# Patient Record
Sex: Male | Born: 1960 | Race: Black or African American | Hispanic: No | Marital: Married | Smoking: Never smoker
Health system: Southern US, Community
[De-identification: ages and names within clinical notes are randomized; demographics above are authoritative.]

## PROBLEM LIST (undated history)

## (undated) ENCOUNTER — Emergency Department (HOSPITAL_COMMUNITY): Admission: EM | Payer: BLUE CROSS/BLUE SHIELD | Source: Home / Self Care

## (undated) DIAGNOSIS — N529 Male erectile dysfunction, unspecified: Secondary | ICD-10-CM

## (undated) DIAGNOSIS — E785 Hyperlipidemia, unspecified: Secondary | ICD-10-CM

## (undated) DIAGNOSIS — E1159 Type 2 diabetes mellitus with other circulatory complications: Secondary | ICD-10-CM

## (undated) DIAGNOSIS — R7611 Nonspecific reaction to tuberculin skin test without active tuberculosis: Secondary | ICD-10-CM

## (undated) DIAGNOSIS — Z992 Dependence on renal dialysis: Secondary | ICD-10-CM

## (undated) DIAGNOSIS — I10 Essential (primary) hypertension: Secondary | ICD-10-CM

## (undated) DIAGNOSIS — C61 Malignant neoplasm of prostate: Secondary | ICD-10-CM

## (undated) DIAGNOSIS — N186 End stage renal disease: Secondary | ICD-10-CM

## (undated) DIAGNOSIS — K219 Gastro-esophageal reflux disease without esophagitis: Secondary | ICD-10-CM

## (undated) DIAGNOSIS — N19 Unspecified kidney failure: Secondary | ICD-10-CM

## (undated) DIAGNOSIS — E669 Obesity, unspecified: Secondary | ICD-10-CM

## (undated) DIAGNOSIS — I272 Pulmonary hypertension, unspecified: Secondary | ICD-10-CM

## (undated) DIAGNOSIS — Z794 Long term (current) use of insulin: Secondary | ICD-10-CM

## (undated) DIAGNOSIS — E11319 Type 2 diabetes mellitus with unspecified diabetic retinopathy without macular edema: Secondary | ICD-10-CM

## (undated) DIAGNOSIS — N2889 Other specified disorders of kidney and ureter: Secondary | ICD-10-CM

## (undated) DIAGNOSIS — I2510 Atherosclerotic heart disease of native coronary artery without angina pectoris: Secondary | ICD-10-CM

## (undated) DIAGNOSIS — N185 Chronic kidney disease, stage 5: Secondary | ICD-10-CM

## (undated) DIAGNOSIS — D649 Anemia, unspecified: Secondary | ICD-10-CM

## (undated) DIAGNOSIS — E1122 Type 2 diabetes mellitus with diabetic chronic kidney disease: Secondary | ICD-10-CM

## (undated) DIAGNOSIS — I5022 Chronic systolic (congestive) heart failure: Secondary | ICD-10-CM

## (undated) DIAGNOSIS — G629 Polyneuropathy, unspecified: Secondary | ICD-10-CM

## (undated) DIAGNOSIS — I110 Hypertensive heart disease with heart failure: Secondary | ICD-10-CM

## (undated) DIAGNOSIS — R0600 Dyspnea, unspecified: Secondary | ICD-10-CM

## (undated) DIAGNOSIS — I428 Other cardiomyopathies: Secondary | ICD-10-CM

## (undated) DIAGNOSIS — N289 Disorder of kidney and ureter, unspecified: Secondary | ICD-10-CM

## (undated) HISTORY — DX: Chronic systolic (congestive) heart failure: I50.22

## (undated) HISTORY — DX: Other cardiomyopathies: I42.8

## (undated) HISTORY — DX: Dependence on renal dialysis: N18.6

## (undated) HISTORY — PX: KNEE CARTILAGE SURGERY: SHX688

## (undated) HISTORY — DX: Dyspnea, unspecified: R06.00

## (undated) HISTORY — DX: End stage renal disease: N18.6

## (undated) HISTORY — DX: Malignant neoplasm of prostate: C61

## (undated) HISTORY — DX: Nonspecific reaction to tuberculin skin test without active tuberculosis: R76.11

## (undated) HISTORY — DX: Atherosclerotic heart disease of native coronary artery without angina pectoris: I25.10

## (undated) HISTORY — DX: Chronic kidney disease, stage 5: N18.5

## (undated) HISTORY — DX: Other specified disorders of kidney and ureter: N28.89

## (undated) HISTORY — DX: Polyneuropathy, unspecified: G62.9

## (undated) HISTORY — DX: Type 2 diabetes mellitus with other circulatory complications: E11.59

## (undated) HISTORY — DX: Unspecified kidney failure: N19

## (undated) HISTORY — DX: Type 2 diabetes mellitus with unspecified diabetic retinopathy without macular edema: E11.319

## (undated) HISTORY — DX: Long term (current) use of insulin: Z79.4

## (undated) HISTORY — DX: Obesity, unspecified: E66.9

## (undated) HISTORY — DX: Hyperlipidemia, unspecified: E78.5

## (undated) HISTORY — PX: COLONOSCOPY: SHX174

## (undated) HISTORY — DX: Hypertensive heart disease with heart failure: I11.0

## (undated) HISTORY — DX: Disorder of kidney and ureter, unspecified: N28.9

## (undated) HISTORY — DX: Essential (primary) hypertension: I10

## (undated) HISTORY — PX: FRACTURE SURGERY: SHX138

## (undated) HISTORY — PX: VASECTOMY: SHX75

## (undated) HISTORY — DX: Male erectile dysfunction, unspecified: N52.9

## (undated) HISTORY — DX: Type 2 diabetes mellitus with diabetic chronic kidney disease: E11.22

## (undated) HISTORY — DX: Dependence on renal dialysis: Z99.2

## (undated) HISTORY — DX: End stage renal disease: Z99.2

## (undated) HISTORY — DX: Anemia, unspecified: D64.9

---

## 1997-11-23 ENCOUNTER — Encounter: Admission: RE | Admit: 1997-11-23 | Discharge: 1998-02-21 | Payer: Self-pay | Admitting: *Deleted

## 1998-12-09 ENCOUNTER — Encounter: Payer: Self-pay | Admitting: Emergency Medicine

## 1998-12-09 ENCOUNTER — Encounter: Payer: Self-pay | Admitting: Family Medicine

## 1998-12-09 ENCOUNTER — Inpatient Hospital Stay (HOSPITAL_COMMUNITY): Admission: EM | Admit: 1998-12-09 | Discharge: 1998-12-20 | Payer: Self-pay | Admitting: Emergency Medicine

## 1998-12-10 ENCOUNTER — Encounter: Payer: Self-pay | Admitting: Family Medicine

## 1998-12-12 ENCOUNTER — Encounter: Payer: Self-pay | Admitting: Infectious Diseases

## 1998-12-15 ENCOUNTER — Encounter: Payer: Self-pay | Admitting: Family Medicine

## 1999-01-12 ENCOUNTER — Encounter: Admission: RE | Admit: 1999-01-12 | Discharge: 1999-04-12 | Payer: Self-pay | Admitting: Family Medicine

## 2001-06-18 ENCOUNTER — Emergency Department (HOSPITAL_COMMUNITY): Admission: EM | Admit: 2001-06-18 | Discharge: 2001-06-18 | Payer: Self-pay | Admitting: Emergency Medicine

## 2003-10-10 ENCOUNTER — Emergency Department (HOSPITAL_COMMUNITY): Admission: EM | Admit: 2003-10-10 | Discharge: 2003-10-10 | Payer: Self-pay | Admitting: Internal Medicine

## 2005-07-15 ENCOUNTER — Emergency Department (HOSPITAL_COMMUNITY): Admission: EM | Admit: 2005-07-15 | Discharge: 2005-07-15 | Payer: Self-pay | Admitting: Family Medicine

## 2005-07-19 ENCOUNTER — Ambulatory Visit: Payer: Self-pay | Admitting: Family Medicine

## 2006-06-06 ENCOUNTER — Ambulatory Visit: Payer: Self-pay | Admitting: Family Medicine

## 2006-07-05 ENCOUNTER — Ambulatory Visit: Payer: Self-pay | Admitting: Family Medicine

## 2006-11-28 ENCOUNTER — Ambulatory Visit: Payer: Self-pay | Admitting: Family Medicine

## 2006-11-29 ENCOUNTER — Ambulatory Visit: Payer: Self-pay | Admitting: Family Medicine

## 2007-07-25 ENCOUNTER — Ambulatory Visit: Payer: Self-pay | Admitting: Family Medicine

## 2007-08-08 ENCOUNTER — Ambulatory Visit: Payer: Self-pay | Admitting: Family Medicine

## 2007-08-13 ENCOUNTER — Ambulatory Visit: Payer: Self-pay | Admitting: Family Medicine

## 2007-08-13 ENCOUNTER — Encounter: Admission: RE | Admit: 2007-08-13 | Discharge: 2007-08-13 | Payer: Self-pay | Admitting: Family Medicine

## 2007-11-27 ENCOUNTER — Ambulatory Visit: Payer: Self-pay | Admitting: Family Medicine

## 2007-12-10 ENCOUNTER — Ambulatory Visit: Payer: Self-pay | Admitting: Family Medicine

## 2008-01-30 ENCOUNTER — Encounter: Admission: RE | Admit: 2008-01-30 | Discharge: 2008-01-30 | Payer: Self-pay | Admitting: Family Medicine

## 2008-04-29 ENCOUNTER — Ambulatory Visit: Payer: Self-pay | Admitting: Family Medicine

## 2008-05-04 ENCOUNTER — Ambulatory Visit: Payer: Self-pay | Admitting: Family Medicine

## 2008-10-28 ENCOUNTER — Ambulatory Visit: Payer: Self-pay | Admitting: Family Medicine

## 2008-11-04 ENCOUNTER — Ambulatory Visit: Payer: Self-pay | Admitting: Family Medicine

## 2008-12-03 ENCOUNTER — Ambulatory Visit: Payer: Self-pay | Admitting: Family Medicine

## 2008-12-11 ENCOUNTER — Ambulatory Visit: Payer: Self-pay | Admitting: Family Medicine

## 2008-12-23 ENCOUNTER — Ambulatory Visit: Payer: Self-pay | Admitting: Family Medicine

## 2009-01-01 ENCOUNTER — Ambulatory Visit: Payer: Self-pay | Admitting: Family Medicine

## 2009-02-11 ENCOUNTER — Ambulatory Visit: Payer: Self-pay | Admitting: Family Medicine

## 2009-02-13 HISTORY — PX: PROSTATECTOMY: SHX69

## 2009-02-17 ENCOUNTER — Ambulatory Visit: Payer: Self-pay | Admitting: Family Medicine

## 2009-04-07 ENCOUNTER — Ambulatory Visit: Payer: Self-pay | Admitting: Family Medicine

## 2009-12-01 ENCOUNTER — Ambulatory Visit (HOSPITAL_COMMUNITY): Admission: RE | Admit: 2009-12-01 | Discharge: 2009-12-01 | Payer: Self-pay | Admitting: Urology

## 2009-12-06 ENCOUNTER — Ambulatory Visit: Payer: Self-pay | Admitting: Family Medicine

## 2010-01-21 ENCOUNTER — Encounter (INDEPENDENT_AMBULATORY_CARE_PROVIDER_SITE_OTHER): Payer: Self-pay | Admitting: Urology

## 2010-01-21 ENCOUNTER — Inpatient Hospital Stay (HOSPITAL_COMMUNITY)
Admission: RE | Admit: 2010-01-21 | Discharge: 2010-01-22 | Payer: Self-pay | Source: Home / Self Care | Attending: Urology | Admitting: Urology

## 2010-04-12 ENCOUNTER — Ambulatory Visit (INDEPENDENT_AMBULATORY_CARE_PROVIDER_SITE_OTHER): Payer: 59 | Admitting: Family Medicine

## 2010-04-12 DIAGNOSIS — L0390 Cellulitis, unspecified: Secondary | ICD-10-CM

## 2010-04-26 LAB — COMPREHENSIVE METABOLIC PANEL
AST: 28 U/L (ref 0–37)
BUN: 20 mg/dL (ref 6–23)
CO2: 28 mEq/L (ref 19–32)
Calcium: 9.9 mg/dL (ref 8.4–10.5)
Chloride: 100 mEq/L (ref 96–112)
Creatinine, Ser: 1.24 mg/dL (ref 0.4–1.5)
GFR calc non Af Amer: >60 mL/min (ref >60)
Glucose, Bld: 66 mg/dL — ABNORMAL LOW (ref 70–99)
Total Bilirubin: 0.6 mg/dL (ref 0.3–1.2)

## 2010-04-26 LAB — BASIC METABOLIC PANEL
BUN: 11 mg/dL (ref 6–23)
CO2: 26 mEq/L (ref 19–32)
Calcium: 8.2 mg/dL — ABNORMAL LOW (ref 8.4–10.5)
Calcium: 8.4 mg/dL (ref 8.4–10.5)
GFR calc Af Amer: >60 mL/min (ref >60)
GFR calc non Af Amer: 54 mL/min — ABNORMAL LOW (ref >60)
GFR calc non Af Amer: >60 mL/min (ref >60)
Glucose, Bld: 144 mg/dL — ABNORMAL HIGH (ref 70–99)
Glucose, Bld: 192 mg/dL — ABNORMAL HIGH (ref 70–99)
Potassium: 4.1 mEq/L (ref 3.5–5.1)
Sodium: 136 mEq/L (ref 135–145)

## 2010-04-26 LAB — GLUCOSE, CAPILLARY
Glucose-Capillary: 162 mg/dL — ABNORMAL HIGH (ref 70–99)
Glucose-Capillary: 162 mg/dL — ABNORMAL HIGH (ref 70–99)
Glucose-Capillary: 165 mg/dL — ABNORMAL HIGH (ref 70–99)
Glucose-Capillary: 183 mg/dL — ABNORMAL HIGH (ref 70–99)
Glucose-Capillary: 196 mg/dL — ABNORMAL HIGH (ref 70–99)
Glucose-Capillary: 214 mg/dL — ABNORMAL HIGH (ref 70–99)

## 2010-04-26 LAB — CBC
HCT: 34 % — ABNORMAL LOW (ref 39.0–52.0)
HCT: 39.4 % (ref 39.0–52.0)
Hemoglobin: 10.9 g/dL — ABNORMAL LOW (ref 13.0–17.0)
Hemoglobin: 13.2 g/dL (ref 13.0–17.0)
MCH: 24.8 pg — ABNORMAL LOW (ref 26.0–34.0)
MCH: 25.2 pg — ABNORMAL LOW (ref 26.0–34.0)
MCHC: 31.8 g/dL (ref 30.0–36.0)
MCHC: 33.5 g/dL (ref 30.0–36.0)
MCV: 75.2 fL — ABNORMAL LOW (ref 78.0–100.0)
Platelets: 244 10*3/uL (ref 150–400)
RBC: 4.4 MIL/uL (ref 4.22–5.81)
RBC: 5.24 MIL/uL (ref 4.22–5.81)
RDW: 15 % (ref 11.5–15.5)
WBC: 9.7 10*3/uL (ref 4.0–10.5)

## 2010-04-26 LAB — ABO/RH: ABO/RH(D): A POS

## 2010-04-26 LAB — DIFFERENTIAL
Basophils Absolute: 0 10*3/uL (ref 0.0–0.1)
Basophils Relative: 0 % (ref 0–1)
Lymphocytes Relative: 20 % (ref 12–46)
Monocytes Absolute: 0.8 10*3/uL (ref 0.1–1.0)
Neutro Abs: 6.8 10*3/uL (ref 1.7–7.7)
Neutrophils Relative %: 70 % (ref 43–77)

## 2010-04-26 LAB — TYPE AND SCREEN: ABO/RH(D): A POS

## 2010-04-26 LAB — SURGICAL PCR SCREEN: Staphylococcus aureus: NEGATIVE

## 2010-04-27 LAB — CBC
HCT: 39.7 % (ref 39.0–52.0)
Hemoglobin: 13.2 g/dL (ref 13.0–17.0)
MCH: 25.5 pg — ABNORMAL LOW (ref 26.0–34.0)
RBC: 5.17 MIL/uL (ref 4.22–5.81)

## 2010-04-27 LAB — COMPREHENSIVE METABOLIC PANEL
Albumin: 3.1 g/dL — ABNORMAL LOW (ref 3.5–5.2)
BUN: 22 mg/dL (ref 6–23)
Calcium: 9.4 mg/dL (ref 8.4–10.5)
Chloride: 90 mEq/L — ABNORMAL LOW (ref 96–112)
Creatinine, Ser: 1.63 mg/dL — ABNORMAL HIGH (ref 0.4–1.5)
Total Bilirubin: 0.9 mg/dL (ref 0.3–1.2)

## 2010-04-27 LAB — SURGICAL PCR SCREEN
MRSA, PCR: NEGATIVE
Staphylococcus aureus: NEGATIVE

## 2010-07-04 ENCOUNTER — Ambulatory Visit
Admission: RE | Admit: 2010-07-04 | Discharge: 2010-07-04 | Disposition: A | Discharge status: Home / Self Care | Payer: Self-pay | Source: Ambulatory Visit | Attending: Radiation Oncology | Admitting: Radiation Oncology

## 2010-07-04 DIAGNOSIS — E7800 Pure hypercholesterolemia, unspecified: Secondary | ICD-10-CM | POA: Insufficient documentation

## 2010-07-04 DIAGNOSIS — Z7982 Long term (current) use of aspirin: Secondary | ICD-10-CM | POA: Insufficient documentation

## 2010-07-04 DIAGNOSIS — C61 Malignant neoplasm of prostate: Secondary | ICD-10-CM | POA: Insufficient documentation

## 2010-07-04 DIAGNOSIS — I10 Essential (primary) hypertension: Secondary | ICD-10-CM | POA: Insufficient documentation

## 2010-07-04 DIAGNOSIS — E119 Type 2 diabetes mellitus without complications: Secondary | ICD-10-CM | POA: Insufficient documentation

## 2010-07-04 DIAGNOSIS — Z79899 Other long term (current) drug therapy: Secondary | ICD-10-CM | POA: Insufficient documentation

## 2010-07-04 DIAGNOSIS — M129 Arthropathy, unspecified: Secondary | ICD-10-CM | POA: Insufficient documentation

## 2010-07-04 DIAGNOSIS — R32 Unspecified urinary incontinence: Secondary | ICD-10-CM | POA: Insufficient documentation

## 2010-07-05 ENCOUNTER — Telehealth: Payer: Self-pay | Admitting: Family Medicine

## 2010-07-05 NOTE — Telephone Encounter (Signed)
Pt called, lost job, requested any samples.  Per [employer] gave samples Exforge 5/320, Crestor 20mg , Humalog  & Levimer-LM

## 2010-09-20 ENCOUNTER — Other Ambulatory Visit: Payer: Self-pay | Admitting: Family Medicine

## 2010-09-22 ENCOUNTER — Telehealth: Payer: Self-pay | Admitting: Family Medicine

## 2010-09-22 NOTE — Telephone Encounter (Signed)
Help him out if we can

## 2010-09-22 NOTE — Telephone Encounter (Signed)
Pt called needs samples of Crestor, Exforge, Humalog, Levamir.  No insurance, no job.

## 2010-10-03 ENCOUNTER — Ambulatory Visit
Admission: RE | Admit: 2010-10-03 | Discharge: 2010-10-03 | Disposition: A | Discharge status: Home / Self Care | Payer: Self-pay | Source: Ambulatory Visit | Attending: Radiation Oncology | Admitting: Radiation Oncology

## 2010-10-03 DIAGNOSIS — C61 Malignant neoplasm of prostate: Secondary | ICD-10-CM | POA: Insufficient documentation

## 2010-10-10 ENCOUNTER — Encounter: Payer: Self-pay | Admitting: Family Medicine

## 2010-10-21 ENCOUNTER — Telehealth: Payer: Self-pay | Admitting: Family Medicine

## 2010-10-21 NOTE — Telephone Encounter (Signed)
Med in fridge and we dont have exforge

## 2010-10-21 NOTE — Telephone Encounter (Signed)
Pt would like samples of humalog/crestor/exforge/levemir. Please leave what we have on back door at end of day. Pt will will not be able to pick up until after work hours.

## 2010-12-27 ENCOUNTER — Telehealth: Payer: Self-pay | Admitting: Internal Medicine

## 2010-12-27 NOTE — Telephone Encounter (Signed)
Pt will come in and pick up samples

## 2010-12-27 NOTE — Telephone Encounter (Signed)
Pt would like to know if he could have samples of humalog insulin and levemir insulin. He lost his job and can't afford them and has no insurance

## 2010-12-27 NOTE — Telephone Encounter (Signed)
Give him some insulin if we have any

## 2011-02-10 ENCOUNTER — Telehealth: Payer: Self-pay | Admitting: Family Medicine

## 2011-02-16 NOTE — Telephone Encounter (Signed)
Pt given samples  

## 2011-02-17 ENCOUNTER — Other Ambulatory Visit: Payer: Self-pay

## 2011-02-17 MED ORDER — INSULIN DETEMIR 100 UNIT/ML ~~LOC~~ SOLN: 50.0000 [IU] | Freq: Two times a day (BID) | SUBCUTANEOUS | Status: DC

## 2011-03-14 ENCOUNTER — Encounter: Payer: Self-pay | Admitting: Family Medicine

## 2011-03-14 ENCOUNTER — Ambulatory Visit (INDEPENDENT_AMBULATORY_CARE_PROVIDER_SITE_OTHER): Payer: Self-pay | Admitting: Family Medicine

## 2011-03-14 DIAGNOSIS — E119 Type 2 diabetes mellitus without complications: Secondary | ICD-10-CM

## 2011-03-14 LAB — BASIC METABOLIC PANEL
BUN: 21 mg/dL (ref 6–23)
Chloride: 89 mEq/L — ABNORMAL LOW (ref 96–112)
Potassium: 4.9 mEq/L (ref 3.5–5.3)

## 2011-03-14 NOTE — Progress Notes (Signed)
  Subjective:    Patient ID: Austin Kramer, male    DOB: 07/24/1960, 51 y.o.   MRN: [id number]  HPI He is here for evaluation of diabetes. He has been out of job for several months and has been cutting back on his insulin dosing. Over last several weeks he has cut back extensively and in the last week has essentially run out. He does complain of polyuria and polydipsia.   Review of Systems     Objective:   Physical Exam Alert and in no distress. No tachypnea is noted. Fingerstick blood sugar was over 500. A segment of bile did show a blood sugar of 673. Sodium 129, potassium 4.9, CO2 30, chloride 89. Renal function is normal.       Assessment & Plan:   1. Diabetes mellitus  Basic Metabolic Panel (BMET)   I had a long discussion with him concerning his diabetes and insulin. We will attempt to get him medications a less expensive price. He is to start back on his Levemir and regular insulin. He is keeping active his blood sugars and call me in one week.

## 2011-03-15 ENCOUNTER — Encounter: Payer: Self-pay | Admitting: *Deleted

## 2011-03-15 NOTE — Progress Notes (Signed)
I-PPS 07/04/10=[id number] score=14 IIEF 5/21/112=[id number]

## 2011-03-27 ENCOUNTER — Encounter: Payer: Self-pay | Admitting: Family Medicine

## 2011-03-27 ENCOUNTER — Ambulatory Visit (INDEPENDENT_AMBULATORY_CARE_PROVIDER_SITE_OTHER): Payer: Self-pay | Admitting: Family Medicine

## 2011-03-27 DIAGNOSIS — E1159 Type 2 diabetes mellitus with other circulatory complications: Secondary | ICD-10-CM

## 2011-03-27 DIAGNOSIS — E785 Hyperlipidemia, unspecified: Secondary | ICD-10-CM

## 2011-03-27 DIAGNOSIS — I152 Hypertension secondary to endocrine disorders: Secondary | ICD-10-CM

## 2011-03-27 DIAGNOSIS — E669 Obesity, unspecified: Secondary | ICD-10-CM | POA: Insufficient documentation

## 2011-03-27 DIAGNOSIS — I10 Essential (primary) hypertension: Secondary | ICD-10-CM

## 2011-03-27 DIAGNOSIS — E1122 Type 2 diabetes mellitus with diabetic chronic kidney disease: Secondary | ICD-10-CM

## 2011-03-27 DIAGNOSIS — E1169 Type 2 diabetes mellitus with other specified complication: Secondary | ICD-10-CM

## 2011-03-27 DIAGNOSIS — E119 Type 2 diabetes mellitus without complications: Secondary | ICD-10-CM

## 2011-03-27 HISTORY — DX: Type 2 diabetes mellitus with diabetic chronic kidney disease: E11.22

## 2011-03-27 HISTORY — DX: Type 2 diabetes mellitus with other circulatory complications: E11.59

## 2011-03-27 HISTORY — DX: Hyperlipidemia, unspecified: E78.5

## 2011-03-27 HISTORY — DX: Hypertension secondary to endocrine disorders: I15.2

## 2011-03-27 NOTE — Progress Notes (Signed)
  Subjective:    Patient ID: Austin Kramer, male    DOB: 1960-11-03, 51 y.o.   MRN: [id number]  HPI He is here for recheck. He is now back on his left ear and Humalog. In the last several days his blood sugars have finally gotten below 200. Helped through [employer].   Review of Systems     Objective:   Physical Exam Alert and in no distress otherwise not examined       Assessment & Plan:   1. Diabetes mellitus   2. Hypertension associated with diabetes   3. Hyperlipidemia LDL goal <70   4. Obesity (BMI 30-39.9)    he will continue to work on getting his insulin dosing. He is to call if he runs out to see if we have any samples. Otherwise I will see him in 4 months

## 2011-04-16 ENCOUNTER — Other Ambulatory Visit: Payer: Self-pay | Admitting: Family Medicine

## 2011-04-17 NOTE — Telephone Encounter (Signed)
Is this ok?

## 2011-05-01 ENCOUNTER — Other Ambulatory Visit: Payer: Self-pay

## 2011-05-01 MED ORDER — INSULIN LISPRO 100 UNIT/ML ~~LOC~~ SOLN: 25.0000 [IU] | Freq: Every day | SUBCUTANEOUS | Status: DC

## 2011-05-01 MED ORDER — INSULIN DETEMIR 100 UNIT/ML ~~LOC~~ SOLN: 50.0000 [IU] | Freq: Two times a day (BID) | SUBCUTANEOUS | Status: DC

## 2011-05-01 MED ORDER — ROSUVASTATIN CALCIUM 20 MG PO TABS: 20.0000 mg | ORAL_TABLET | Freq: Every day | ORAL | Status: DC

## 2011-05-26 ENCOUNTER — Telehealth: Payer: Self-pay | Admitting: Family Medicine

## 2011-05-26 MED ORDER — INSULIN DETEMIR 100 UNIT/ML ~~LOC~~ SOLN: 50.0000 [IU] | Freq: Two times a day (BID) | SUBCUTANEOUS | Status: DC

## 2011-05-26 NOTE — Telephone Encounter (Signed)
Called patient and let him know that we have 2 pens, I will leave at the back door for him.

## 2011-05-26 NOTE — Telephone Encounter (Signed)
Wants Levamir samples.   Please call.   Please leave on back door if we door

## 2011-05-26 NOTE — Telephone Encounter (Signed)
If we had been Levemir he can have an

## 2011-06-07 ENCOUNTER — Other Ambulatory Visit: Payer: Self-pay

## 2011-06-07 MED ORDER — INSULIN DETEMIR 100 UNIT/ML ~~LOC~~ SOLN: 50.0000 [IU] | Freq: Two times a day (BID) | SUBCUTANEOUS | Status: DC

## 2011-06-07 MED ORDER — INSULIN LISPRO 100 UNIT/ML ~~LOC~~ SOLN: 25.0000 [IU] | Freq: Every day | SUBCUTANEOUS | Status: DC

## 2011-06-07 NOTE — Telephone Encounter (Signed)
Pt informed samples in refrige.

## 2011-06-23 ENCOUNTER — Other Ambulatory Visit: Payer: Self-pay

## 2011-06-23 MED ORDER — INSULIN LISPRO 100 UNIT/ML ~~LOC~~ SOLN: 25.0000 [IU] | Freq: Every day | SUBCUTANEOUS | Status: DC

## 2011-06-23 MED ORDER — INSULIN DETEMIR 100 UNIT/ML ~~LOC~~ SOLN: 50.0000 [IU] | Freq: Two times a day (BID) | SUBCUTANEOUS | Status: DC

## 2011-06-23 NOTE — Telephone Encounter (Signed)
Gave samples due to no ins.

## 2011-08-03 ENCOUNTER — Other Ambulatory Visit: Payer: Self-pay

## 2011-08-03 MED ORDER — INSULIN DETEMIR 100 UNIT/ML ~~LOC~~ SOLN: 50.0000 [IU] | Freq: Two times a day (BID) | SUBCUTANEOUS | Status: DC

## 2011-08-03 MED ORDER — INSULIN LISPRO 100 UNIT/ML ~~LOC~~ SOLN: 25.0000 [IU] | Freq: Every day | SUBCUTANEOUS | Status: DC

## 2011-08-03 MED ORDER — ROSUVASTATIN CALCIUM 20 MG PO TABS: 20.0000 mg | ORAL_TABLET | Freq: Every day | ORAL | Status: DC

## 2011-09-11 ENCOUNTER — Encounter: Payer: Self-pay | Admitting: Family Medicine

## 2011-09-11 ENCOUNTER — Ambulatory Visit (INDEPENDENT_AMBULATORY_CARE_PROVIDER_SITE_OTHER): Payer: Self-pay | Admitting: Family Medicine

## 2011-09-11 VITALS — BP 150/90 | HR 106 | Wt 276.0 lb

## 2011-09-11 DIAGNOSIS — E1142 Type 2 diabetes mellitus with diabetic polyneuropathy: Secondary | ICD-10-CM

## 2011-09-11 DIAGNOSIS — E1140 Type 2 diabetes mellitus with diabetic neuropathy, unspecified: Secondary | ICD-10-CM

## 2011-09-11 DIAGNOSIS — E785 Hyperlipidemia, unspecified: Secondary | ICD-10-CM

## 2011-09-11 DIAGNOSIS — E1149 Type 2 diabetes mellitus with other diabetic neurological complication: Secondary | ICD-10-CM

## 2011-09-11 DIAGNOSIS — E669 Obesity, unspecified: Secondary | ICD-10-CM

## 2011-09-11 DIAGNOSIS — I10 Essential (primary) hypertension: Secondary | ICD-10-CM

## 2011-09-11 DIAGNOSIS — E119 Type 2 diabetes mellitus without complications: Secondary | ICD-10-CM

## 2011-09-11 DIAGNOSIS — E1169 Type 2 diabetes mellitus with other specified complication: Secondary | ICD-10-CM

## 2011-09-11 MED ORDER — AMITRIPTYLINE HCL 10 MG PO TABS: 10.0000 mg | ORAL_TABLET | Freq: Every day | ORAL | Status: DC

## 2011-09-11 NOTE — Progress Notes (Signed)
  Subjective:    Patient ID: Austin Kramer, male    DOB: November 16, 1960, 51 y.o.   MRN: [id number]  HPI He has a two-week history of difficulty with tingling in his feet. No pain or weakness. He continues on medications listed in the chart and he is having no difficulty from him.Marland Kitchen He does state that when he sees his blood sugar up to high, he will give himself extra Levemir which she then states does tend to drop significantly. He is in between jobs right now. His social and family history were reviewed.   Review of Systems     Objective:   Physical Exam Alert and in no distress otherwise not examined. He will A1c is 10.2.      Assessment & Plan:   1. Diabetes mellitus    2. Hyperlipidemia LDL goal <70    3. Hypertension associated with diabetes    4. Obesity (BMI 30-39.9)    5. Diabetic neuropathy, type II diabetes mellitus  amitriptyline (ELAVIL) 10 MG tablet  Keep your morning blood sugars between 100 to120. Do not adjust  the Levimer except on these numbers. If you have elevated numbers during the day then you can use the Humalog

## 2011-09-11 NOTE — Patient Instructions (Addendum)
Keep your morning blood sugars between 100 to120. Do not adjust of the [place] except on these numbers. If you have elevated numbers during the day then you can use the Humalog

## 2011-10-26 ENCOUNTER — Other Ambulatory Visit: Payer: Self-pay

## 2011-10-26 MED ORDER — ROSUVASTATIN CALCIUM 20 MG PO TABS: 20.0000 mg | ORAL_TABLET | Freq: Every day | ORAL | Status: DC

## 2011-10-26 MED ORDER — INSULIN LISPRO 100 UNIT/ML ~~LOC~~ SOLN: 25.0000 [IU] | Freq: Every day | SUBCUTANEOUS | Status: DC

## 2011-10-26 NOTE — Telephone Encounter (Signed)
Med on back porch for pt

## 2011-12-26 ENCOUNTER — Telehealth: Payer: Self-pay | Admitting: Family Medicine

## 2011-12-26 MED ORDER — INSULIN LISPRO 100 UNIT/ML ~~LOC~~ SOLN: 25.0000 [IU] | Freq: Every day | SUBCUTANEOUS | Status: DC

## 2011-12-26 MED ORDER — INSULIN DETEMIR 100 UNIT/ML ~~LOC~~ SOLN: 50.0000 [IU] | Freq: Two times a day (BID) | SUBCUTANEOUS | Status: DC

## 2011-12-26 NOTE — Telephone Encounter (Signed)
Please call concerning medicine, he still has no insurance and wants to see if we have samples   Wants samples of levimir,and  humalog

## 2011-12-26 NOTE — Telephone Encounter (Signed)
PT ADVISED WE HAVE Samples for him

## 2012-01-10 ENCOUNTER — Encounter (HOSPITAL_COMMUNITY): Payer: Self-pay

## 2012-01-10 ENCOUNTER — Observation Stay (HOSPITAL_COMMUNITY): Payer: No Typology Code available for payment source

## 2012-01-10 ENCOUNTER — Inpatient Hospital Stay (HOSPITAL_COMMUNITY)
Admission: EM | Admit: 2012-01-10 | Discharge: 2012-01-12 | DRG: 603 | Disposition: A | Discharge status: Home / Self Care | Payer: No Typology Code available for payment source | Attending: Internal Medicine | Admitting: Internal Medicine

## 2012-01-10 DIAGNOSIS — L0390 Cellulitis, unspecified: Secondary | ICD-10-CM | POA: Diagnosis present

## 2012-01-10 DIAGNOSIS — E1149 Type 2 diabetes mellitus with other diabetic neurological complication: Secondary | ICD-10-CM | POA: Diagnosis present

## 2012-01-10 DIAGNOSIS — D72829 Elevated white blood cell count, unspecified: Secondary | ICD-10-CM | POA: Diagnosis present

## 2012-01-10 DIAGNOSIS — E1122 Type 2 diabetes mellitus with diabetic chronic kidney disease: Secondary | ICD-10-CM | POA: Diagnosis present

## 2012-01-10 DIAGNOSIS — IMO0002 Reserved for concepts with insufficient information to code with codable children: Secondary | ICD-10-CM | POA: Diagnosis present

## 2012-01-10 DIAGNOSIS — Z9079 Acquired absence of other genital organ(s): Secondary | ICD-10-CM

## 2012-01-10 DIAGNOSIS — Z79899 Other long term (current) drug therapy: Secondary | ICD-10-CM

## 2012-01-10 DIAGNOSIS — E785 Hyperlipidemia, unspecified: Secondary | ICD-10-CM

## 2012-01-10 DIAGNOSIS — L0291 Cutaneous abscess, unspecified: Secondary | ICD-10-CM

## 2012-01-10 DIAGNOSIS — Z8546 Personal history of malignant neoplasm of prostate: Secondary | ICD-10-CM

## 2012-01-10 DIAGNOSIS — N289 Disorder of kidney and ureter, unspecified: Secondary | ICD-10-CM

## 2012-01-10 DIAGNOSIS — E1159 Type 2 diabetes mellitus with other circulatory complications: Secondary | ICD-10-CM | POA: Diagnosis present

## 2012-01-10 DIAGNOSIS — E1169 Type 2 diabetes mellitus with other specified complication: Secondary | ICD-10-CM

## 2012-01-10 DIAGNOSIS — E1165 Type 2 diabetes mellitus with hyperglycemia: Secondary | ICD-10-CM | POA: Diagnosis present

## 2012-01-10 DIAGNOSIS — M86679 Other chronic osteomyelitis, unspecified ankle and foot: Secondary | ICD-10-CM | POA: Diagnosis present

## 2012-01-10 DIAGNOSIS — E119 Type 2 diabetes mellitus without complications: Secondary | ICD-10-CM

## 2012-01-10 DIAGNOSIS — I10 Essential (primary) hypertension: Secondary | ICD-10-CM | POA: Diagnosis present

## 2012-01-10 DIAGNOSIS — E1142 Type 2 diabetes mellitus with diabetic polyneuropathy: Secondary | ICD-10-CM | POA: Diagnosis present

## 2012-01-10 DIAGNOSIS — M9080 Osteopathy in diseases classified elsewhere, unspecified site: Secondary | ICD-10-CM | POA: Diagnosis present

## 2012-01-10 DIAGNOSIS — E669 Obesity, unspecified: Secondary | ICD-10-CM | POA: Diagnosis present

## 2012-01-10 DIAGNOSIS — Z794 Long term (current) use of insulin: Secondary | ICD-10-CM

## 2012-01-10 DIAGNOSIS — E876 Hypokalemia: Secondary | ICD-10-CM

## 2012-01-10 DIAGNOSIS — N179 Acute kidney failure, unspecified: Secondary | ICD-10-CM | POA: Diagnosis present

## 2012-01-10 DIAGNOSIS — E871 Hypo-osmolality and hyponatremia: Secondary | ICD-10-CM

## 2012-01-10 DIAGNOSIS — E1140 Type 2 diabetes mellitus with diabetic neuropathy, unspecified: Secondary | ICD-10-CM

## 2012-01-10 DIAGNOSIS — L02619 Cutaneous abscess of unspecified foot: Principal | ICD-10-CM | POA: Diagnosis present

## 2012-01-10 DIAGNOSIS — Z6836 Body mass index (BMI) 36.0-36.9, adult: Secondary | ICD-10-CM

## 2012-01-10 LAB — BASIC METABOLIC PANEL
BUN: 19 mg/dL (ref 6–23)
Calcium: 8.9 mg/dL (ref 8.4–10.5)
Creatinine, Ser: 1.35 mg/dL (ref 0.50–1.35)
GFR calc non Af Amer: 59 mL/min — ABNORMAL LOW (ref >90)
Glucose, Bld: 195 mg/dL — ABNORMAL HIGH (ref 70–99)
Sodium: 135 mEq/L (ref 135–145)

## 2012-01-10 LAB — CBC WITH DIFFERENTIAL/PLATELET
Basophils Relative: 0 % (ref 0–1)
Eosinophils Absolute: 0 10*3/uL (ref 0.0–0.7)
Hemoglobin: 11.9 g/dL — ABNORMAL LOW (ref 13.0–17.0)
Lymphs Abs: 1.5 10*3/uL (ref 0.7–4.0)
MCH: 24.3 pg — ABNORMAL LOW (ref 26.0–34.0)
MCHC: 33.4 g/dL (ref 30.0–36.0)
MCV: 72.8 fL — ABNORMAL LOW (ref 78.0–100.0)
Monocytes Absolute: 0.9 10*3/uL (ref 0.1–1.0)
Neutrophils Relative %: 82 % — ABNORMAL HIGH (ref 43–77)

## 2012-01-10 LAB — GLUCOSE, CAPILLARY
Glucose-Capillary: 195 mg/dL — ABNORMAL HIGH (ref 70–99)
Glucose-Capillary: 210 mg/dL — ABNORMAL HIGH (ref 70–99)

## 2012-01-10 MED ORDER — AMLODIPINE BESYLATE-VALSARTAN 5-320 MG PO TABS: 1.0000 | ORAL_TABLET | Freq: Every day | ORAL | Status: DC

## 2012-01-10 MED ORDER — FERROUS SULFATE 325 (65 FE) MG PO TABS
325.0000 mg | ORAL_TABLET | Freq: Every day | ORAL | Status: DC
Start: 2012-01-11 — End: 2012-01-12
  Administered 2012-01-11 – 2012-01-12 (×2): 325 mg via ORAL
  Filled 2012-01-10 (×4): qty 1

## 2012-01-10 MED ORDER — VANCOMYCIN HCL 1000 MG IV SOLR
1250.0000 mg | Freq: Two times a day (BID) | INTRAVENOUS | Status: DC
  Administered 2012-01-11 – 2012-01-12 (×3): 1250 mg via INTRAVENOUS
  Filled 2012-01-10 (×4): qty 1250

## 2012-01-10 MED ORDER — IRBESARTAN 300 MG PO TABS
300.0000 mg | ORAL_TABLET | Freq: Every day | ORAL | Status: DC
  Administered 2012-01-10: 300 mg via ORAL
  Filled 2012-01-10 (×2): qty 1

## 2012-01-10 MED ORDER — INSULIN ASPART 100 UNIT/ML ~~LOC~~ SOLN
4.0000 [IU] | Freq: Three times a day (TID) | SUBCUTANEOUS | Status: DC
  Administered 2012-01-11 – 2012-01-12 (×4): 4 [IU] via SUBCUTANEOUS

## 2012-01-10 MED ORDER — PNEUMOCOCCAL VAC POLYVALENT 25 MCG/0.5ML IJ INJ
0.5000 mL | INJECTION | INTRAMUSCULAR | Status: AC
  Administered 2012-01-11: 0.5 mL via INTRAMUSCULAR
  Filled 2012-01-10: qty 0.5

## 2012-01-10 MED ORDER — IBUPROFEN 800 MG PO TABS: 400.0000 mg | ORAL_TABLET | Freq: Three times a day (TID) | ORAL | Status: DC | PRN

## 2012-01-10 MED ORDER — INSULIN ASPART 100 UNIT/ML ~~LOC~~ SOLN
0.0000 [IU] | Freq: Every day | SUBCUTANEOUS | Status: DC
  Administered 2012-01-10: 2 [IU] via SUBCUTANEOUS

## 2012-01-10 MED ORDER — CLINDAMYCIN PHOSPHATE 600 MG/50ML IV SOLN
600.0000 mg | Freq: Once | INTRAVENOUS | Status: AC
  Administered 2012-01-10: 600 mg via INTRAVENOUS
  Filled 2012-01-10: qty 50

## 2012-01-10 MED ORDER — ONDANSETRON HCL 4 MG/2ML IJ SOLN: 4.0000 mg | Freq: Four times a day (QID) | INTRAMUSCULAR | Status: DC | PRN

## 2012-01-10 MED ORDER — SODIUM CHLORIDE 0.9 % IV SOLN: 250.0000 mL | INTRAVENOUS | Status: DC | PRN

## 2012-01-10 MED ORDER — AMLODIPINE BESYLATE 5 MG PO TABS
5.0000 mg | ORAL_TABLET | Freq: Every day | ORAL | Status: DC
  Administered 2012-01-10 – 2012-01-12 (×3): 5 mg via ORAL
  Filled 2012-01-10 (×3): qty 1

## 2012-01-10 MED ORDER — AMITRIPTYLINE HCL 10 MG PO TABS
10.0000 mg | ORAL_TABLET | Freq: Every day | ORAL | Status: DC
  Administered 2012-01-10 – 2012-01-11 (×2): 10 mg via ORAL
  Filled 2012-01-10 (×3): qty 1

## 2012-01-10 MED ORDER — INSULIN ASPART 100 UNIT/ML ~~LOC~~ SOLN
0.0000 [IU] | Freq: Three times a day (TID) | SUBCUTANEOUS | Status: DC
  Administered 2012-01-10: 9 [IU] via SUBCUTANEOUS
  Administered 2012-01-11: 2 [IU] via SUBCUTANEOUS
  Administered 2012-01-12: 3 [IU] via SUBCUTANEOUS

## 2012-01-10 MED ORDER — INFLUENZA VIRUS VACC SPLIT PF IM SUSP
0.5000 mL | INTRAMUSCULAR | Status: AC
  Administered 2012-01-11: 0.5 mL via INTRAMUSCULAR
  Filled 2012-01-10: qty 0.5

## 2012-01-10 MED ORDER — TRIAMTERENE-HCTZ 37.5-25 MG PO TABS
1.0000 | ORAL_TABLET | Freq: Every day | ORAL | Status: DC
  Administered 2012-01-10: 1 via ORAL
  Filled 2012-01-10 (×2): qty 1

## 2012-01-10 MED ORDER — SODIUM CHLORIDE 0.9 % IJ SOLN
3.0000 mL | Freq: Two times a day (BID) | INTRAMUSCULAR | Status: DC
  Administered 2012-01-12: 3 mL via INTRAVENOUS

## 2012-01-10 MED ORDER — ONDANSETRON HCL 4 MG PO TABS: 4.0000 mg | ORAL_TABLET | Freq: Four times a day (QID) | ORAL | Status: DC | PRN

## 2012-01-10 MED ORDER — HYDROCODONE-ACETAMINOPHEN 5-325 MG PO TABS
1.0000 | ORAL_TABLET | ORAL | Status: DC | PRN
  Administered 2012-01-11 – 2012-01-12 (×4): 2 via ORAL
  Filled 2012-01-10 (×4): qty 2

## 2012-01-10 MED ORDER — SODIUM CHLORIDE 0.9 % IJ SOLN: 3.0000 mL | INTRAMUSCULAR | Status: DC | PRN

## 2012-01-10 MED ORDER — POLYETHYLENE GLYCOL 3350 17 G PO PACK
17.0000 g | PACK | Freq: Every day | ORAL | Status: DC | PRN
  Filled 2012-01-10: qty 1

## 2012-01-10 MED ORDER — SODIUM CHLORIDE 0.9 % IV SOLN
INTRAVENOUS | Status: AC
  Administered 2012-01-10: 19:00:00 via INTRAVENOUS

## 2012-01-10 MED ORDER — HEPARIN SODIUM (PORCINE) 5000 UNIT/ML IJ SOLN
5000.0000 [IU] | Freq: Three times a day (TID) | INTRAMUSCULAR | Status: DC
  Administered 2012-01-10 – 2012-01-12 (×5): 5000 [IU] via SUBCUTANEOUS
  Filled 2012-01-10 (×8): qty 1

## 2012-01-10 MED ORDER — INSULIN DETEMIR 100 UNIT/ML ~~LOC~~ SOLN
50.0000 [IU] | Freq: Two times a day (BID) | SUBCUTANEOUS | Status: DC
  Administered 2012-01-10 – 2012-01-12 (×4): 50 [IU] via SUBCUTANEOUS
  Filled 2012-01-10: qty 10

## 2012-01-10 MED ORDER — VANCOMYCIN HCL 1000 MG IV SOLR
2500.0000 mg | INTRAVENOUS | Status: AC
  Administered 2012-01-10: 2500 mg via INTRAVENOUS
  Filled 2012-01-10: qty 2500

## 2012-01-10 MED ORDER — ATORVASTATIN CALCIUM 10 MG PO TABS
10.0000 mg | ORAL_TABLET | Freq: Every day | ORAL | Status: DC
  Administered 2012-01-11: 10 mg via ORAL
  Filled 2012-01-10 (×2): qty 1

## 2012-01-10 NOTE — Care Management ED Note (Signed)
       CARE MANAGEMENT ED NOTE 01/10/2012  Patient:  Austin Kramer, Austin Kramer   Account Number:  [id number]  Date Initiated:  01/10/2012  Documentation initiated by:  Jackelyn Poling  Subjective/Objective Assessment:   51 yr old male 4 phcs Has recently started working after 2 years with new insurance coverage that he has not checked medication coverage for He has previously received medications at [facility] for $4 Admitted for observation     Subjective/Objective Assessment Detail:   dx hypergycemia cbg 29 CM consult for trouble affording medications  pt is not eligible for [facility] medication assistance program     Action/Plan:   Cm assessed concerns with trouble affording medications Cm discussed and provided written information for [email or web address], outpatient pharmacies, discounted pharmacies and financial resources in [employer]   Action/Plan Detail:   Cm encouraged to pt to check with is insurance coverage medication plan and resources Pt voiced appreciation and understanding of resources offered to him   Anticipated DC Date:  01/11/2012     Status Recommendation to Physician:   Result of Recommendation:    Other ED Services  Consult Working [place]  Other  Medication Assistance  Outpatient Services - Pt will follow up    Choice offered to / List presented to:            Status of service:  Completed, signed off  ED Comments:   ED Comments Detail:

## 2012-01-10 NOTE — H&P (Signed)
[facility] History and Physical  Austin Kramer [id number] DOB: 22-Oct-1960 DOA: 01/10/2012  Referring physician: Dr. Canary Brim PCP: Wyatt Haste, MD  Specialists: none  Chief Complaint: Right lower extremity pain  HPI: Austin Kramer is a 51 y.o. male  Past medical history of diabetes mellitus, unknown hemoglobin A1c, peripheral neuropathy. That comes in for lower extremity redness and pain. He relates this started a week prior to admission he hit his foot against a machine. Did not pay much attention. 4 days prior to admission he went to his primary care doctor who started him on empiric antibiotic. Referred him to a podiatrist. He went to his podiatrist today. An x-ray was done that did not show any osteomyelitis. He was sent over here as seemed it was not improving. The patient is total able to tolerate orals, relates some minor fevers, no chills nausea or vomiting.  Review of Systems: The patient denies anorexia, fever, weight loss,, vision loss, decreased hearing, hoarseness, chest pain, syncope, dyspnea on exertion, peripheral edema, balance deficits, hemoptysis, abdominal pain, melena, hematochezia, severe indigestion/heartburn, hematuria, incontinence, genital sores, muscle weakness, suspicious skin lesions, transient blindness, difficulty walking, depression, unusual weight change, abnormal bleeding, enlarged lymph nodes, angioedema, and breast masses.    Past Medical History  Diagnosis Date  . Diabetes mellitus   . Hypertension   . ED (erectile dysfunction)   . Obesity   . Cancer     PROSTATE  . PPD positive, treated    Past Surgical History  Procedure Date  . Vasectomy   . Prostatectomy 2011  . Knee cartilage surgery    Social History:  reports that he has never smoked. He has never used smokeless tobacco. He reports that he does not drink alcohol or use illicit drugs. His at home with wife can perform all his ADLs No Known Allergies  Family  History  Problem Relation Age of Onset  . Diabetes Mother   . Diabetes Father     Prior to Admission medications   Medication Sig Start Date End Date Taking? Authorizing Provider  amitriptyline (ELAVIL) 10 MG tablet Take 1 tablet (10 mg total) by mouth at bedtime. 09/11/11 09/10/12 Yes Denita Lung, MD  amLODipine-valsartan (EXFORGE) 5-320 MG per tablet Take 1 tablet by mouth daily.     Yes Historical Provider, MD  cephALEXin (KEFLEX) 500 MG capsule Take 500 mg by mouth 2 (two) times daily. 10 day course of therapy   Yes Historical Provider, MD  ferrous sulfate 325 (65 FE) MG tablet Take 325 mg by mouth daily with breakfast.   Yes Historical Provider, MD  fish oil-omega-3 fatty acids 1000 MG capsule Take 1 g by mouth daily.   Yes Historical Provider, MD  ibuprofen (ADVIL,MOTRIN) 200 MG tablet Take 400 mg by mouth every 8 (eight) hours as needed. For pain.   Yes Historical Provider, MD  insulin detemir (LEVEMIR FLEXPEN) 100 UNIT/ML injection Inject 50 Units into the skin 2 (two) times daily. 12/26/11  Yes Denita Lung, MD  insulin lispro (HUMALOG) 100 UNIT/ML injection Inject 25 Units into the skin 3 (three) times daily as needed. For low blood sugar as needed.   Yes Historical Provider, MD  Multiple Vitamin (MULTIVITAMIN WITH MINERALS) TABS Take 1 tablet by mouth daily.   Yes Historical Provider, MD  rosuvastatin (CRESTOR) 20 MG tablet Take 1 tablet (20 mg total) by mouth daily. 10/26/11  Yes Denita Lung, MD  triamterene-hydrochlorothiazide (MAXZIDE-25) 37.5-25 MG per tablet Take 1 tablet by  mouth daily.   Yes Historical Provider, MD   Physical Exam: Filed Vitals:   01/10/12 1239  BP: 176/94  Pulse: 104  Temp: 98.8 F (37.1 C)  TempSrc: Oral  Resp: 16  SpO2: 99%     General:  In no acute distress  Eyes: Anicteric pupils equally round and reactive to light  ENT: Dry mucous membrane  Neck: No JVD  Cardiovascular: Regular rate and rhythm with positive S1 and S2 no murmurs  rubs gallops  Respiratory: Good air movement clear to auscultation  Abdomen: Positive bowel sounds nontender nondistended and soft  Skin: Redness of the right foot to his ankle and significant the shoulder compared to the left foot. Exquisitely tender to palpation.  Musculoskeletal: Intact  Psychiatric: Appropriate  Neurologic: Nonfocal  Labs on Admission:  Basic Metabolic Panel:  Lab 0000000 1335  NA 135  K 4.1  CL 96  CO2 26  GLUCOSE 195*  BUN 19  CREATININE 1.35  CALCIUM 8.9  MG --  PHOS --   Liver Function Tests: No results found for this basename: AST:5,ALT:5,ALKPHOS:5,BILITOT:5,PROT:5,ALBUMIN:5 in the last 168 hours No results found for this basename: LIPASE:5,AMYLASE:5 in the last 168 hours No results found for this basename: AMMONIA:5 in the last 168 hours CBC:  Lab 01/10/12 1335  WBC 13.4*  NEUTROABS 11.0*  HGB 11.9*  HCT 35.6*  MCV 72.8*  PLT 327   Cardiac Enzymes: No results found for this basename: CKTOTAL:5,CKMB:5,CKMBINDEX:5,TROPONINI:5 in the last 168 hours  BNP (last 3 results) No results found for this basename: PROBNP:3 in the last 8760 hours CBG:  Lab 01/10/12 1258  GLUCAP 195*    Radiological Exams on Admission: No results found.  EKG: None  Assessment/Plan Cellulitis right foot: -Admit him to hospital for observation, start him on IV vancomycin, but get blood cultures x2. We'll get a foot x-ray to rule out osteomyelitis. We'll try to keep his leg elevated above his heart. And see how he does overnight. He is insisting that he wants to go home tomorrow.  -He will need to follow up with his provider as an outpatient to take care of his nails.  Diabetes mellitus -Continue Levemir home dose, start him on sliding scale insulin. Check a hemoglobin A1c.   Hypertension associated with diabetes - Continue home medications no changes made, blood pressure currently high most likely secondary to pain. He is hydralazine when necessary. -  Continue ibuprofen for pain.  Leukocytosis -Most likely secondary to cellulitis. - BC x2  Code Status: Full Family Communication: Wife Disposition Plan: Obs 1-2 days  Time spent: 5 minutes  Charlynne Cousins [facility] Pager [phone]  If 7PM-7AM, please contact night-coverage [email or web address] Password [email or web address] 01/10/2012, 4:03 PM

## 2012-01-10 NOTE — ED Notes (Signed)
Pt stubbed 2nd and 3rd toes on R foot x 2 weeks.  Pt sts pain and swelling has increased.  Pt sts his diabetic MD, Janie Morning, gave him an antibiotic yesterday.  Followed up with Podiatrist, Jah, today and was sent here.  Pt sts MD had some concerns with x-ray also.

## 2012-01-10 NOTE — Progress Notes (Signed)
  CARE MANAGEMENT ED NOTE 01/10/2012  Patient:  Austin Kramer, Austin Kramer   Account Number:  [id number]  Date Initiated:  01/10/2012  Documentation initiated by:  Jackelyn Poling  Subjective/Objective Assessment:   51 yr old male 26 phcs Has recently started working after 2 years with new insurance coverage that he has not checked medication coverage for He has previously received medications at [facility] for $4 Admitted for observation     Subjective/Objective Assessment Detail:   dx hypergycemia cbg 34 CM consult for trouble affording medications  pt is not eligible for [facility] medication assistance program     Action/Plan:   Cm assessed concerns with trouble affording medications Cm discussed and provided written information for [email or web address], outpatient pharmacies, discounted pharmacies and financial resources in [employer]   Action/Plan Detail:   Cm encouraged to pt to check with is insurance coverage medication plan and resources Pt voiced appreciation and understanding of resources offered to him   Anticipated DC Date:  01/11/2012     Status Recommendation to Physician:   Result of Recommendation:    Other ED Services  Consult Working [place]  Other  Medication Assistance  Outpatient Services - Pt will follow up    Choice offered to / List presented to:            Status of service:  Completed, signed off  ED Comments:   ED Comments Detail:

## 2012-01-10 NOTE — Progress Notes (Signed)
ANTIBIOTIC CONSULT NOTE - INITIAL  Pharmacy Consult for Vancomycin Indication: cellulitis, r/o osteo  No Known Allergies  Patient Measurements:   Adjusted Body Weight:   Vital Signs: Temp: 98.3 F (36.8 C) (11/27 1802) Temp src: Oral (11/27 1802) BP: 176/113 mmHg (11/27 1802) Pulse Rate: 97  (11/27 1802) Intake/Output from previous day:   Intake/Output from this shift:    Labs:  Basename 01/10/12 1335  WBC 13.4*  HGB 11.9*  PLT 327  LABCREA --  CREATININE 1.35   The CrCl is unknown because both a height and weight (above a minimum accepted value) are required for this calculation. No results found for this basename: VANCOTROUGH:2,VANCOPEAK:2,VANCORANDOM:2,GENTTROUGH:2,GENTPEAK:2,GENTRANDOM:2,TOBRATROUGH:2,TOBRAPEAK:2,TOBRARND:2,AMIKACINPEAK:2,AMIKACINTROU:2,AMIKACIN:2, in the last 72 hours   Microbiology: No results found for this or any previous visit (from the past 720 hour(s)).  Medical History: Past Medical History  Diagnosis Date  . Diabetes mellitus   . Hypertension   . ED (erectile dysfunction)   . Obesity   . Cancer     PROSTATE  . PPD positive, treated    Assessment:  51 yo diabetic male presented 11/27 with lower extremity redness and pain.  Pt was seen at PCP 4 days prior and was prescribed Keflex for cellulitis. X-ray done at podiatrist without osteo but referred to ER since pt without any improvement.  MD ordered vancomycin per pharmacy, pending X-ray to r/o osteo.   Afebrile, WBC 13.4.  Pt reported weighing 122.5 kg.  Scr 1.35, CG CrCl 87.5 ml/min, normalized 66 ml/min  Pending blood cultures and wound culture  Goal of Therapy:  Vancomycin trough level 15-20 mcg/ml (until osteo is ruled out)  Plan:   Vancomycin 2500 mg IV x 1 as loading dose then Vancomycin 1250 mg IV q12h  Pharmacy will f/u result of X-ray and adjust dose as needed.  Given history of diabetes, please consider adding gram negative coverage if no improvement.  Thanks  Vanessa [place], PharmD, BCPS Pager: [phone] 6:23 PM Pharmacy #: [phone]

## 2012-01-10 NOTE — ED Provider Notes (Signed)
History     CSN: [id number]  Arrival date & time 01/10/12  1231   First MD Initiated Contact with Patient 01/10/12 1252      Chief Complaint  Patient presents with  . Cellulitis  . Toe Injury    (Consider location/radiation/quality/duration/timing/severity/associated sxs/prior treatment) HPI Comments: 51 year old male with a history of diabetes, diabetic nephropathy, obesity, and prostate cancer presents emergency department with a chief complaint of cellulitis.  Patient states that he was evaluated by his endocrinologist and started on Keflex for outpatient treatment of the cellulitis, but reports that he has had worsened pain and swelling since the start of antibiotic treatment.  Patient was sent to the emergency department after evaluation by his podiatrist, Dr Melony Overly, who did imaging of the extremity with no signs of osteomyelitis, but recommends IV antibiotic treatment for worsening cellulitis.  Patient denies any abdominal pain, emesis, night sweats or chills.  The history is provided by the patient.    Past Medical History  Diagnosis Date  . Diabetes mellitus   . Hypertension   . ED (erectile dysfunction)   . Obesity   . Cancer     PROSTATE  . PPD positive, treated     Past Surgical History  Procedure Date  . Vasectomy   . Prostatectomy 2011  . Knee cartilage surgery     History reviewed. No pertinent family history.  History  Substance Use Topics  . Smoking status: Never Smoker   . Smokeless tobacco: Not on file  . Alcohol Use: No      Review of Systems  Constitutional: Negative for fever, diaphoresis and activity change.  HENT: Negative for congestion and neck pain.   Respiratory: Negative for cough.   Genitourinary: Negative for dysuria.  Musculoskeletal: Negative for myalgias.  Skin: Positive for color change and wound.  Neurological: Negative for headaches.  All other systems reviewed and are negative.    Allergies  Review of patient's  allergies indicates no known allergies.  Home Medications   Current Outpatient Rx  Name  Route  Sig  Dispense  Refill  . AMITRIPTYLINE HCL 10 MG PO TABS   Oral   Take 1 tablet (10 mg total) by mouth at bedtime.   30 tablet   11   . AMLODIPINE BESYLATE-VALSARTAN 5-320 MG PO TABS   Oral   Take 1 tablet by mouth daily.           . CEPHALEXIN 500 MG PO CAPS   Oral   Take 500 mg by mouth 2 (two) times daily. 10 day course of therapy         . FERROUS SULFATE 325 (65 FE) MG PO TABS   Oral   Take 325 mg by mouth daily with breakfast.         . OMEGA-3 FATTY ACIDS 1000 MG PO CAPS   Oral   Take 1 g by mouth daily.         . IBUPROFEN 200 MG PO TABS   Oral   Take 400 mg by mouth every 8 (eight) hours as needed. For pain.         . INSULIN DETEMIR 100 UNIT/ML [place] SOLN   Subcutaneous   Inject 50 Units into the skin 2 (two) times daily.   6 mL   0   . INSULIN LISPRO (HUMAN) 100 UNIT/ML [place] SOLN   Subcutaneous   Inject 25 Units into the skin 3 (three) times daily as needed. For low blood sugar as  needed.         . ADULT MULTIVITAMIN W/MINERALS CH   Oral   Take 1 tablet by mouth daily.         Marland Kitchen ROSUVASTATIN CALCIUM 20 MG PO TABS   Oral   Take 1 tablet (20 mg total) by mouth daily.   42 tablet   0   . TRIAMTERENE-HCTZ 37.5-25 MG PO TABS   Oral   Take 1 tablet by mouth daily.           BP 176/94  Pulse 104  Temp 98.8 F (37.1 C) (Oral)  Resp 16  SpO2 99%  Physical Exam  Constitutional: He appears well-developed and well-nourished. No distress.  HENT:  Head: Normocephalic.  Eyes: Conjunctivae normal and EOM are normal. Pupils are equal, round, and reactive to light.  Neck: Normal range of motion. Neck supple.  Cardiovascular: Normal rate.        Intact distal pulses  Pulmonary/Chest: Effort normal.  Musculoskeletal:       ttp across right forefoot. Normal ROM.   Skin: Skin is dry. He is not diaphoretic.       Right foot warm with swelling &  erythema. Purulent drainage from 2nd toe.     ED Course  Procedures (including critical care time)  Labs Reviewed  GLUCOSE, CAPILLARY - Abnormal; Notable for the following:    Glucose-Capillary 195 (*)     All other components within normal limits  CBC WITH DIFFERENTIAL - Abnormal; Notable for the following:    WBC 13.4 (*)     Hemoglobin 11.9 (*)     HCT 35.6 (*)     MCV 72.8 (*)     MCH 24.3 (*)     Neutrophils Relative 82 (*)     Lymphocytes Relative 11 (*)     Neutro Abs 11.0 (*)     All other components within normal limits  BASIC METABOLIC PANEL - Abnormal; Notable for the following:    Glucose, Bld 195 (*)     GFR calc non Af Amer 59 (*)     GFR calc Af Amer 69 (*)     All other components within normal limits  WOUND CULTURE  CULTURE, BLOOD (ROUTINE X 2)  CULTURE, BLOOD (ROUTINE X 2)   No results found.   No diagnosis found.    MDM  51 yo DM male to ER for right foot cellulitis. Per pt he has been on Keflex written by his endocrinologist, but the pain and swelling has worsened. Pt was referred to Podiatrist, Dr. Melony Overly who has sent pt to ER for admission and IV abx. Pt tachycardic with leukocytosis. Clindamycin started. Pt to be admitted for observation and more IV abx. The patient appears reasonably stabilized for admission considering the current resources, flow, and capabilities available in the ED at this time, and I doubt any other [facility] requiring further screening and/or treatment in the ED prior to admission.         Verl Dicker, [place] 01/10/12 1556

## 2012-01-10 NOTE — ED Notes (Signed)
Pt's doctor (Dr. Melony Overly) called from [facility] to have pt seen.  Pt has cellulitis of second toe on right foot.  Pt has diabetic neuropathy.

## 2012-01-11 ENCOUNTER — Observation Stay (HOSPITAL_COMMUNITY): Payer: No Typology Code available for payment source

## 2012-01-11 DIAGNOSIS — E871 Hypo-osmolality and hyponatremia: Secondary | ICD-10-CM

## 2012-01-11 DIAGNOSIS — N289 Disorder of kidney and ureter, unspecified: Secondary | ICD-10-CM

## 2012-01-11 DIAGNOSIS — E876 Hypokalemia: Secondary | ICD-10-CM

## 2012-01-11 HISTORY — DX: Disorder of kidney and ureter, unspecified: N28.9

## 2012-01-11 LAB — CBC
Hemoglobin: 10.9 g/dL — ABNORMAL LOW (ref 13.0–17.0)
MCH: 24.3 pg — ABNORMAL LOW (ref 26.0–34.0)
MCHC: 33.1 g/dL (ref 30.0–36.0)

## 2012-01-11 LAB — COMPREHENSIVE METABOLIC PANEL
ALT: 17 U/L (ref 0–53)
AST: 16 U/L (ref 0–37)
CO2: 25 mEq/L (ref 19–32)
Chloride: 97 mEq/L (ref 96–112)
GFR calc non Af Amer: 50 mL/min — ABNORMAL LOW (ref >90)
Potassium: 3.2 mEq/L — ABNORMAL LOW (ref 3.5–5.1)
Sodium: 132 mEq/L — ABNORMAL LOW (ref 135–145)
Total Bilirubin: 0.3 mg/dL (ref 0.3–1.2)

## 2012-01-11 LAB — GLUCOSE, CAPILLARY
Glucose-Capillary: 124 mg/dL — ABNORMAL HIGH (ref 70–99)
Glucose-Capillary: 112 mg/dL — ABNORMAL HIGH (ref 70–99)
Glucose-Capillary: 165 mg/dL — ABNORMAL HIGH (ref 70–99)

## 2012-01-11 LAB — HEMOGLOBIN A1C: Mean Plasma Glucose: 235 mg/dL — ABNORMAL HIGH (ref <117)

## 2012-01-11 MED ORDER — POTASSIUM CHLORIDE CRYS ER 20 MEQ PO TBCR
40.0000 meq | EXTENDED_RELEASE_TABLET | Freq: Once | ORAL | Status: AC
  Administered 2012-01-11: 40 meq via ORAL
  Filled 2012-01-11: qty 2

## 2012-01-11 MED ORDER — GADOBENATE DIMEGLUMINE 529 MG/ML IV SOLN
20.0000 mL | Freq: Once | INTRAVENOUS | Status: AC | PRN
  Administered 2012-01-11: 20 mL via INTRAVENOUS

## 2012-01-11 MED ORDER — SODIUM CHLORIDE 0.9 % IV SOLN
INTRAVENOUS | Status: DC
  Administered 2012-01-12: 03:00:00 via INTRAVENOUS

## 2012-01-11 NOTE — Progress Notes (Signed)
[facility] PROGRESS NOTE  Austin Kramer [id number] DOB: Dec 03, 1960 DOA: 01/10/2012 PCP: Wyatt Haste, MD  Assessment/Plan: 1. Right Foot Cellulitis:  I will continue with vancomycin day 2. Although swelling and pain has improve I will order MRI , his second toe has open wound and chronic changes of infection, concern for osteomyelitis in a Diabetic patient. Blood culture no growth to date.  2. Acute renal insufficiency: I will discontinue ibuprofen, hold ARB. I will continue with IV fluids.  3. Diabetes/ uncontrolled. Continue with levemir, SSI. I explain to patient importance of good diabetes control to help healing right foot infection.  4. Hypokalemia: Replace with 40 meq.  5. Hyponatremia: IV fluids.   Code Status: Full Code.  Family Communication: Discussed care with patient.  Disposition Plan: Continue with IV antibiotics, MRI right foot.    Consultants:  None.    Antibiotics: Vancomycin 11-27  HPI/Subjective: Feeling better, relates pain and swelling has improved.    Objective: Filed Vitals:   01/10/12 1239 01/10/12 1705 01/10/12 1802 01/10/12 2218  BP: 176/94 178/98 176/113 157/90  Pulse: 104 95 97 109  Temp: 98.8 F (37.1 C) 98.6 F (37 C) 98.3 F (36.8 C) 98.9 F (37.2 C)  TempSrc: Oral  Oral Oral  Resp: 16 20 20 21   Height:   6' (1.829 m)   Weight:   122.471 kg (270 lb)   SpO2: 99% 98% 100% 100%    Intake/Output Summary (Last 24 hours) at 01/11/12 0929 Last data filed at 01/11/12 0830  Gross per 24 hour  Intake 716.25 ml  Output    925 ml  Net -208.75 ml   Filed Weights   01/10/12 1802  Weight: 122.471 kg (270 lb)    Exam:   General:  Patient is in no acute distress.   Cardiovascular: S 1, S 2, RRR  Respiratory: CTA  Abdomen: BS, soft, NT, ND.   Extremities; Right ankle with swelling, 2 toe with swelling, discoloration.   Data Reviewed: Basic Metabolic Panel:  Lab [id number] 0425 01/10/12 1335  NA 132* 135  K  3.2* 4.1  CL 97 96  CO2 25 26  GLUCOSE 155* 195*  BUN 20 19  CREATININE 1.55* 1.35  CALCIUM 8.5 8.9  MG -- --  PHOS -- --   Liver Function Tests:  Lab 01/11/12 0425  AST 16  ALT 17  ALKPHOS 90  BILITOT 0.3  PROT 6.5  ALBUMIN 1.9*  CBC:  Lab 01/11/12 0425 01/10/12 1335  WBC 10.0 13.4*  NEUTROABS -- 11.0*  HGB 10.9* 11.9*  HCT 32.9* 35.6*  MCV 73.4* 72.8*  PLT 315 327   Cardiac Enzymes: No results found for this basename: CKTOTAL:5,CKMB:5,CKMBINDEX:5,TROPONINI:5 in the last 168 hours BNP (last 3 results) No results found for this basename: PROBNP:3 in the last 8760 hours CBG:  Lab 01/11/12 0756 01/10/12 2216 01/10/12 1834 01/10/12 1258  GLUCAP 124* 236* 210* 195*    Recent Results (from the past 240 hour(s))  CULTURE, BLOOD (ROUTINE X 2)     Status: Normal (Preliminary result)   Collection Time   01/10/12  1:35 PM      Component Value Range Status Comment   Specimen Description BLOOD RIGHT ARM   Final    Special Requests BOTTLES DRAWN AEROBIC AND ANAEROBIC 3CC   Final    Culture  Setup Time 01/10/2012 22:46   Final    Culture     Final    Value:  BLOOD CULTURE RECEIVED NO GROWTH TO DATE CULTURE WILL BE HELD FOR 5 DAYS BEFORE ISSUING A FINAL NEGATIVE REPORT   Report Status PENDING   Incomplete   CULTURE, BLOOD (ROUTINE X 2)     Status: Normal (Preliminary result)   Collection Time   01/10/12  1:50 PM      Component Value Range Status Comment   Specimen Description BLOOD RIGHT ARM   Final    Special Requests BOTTLES DRAWN AEROBIC AND ANAEROBIC 3CC   Final    Culture  Setup Time 01/10/2012 22:47   Final    Culture     Final    Value:        BLOOD CULTURE RECEIVED NO GROWTH TO DATE CULTURE WILL BE HELD FOR 5 DAYS BEFORE ISSUING A FINAL NEGATIVE REPORT   Report Status PENDING   Incomplete   WOUND CULTURE     Status: Normal (Preliminary result)   Collection Time   01/10/12  3:31 PM      Component Value Range Status Comment   Specimen Description TOE    Final    Special Requests Normal   Final    Gram Stain PENDING   Incomplete    Culture NO GROWTH   Final    Report Status PENDING   Incomplete      Studies: Dg Ankle Complete Right  01/10/2012  *RADIOLOGY REPORT*  Clinical Data: Medial ankle pain.  Swelling around the entire ankle.  Evaluate for osteomyelitis.  RIGHT ANKLE - COMPLETE 3+ VIEW  Comparison: No priors.  Findings: Extensive soft tissue swelling around the entire ankle is noted, slightly asymmetric (medial greater than lateral). 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  No acute displaced fracture, subluxation or dislocation. No definite lytic bony lesions to strongly suggest presence of osteomyelitis.  Scattered areas of heterotopic bone are noted, most pronounced in the dorsal aspect of the foot adjacent to the midfoot articulation with the metatarsals.  Pes planus.  IMPRESSION: 1.  No definite signs to suggest osteomyelitis.  There is extensive soft tissue swelling around the ankle joint, as above.   Original Report Authenticated By: Vinnie Langton, M.D.     Scheduled Meds:   . [COMPLETED] sodium chloride   Intravenous STAT  . amitriptyline  10 mg Oral QHS  . amLODipine  5 mg Oral Daily  . atorvastatin  10 mg Oral q1800  . [COMPLETED] clindamycin (CLEOCIN) IV  600 mg Intravenous Once  . ferrous sulfate  325 mg Oral Q breakfast  . heparin  5,000 Units Subcutaneous Q8H  . influenza  inactive virus vaccine  0.5 mL Intramuscular Tomorrow-1000  . insulin aspart  0-15 Units Subcutaneous TID WC  . insulin aspart  0-5 Units Subcutaneous QHS  . insulin aspart  4 Units Subcutaneous TID WC  . insulin detemir  50 Units Subcutaneous BID  . pneumococcal 23 valent vaccine  0.5 mL Intramuscular Tomorrow-1000  . potassium chloride  40 mEq Oral Once  . sodium chloride  3 mL Intravenous Q12H  . vancomycin  1,250 mg Intravenous Q12H  . [COMPLETED] vancomycin  2,500 mg Intravenous NOW  . [DISCONTINUED]  amLODipine-valsartan  1 tablet Oral Daily  . [DISCONTINUED] irbesartan  300 mg Oral Daily  . [DISCONTINUED] triamterene-hydrochlorothiazide  1 each Oral Daily   Continuous Infusions:   . sodium chloride      Principal Problem:  *Cellulitis Active Problems:  Diabetes mellitus  Hypertension associated with diabetes  Leukocytosis    Time  spent: 30 minute    [facility] Pager [phone]. If 8PM-8AM, please contact night-coverage at [email or web address], password [email or web address] 01/11/2012, 9:29 AM  LOS: 1 day

## 2012-01-12 DIAGNOSIS — E785 Hyperlipidemia, unspecified: Secondary | ICD-10-CM

## 2012-01-12 LAB — BASIC METABOLIC PANEL
BUN: 18 mg/dL (ref 6–23)
Calcium: 8.5 mg/dL (ref 8.4–10.5)
Creatinine, Ser: 1.47 mg/dL — ABNORMAL HIGH (ref 0.50–1.35)
GFR calc Af Amer: 62 mL/min — ABNORMAL LOW (ref >90)
GFR calc non Af Amer: 54 mL/min — ABNORMAL LOW (ref >90)

## 2012-01-12 LAB — GLUCOSE, CAPILLARY: Glucose-Capillary: 52 mg/dL — ABNORMAL LOW (ref 70–99)

## 2012-01-12 MED ORDER — DEXTROSE 50 % IV SOLN
25.0000 mL | Freq: Once | INTRAVENOUS | Status: AC | PRN
  Administered 2012-01-12: 08:00:00 via INTRAVENOUS

## 2012-01-12 MED ORDER — HYDROCODONE-ACETAMINOPHEN 5-325 MG PO TABS: 1.0000 | ORAL_TABLET | ORAL | Status: DC | PRN

## 2012-01-12 MED ORDER — DEXTROSE 50 % IV SOLN
INTRAVENOUS | Status: AC
  Filled 2012-01-12: qty 50

## 2012-01-12 MED ORDER — AMLODIPINE BESYLATE 5 MG PO TABS: 10.0000 mg | ORAL_TABLET | Freq: Every day | ORAL | Status: DC

## 2012-01-12 MED ORDER — DOXYCYCLINE HYCLATE 100 MG PO TABS: 100.0000 mg | ORAL_TABLET | Freq: Two times a day (BID) | ORAL | Status: DC

## 2012-01-12 NOTE — Discharge Summary (Signed)
Physician Discharge Summary  Austin Kramer [id number] DOB: 04/19/1960 DOA: 01/10/2012  PCP: Wyatt Haste, MD  Admit date: 01/10/2012 Discharge date: 01/12/2012  Time spent: 35 minutes  Recommendations for Outpatient Follow-up:  1. Follow up with Dr Sharol Given within 1 week. Follow up with PCP within 1 week to check renal function and adjust medications for Blood pressure.   Discharge Diagnoses:   Right Foot Cellulitis and second toe osteomyelitis  Diabetes mellitus  Hypertension associated with diabetes  Leukocytosis  Hyponatremia  Hypokalemia  Renal insufficiency   Discharge Condition: Stable.  Diet recommendation: Diabetic diet.   Filed Weights   01/10/12 1802  Weight: 122.471 kg (270 lb)    History of present illness:  Austin Kramer is a 51 y.o. male  Past medical history of diabetes mellitus, unknown hemoglobin A1c, peripheral neuropathy. That comes in for lower extremity redness and pain. He relates this started a week prior to admission he hit his foot against a machine. Did not pay much attention. 4 days prior to admission he went to his primary care doctor who started him on empiric antibiotic. Referred him to a podiatrist. He went to his podiatrist today. An x-ray was done that did not show any osteomyelitis. He was sent over here as seemed it was not improving. The patient is total able to tolerate orals, relates some minor fevers, no chills nausea or vomiting.   Hospital Course:  Right Foot Cellulitis and second toe osteomyelitis: Patient was treated  with vancomycin for 3 days.  Blood culture no growth to date. MRI showed 2 toe osteomyelitis, 1. Diffuse low-level edema and enhancement in the midfoot, with subcortical lesions of multiple locations in the midfoot and along the Lisfranc joint, and potentially some early fragmentation along these more degenerated articulations. 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  Mild dorsal heterotopic ossification along the Lisfranc joint. Tibialis posterior distal tendinopathy and tenosynovitis, correlate clinically in assessing for tibialis posterior dysfunction. Plantar fasciitis. Dr Sharol Given was consulted and recommend to continue with doxycycline and patient will follow with him in 1 week to arrange for amputation of second toe. Will give total of 14 days treatment.   Acute renal insufficiency: Ibuprofen was discontinue, hold ARB on discharge. B-met pending if stable patient will be discharge today with follow up with PCP for repeat kidney function.  Diabetes/ uncontrolled. Continue with levemir . I explain to patient importance of good diabetes control to help healing right foot infection.  Hypokalemia: Was Replace with 40 meq. Hypertension: I will hold ARB and diuretic due to mild renal insufficiency. He will be discharge on higher dose Norvasc.    Procedures:  None  Consultations:  Dr. Sharol Given.   Discharge Exam: Filed Vitals:   01/11/12 1446 01/11/12 2140 01/11/12 2215 01/12/12 0525  BP: 151/86 165/94 164/92 159/95  Pulse: 100 88  88  Temp: 97.7 F (36.5 C) 97.5 F (36.4 C)  97.8 F (36.6 C)  TempSrc: Oral Oral  Oral  Resp: 20 16  16   Height:      Weight:      SpO2: 100% 100%  100%    General: No Distress. Cardiovascular: S1, S2 RRR Respiratory: CTA Extremities; Right foot with decrease swelling and redness.   Discharge Instructions     Medication List     As of 01/12/2012 12:01 PM    ASK your doctor about these medications  amitriptyline 10 MG tablet   Commonly known as: ELAVIL   Take 1 tablet (10 mg total) by mouth at bedtime.      amLODipine-valsartan 5-320 MG per tablet   Commonly known as: EXFORGE   Take 1 tablet by mouth daily.      cephALEXin 500 MG capsule   Commonly known as: KEFLEX   Take 500 mg by mouth 2 (two) times daily. 10 day  course of therapy      ferrous sulfate 325 (65 FE) MG tablet   Take 325 mg by mouth daily with breakfast.      fish oil-omega-3 fatty acids 1000 MG capsule   Take 1 g by mouth daily.      ibuprofen 200 MG tablet   Commonly known as: ADVIL,MOTRIN   Take 400 mg by mouth every 8 (eight) hours as needed. For pain.      insulin detemir 100 UNIT/ML injection   Commonly known as: LEVEMIR   Inject 50 Units into the skin 2 (two) times daily.      insulin lispro 100 UNIT/ML injection   Commonly known as: HUMALOG   Inject 25 Units into the skin 3 (three) times daily as needed. For low blood sugar as needed.      multivitamin with minerals Tabs   Take 1 tablet by mouth daily.      rosuvastatin 20 MG tablet   Commonly known as: CRESTOR   Take 1 tablet (20 mg total) by mouth daily.      triamterene-hydrochlorothiazide 37.5-25 MG per tablet   Commonly known as: MAXZIDE-25   Take 1 tablet by mouth daily.           Follow-up Information    Follow up with DUDA,Reakwon V, MD. In 1 week.   Contact information:   [place] [address] [phone]           The results of significant diagnostics from this hospitalization (including imaging, microbiology, ancillary and laboratory) are listed below for reference.    Significant Diagnostic Studies: Dg Ankle Complete Right  01/10/2012  *RADIOLOGY REPORT*  Clinical Data: Medial ankle pain.  Swelling around the entire ankle.  Evaluate for osteomyelitis.  RIGHT ANKLE - COMPLETE 3+ VIEW  Comparison: No priors.  Findings: Extensive soft tissue swelling around the entire ankle is noted, slightly asymmetric (medial greater than lateral). 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  No acute displaced fracture, subluxation or dislocation. No definite lytic bony lesions to strongly suggest presence of osteomyelitis.  Scattered areas of heterotopic bone are noted, most pronounced in the dorsal aspect  of the foot adjacent to the midfoot articulation with the metatarsals.  Pes planus.  IMPRESSION: 1.  No definite signs to suggest osteomyelitis.  There is extensive soft tissue swelling around the ankle joint, as above.   Original Report Authenticated By: Vinnie Langton, M.D.    Mr Foot Right W Wo Contrast  01/11/2012  *RADIOLOGY REPORT*  Clinical Data: Foot pain and swelling mainly involving the second toe.  MRI OF THE RIGHT FOREFOOT WITHOUT AND WITH CONTRAST  Technique:  Multiplanar, multisequence MR imaging was performed both before and after administration of intravenous contrast.  Contrast: 45mL MULTIHANCE GADOBENATE DIMEGLUMINE 529 MG/ML IV SOLN  Comparison: None.  Findings: There is diffuse soft tissue swelling surrounding the second digit consistent with cellulitis.  No discrete soft tissue abscess.  There is a signal abnormality and abnormal enhancement in the middle and distal phalanges  consistent with osteomyelitis.  The proximal phalanx is normal.  In the mid foot region there is signal abnormality in the second and fourth metatarsals.  Mild enhancement is also suggested.  There is diffuse midfoot myositis but no focal soft tissue abscess/pyomyositis.  The metatarsal phalangeal joints appear intact.  IMPRESSION:  1.  Diffuse cellulitis involving the second toe without discrete soft tissue abscess. 2.  Osteomyelitis involving the middle and distal phalanges of the second toe.  Septic arthritis involving the distal interphalangeal joint is possible. 3.  Signal abnormality and enhancement in the second and fourth metatarsals is suspicious for osteomyelitis.  There is surrounding midfoot myositis.   Original Report Authenticated By: Marijo Sanes, M.D.    Mr Ankle Right W Wo Contrast  01/12/2012  *RADIOLOGY REPORT*  Clinical Data:  Erythema, pain, and swelling in the ankle.  MRI OF THE RIGHT ANKLE WITH AND WITHOUT CONTRAST  Technique:  Multiplanar, multisequence MR imaging of the right ankle was  performed before and after the administration of intravenous contrast.  Contrast: 23mL MULTIHANCE GADOBENATE DIMEGLUMINE 529 MG/ML IV SOLN  Comparison:  MRI of the right forefoot from 01/11/2012; radiographs dated 01/10/2012  Findings: Diffuse subcutaneous edema is observed in the ankle with mild associated enhancement.  Achilles tendon unremarkable.  Medial and lateral band of the plantar fascia appear mildly thickened and with adjacent edema proximally favoring plantar fasciitis.  There is abnormal edema signal throughout the navicular, cuboid, cuneiforms, and proximal portions of the second and fourth metatarsals.  Subcortical cystic lesions with mild articular surface irregularities are observed along several mid foot articulations including the articulation between the navicular and medial cuneiform, the articulation between the middle cuneiform and base of the second metatarsal, and at the articulation between the cuboid and the base of the fourth metatarsal.  Low-level edema and enhancement is present anteriorly in the lateral portion of the calcaneus.  Date degenerative subcortical lesion is present posteriorly in the tibial plafond  The inferior tibiofibular ligaments appear intact.  The anterior talofibular ligament is indistinct.  The remainder of the lateral ligamentous complex appears intact.  The deltoid ligament appears grossly intact.  Mildly thickened superomedial component of the spring ligament noted, with adjacent distal tibialis posterior tenosynovitis and tendinopathy. Indistinct inferoplantar longitudinal component of the spring ligament could be torn.  Recent radiography revealed heterotopic ossification dorsal to the Lisfranc joint.  IMPRESSION:  1.  Diffuse low-level edema and enhancement in the midfoot, with subcortical lesions of multiple locations in the midfoot and along the Lisfranc joint, and potentially some early fragmentation along these more degenerated articulations. 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 2.  Mild dorsal heterotopic ossification along the Lisfranc joint. 3.  Tibialis posterior distal tendinopathy and tenosynovitis, correlate clinically in assessing for tibialis posterior dysfunction. 4.  Plantar fasciitis. 5.  The anterior talofibular ligament is somewhat indistinct and could be sprained. 6.  Inferior components of the spring ligament appear indistinct and potentially discontinuous.   Original Report Authenticated By: Van Clines, M.D.     Microbiology: Recent Results (from the past 240 hour(s))  CULTURE, BLOOD (ROUTINE X 2)     Status: Normal (Preliminary result)   Collection Time   01/10/12  1:35 PM      Component Value Range Status Comment   Specimen Description BLOOD RIGHT ARM   Final    Special Requests BOTTLES DRAWN AEROBIC AND ANAEROBIC 3CC   Final  Culture  Setup Time 01/10/2012 22:46   Final    Culture     Final    Value:        BLOOD CULTURE RECEIVED NO GROWTH TO DATE CULTURE WILL BE HELD FOR 5 DAYS BEFORE ISSUING A FINAL NEGATIVE REPORT   Report Status PENDING   Incomplete   CULTURE, BLOOD (ROUTINE X 2)     Status: Normal (Preliminary result)   Collection Time   01/10/12  1:50 PM      Component Value Range Status Comment   Specimen Description BLOOD RIGHT ARM   Final    Special Requests BOTTLES DRAWN AEROBIC AND ANAEROBIC 3CC   Final    Culture  Setup Time 01/10/2012 22:47   Final    Culture     Final    Value:        BLOOD CULTURE RECEIVED NO GROWTH TO DATE CULTURE WILL BE HELD FOR 5 DAYS BEFORE ISSUING A FINAL NEGATIVE REPORT   Report Status PENDING   Incomplete   WOUND CULTURE     Status: Normal (Preliminary result)   Collection Time   01/10/12  3:31 PM      Component Value Range Status Comment   Specimen Description TOE   Final    Special Requests Normal   Final    Gram Stain     Final    Value: NO  WBC SEEN     NO SQUAMOUS EPITHELIAL CELLS SEEN     NO ORGANISMS SEEN   Culture Culture reincubated for better growth   Final    Report Status PENDING   Incomplete      Labs: Basic Metabolic Panel:  Lab [id number] 0425 01/10/12 1335  NA 132* 135  K 3.2* 4.1  CL 97 96  CO2 25 26  GLUCOSE 155* 195*  BUN 20 19  CREATININE 1.55* 1.35  CALCIUM 8.5 8.9  MG -- --  PHOS -- --   Liver Function Tests:  Lab 01/11/12 0425  AST 16  ALT 17  ALKPHOS 90  BILITOT 0.3  PROT 6.5  ALBUMIN 1.9*   CBC:  Lab 01/11/12 0425 01/10/12 1335  WBC 10.0 13.4*  NEUTROABS -- 11.0*  HGB 10.9* 11.9*  HCT 32.9* 35.6*  MCV 73.4* 72.8*  PLT 315 327   Cardiac Enzymes: No results found for this basename: CKTOTAL:5,CKMB:5,CKMBINDEX:5,TROPONINI:5 in the last 168 hours BNP: BNP (last 3 results) No results found for this basename: PROBNP:3 in the last 8760 hours CBG:  Lab 01/12/12 0837 01/12/12 0742 01/11/12 2143 01/11/12 1638 01/11/12 1321  GLUCAP 93 52* 165* 112* 117*       Signed:  Herman Kramer  [facility] 01/12/2012, 12:01 PM

## 2012-01-12 NOTE — Progress Notes (Signed)
Patient received discharge instructions and verbalized understanding without further questions. Patient follow up appoints and prescriptions discussed and given to patient. Patient belongings packed. Patient to be taken downstairs via wheelchair to be discharged home when ride arrives.

## 2012-01-12 NOTE — Progress Notes (Signed)
Hypoglycemic Event  CBG: 52 0745  Treatment: D50 IV 25 mL  Symptoms: None  Follow-up CBG: Time: [id number] CBG Result: 93  Possible Reasons for Event: Inadequate meal intake  Comments/MD notified: Patient was in bathroom so follow up CBG was taken later than 77mins    Buel Ream  Remember to initiate Hypoglycemia Order Set & complete

## 2012-01-12 NOTE — ED Provider Notes (Signed)
Medical screening examination/treatment/procedure(s) were performed by non-physician practitioner and as supervising physician I was immediately available for consultation/collaboration.  Threasa Beards, MD 01/12/12 [phone]

## 2012-01-12 NOTE — Consult Note (Signed)
Reason for Consult: Osteomyelitis right foot second toe Referring Physician: Stevenmichael Dewoody is an 51 y.o. male.  HPI: Patient is a 51 year old gentleman with diabetes hypertension with chronic osteomyelitis of the right foot second toe.  Past Medical History  Diagnosis Date  . Diabetes mellitus   . Hypertension   . ED (erectile dysfunction)   . Obesity   . Cancer     PROSTATE  . PPD positive, treated     Past Surgical History  Procedure Date  . Vasectomy   . Prostatectomy 2011  . Knee cartilage surgery     Family History  Problem Relation Age of Onset  . Diabetes Mother   . Diabetes Father     Social History:  reports that he has never smoked. He has never used smokeless tobacco. He reports that he does not drink alcohol or use illicit drugs.  Allergies: No Known Allergies  Medications: I have reviewed the patient's current medications.  Results for orders placed during the hospital encounter of 01/10/12 (from the past 48 hour(s))  GLUCOSE, CAPILLARY     Status: Abnormal   Collection Time   01/10/12 12:58 PM      Component Value Range Comment   Glucose-Capillary 195 (*) 70 - 99 mg/dL    Comment 1 Notify RN     CBC WITH DIFFERENTIAL     Status: Abnormal   Collection Time   01/10/12  1:35 PM      Component Value Range Comment   WBC 13.4 (*) 4.0 - 10.5 K/uL    RBC 4.89  4.22 - 5.81 MIL/uL    Hemoglobin 11.9 (*) 13.0 - 17.0 g/dL    HCT 35.6 (*) 39.0 - 52.0 %    MCV 72.8 (*) 78.0 - 100.0 fL    MCH 24.3 (*) 26.0 - 34.0 pg    MCHC 33.4  30.0 - 36.0 g/dL    RDW 14.8  11.5 - 15.5 %    Platelets 327  150 - 400 K/uL    Neutrophils Relative 82 (*) 43 - 77 %    Lymphocytes Relative 11 (*) 12 - 46 %    Monocytes Relative 7  3 - 12 %    Eosinophils Relative 0  0 - 5 %    Basophils Relative 0  0 - 1 %    Neutro Abs 11.0 (*) 1.7 - 7.7 K/uL    Lymphs Abs 1.5  0.7 - 4.0 K/uL    Monocytes Absolute 0.9  0.1 - 1.0 K/uL    Eosinophils Absolute 0.0  0.0 - 0.7 K/uL     Basophils Absolute 0.0  0.0 - 0.1 K/uL    WBC Morphology ATYPICAL LYMPHOCYTES     BASIC METABOLIC PANEL     Status: Abnormal   Collection Time   01/10/12  1:35 PM      Component Value Range Comment   Sodium 135  135 - 145 mEq/L    Potassium 4.1  3.5 - 5.1 mEq/L    Chloride 96  96 - 112 mEq/L    CO2 26  19 - 32 mEq/L    Glucose, Bld 195 (*) 70 - 99 mg/dL    BUN 19  6 - 23 mg/dL    Creatinine, Ser 1.35  0.50 - 1.35 mg/dL    Calcium 8.9  8.4 - 10.5 mg/dL    GFR calc non Af Amer 59 (*) >90 mL/min    GFR calc Af Amer 69 (*) >90  mL/min   CULTURE, BLOOD (ROUTINE X 2)     Status: Normal (Preliminary result)   Collection Time   01/10/12  1:35 PM      Component Value Range Comment   Specimen Description BLOOD RIGHT ARM      Special Requests BOTTLES DRAWN AEROBIC AND ANAEROBIC 3CC      Culture  Setup Time 01/10/2012 22:46      Culture        Value:        BLOOD CULTURE RECEIVED NO GROWTH TO DATE CULTURE WILL BE HELD FOR 5 DAYS BEFORE ISSUING A FINAL NEGATIVE REPORT   Report Status PENDING     CULTURE, BLOOD (ROUTINE X 2)     Status: Normal (Preliminary result)   Collection Time   01/10/12  1:50 PM      Component Value Range Comment   Specimen Description BLOOD RIGHT ARM      Special Requests BOTTLES DRAWN AEROBIC AND ANAEROBIC 3CC      Culture  Setup Time 01/10/2012 22:47      Culture        Value:        BLOOD CULTURE RECEIVED NO GROWTH TO DATE CULTURE WILL BE HELD FOR 5 DAYS BEFORE ISSUING A FINAL NEGATIVE REPORT   Report Status PENDING     WOUND CULTURE     Status: Normal (Preliminary result)   Collection Time   01/10/12  3:31 PM      Component Value Range Comment   Specimen Description TOE      Special Requests Normal      Gram Stain        Value: NO WBC SEEN     NO SQUAMOUS EPITHELIAL CELLS SEEN     NO ORGANISMS SEEN   Culture Culture reincubated for better growth      Report Status PENDING     HEMOGLOBIN A1C     Status: Abnormal   Collection Time   01/10/12  6:30 PM        Component Value Range Comment   Hemoglobin A1C 9.8 (*) <5.7 %    Mean Plasma Glucose 235 (*) <117 mg/dL   GLUCOSE, CAPILLARY     Status: Abnormal   Collection Time   01/10/12  6:34 PM      Component Value Range Comment   Glucose-Capillary 210 (*) 70 - 99 mg/dL    Comment 1 Documented in Chart      Comment 2 Notify RN     GLUCOSE, CAPILLARY     Status: Abnormal   Collection Time   01/10/12 10:16 PM      Component Value Range Comment   Glucose-Capillary 236 (*) 70 - 99 mg/dL   COMPREHENSIVE METABOLIC PANEL     Status: Abnormal   Collection Time   01/11/12  4:25 AM      Component Value Range Comment   Sodium 132 (*) 135 - 145 mEq/L    Potassium 3.2 (*) 3.5 - 5.1 mEq/L DELTA CHECK NOTED   Chloride 97  96 - 112 mEq/L    CO2 25  19 - 32 mEq/L    Glucose, Bld 155 (*) 70 - 99 mg/dL    BUN 20  6 - 23 mg/dL    Creatinine, Ser 1.55 (*) 0.50 - 1.35 mg/dL    Calcium 8.5  8.4 - 10.5 mg/dL    Total Protein 6.5  6.0 - 8.3 g/dL    Albumin 1.9 (*) 3.5 - 5.2 g/dL  AST 16  0 - 37 U/L    ALT 17  0 - 53 U/L    Alkaline Phosphatase 90  39 - 117 U/L    Total Bilirubin 0.3  0.3 - 1.2 mg/dL    GFR calc non Af Amer 50 (*) >90 mL/min    GFR calc Af Amer 58 (*) >90 mL/min   CBC     Status: Abnormal   Collection Time   01/11/12  4:25 AM      Component Value Range Comment   WBC 10.0  4.0 - 10.5 K/uL    RBC 4.48  4.22 - 5.81 MIL/uL    Hemoglobin 10.9 (*) 13.0 - 17.0 g/dL    HCT 32.9 (*) 39.0 - 52.0 %    MCV 73.4 (*) 78.0 - 100.0 fL    MCH 24.3 (*) 26.0 - 34.0 pg    MCHC 33.1  30.0 - 36.0 g/dL    RDW 15.1  11.5 - 15.5 %    Platelets 315  150 - 400 K/uL   GLUCOSE, CAPILLARY     Status: Abnormal   Collection Time   01/11/12  7:56 AM      Component Value Range Comment   Glucose-Capillary 124 (*) 70 - 99 mg/dL   GLUCOSE, CAPILLARY     Status: Abnormal   Collection Time   01/11/12  1:21 PM      Component Value Range Comment   Glucose-Capillary 117 (*) 70 - 99 mg/dL   GLUCOSE, CAPILLARY      Status: Abnormal   Collection Time   01/11/12  4:38 PM      Component Value Range Comment   Glucose-Capillary 112 (*) 70 - 99 mg/dL   GLUCOSE, CAPILLARY     Status: Abnormal   Collection Time   01/11/12  9:43 PM      Component Value Range Comment   Glucose-Capillary 165 (*) 70 - 99 mg/dL    Comment 1 Notify RN     GLUCOSE, CAPILLARY     Status: Abnormal   Collection Time   01/12/12  7:42 AM      Component Value Range Comment   Glucose-Capillary 52 (*) 70 - 99 mg/dL   GLUCOSE, CAPILLARY     Status: Normal   Collection Time   01/12/12  8:37 AM      Component Value Range Comment   Glucose-Capillary 93  70 - 99 mg/dL     Dg Ankle Complete Right  01/10/2012  *RADIOLOGY REPORT*  Clinical Data: Medial ankle pain.  Swelling around the entire ankle.  Evaluate for osteomyelitis.  RIGHT ANKLE - COMPLETE 3+ VIEW  Comparison: No priors.  Findings: Extensive soft tissue swelling around the entire ankle is noted, slightly asymmetric (medial greater than lateral). 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  No acute displaced fracture, subluxation or dislocation. No definite lytic bony lesions to strongly suggest presence of osteomyelitis.  Scattered areas of heterotopic bone are noted, most pronounced in the dorsal aspect of the foot adjacent to the midfoot articulation with the metatarsals.  Pes planus.  IMPRESSION: 1.  No definite signs to suggest osteomyelitis.  There is extensive soft tissue swelling around the ankle joint, as above.   Original Report Authenticated By: Vinnie Langton, M.D.    Mr Foot Right W Wo Contrast  01/11/2012  *RADIOLOGY REPORT*  Clinical Data: Foot pain and swelling mainly involving the second toe.  MRI OF THE RIGHT FOREFOOT WITHOUT AND WITH CONTRAST  Technique:  Multiplanar, multisequence MR imaging was performed both before and after administration of intravenous contrast.  Contrast: 18mL MULTIHANCE GADOBENATE DIMEGLUMINE 529 MG/ML IV SOLN   Comparison: None.  Findings: There is diffuse soft tissue swelling surrounding the second digit consistent with cellulitis.  No discrete soft tissue abscess.  There is a signal abnormality and abnormal enhancement in the middle and distal phalanges consistent with osteomyelitis.  The proximal phalanx is normal.  In the mid foot region there is signal abnormality in the second and fourth metatarsals.  Mild enhancement is also suggested.  There is diffuse midfoot myositis but no focal soft tissue abscess/pyomyositis.  The metatarsal phalangeal joints appear intact.  IMPRESSION:  1.  Diffuse cellulitis involving the second toe without discrete soft tissue abscess. 2.  Osteomyelitis involving the middle and distal phalanges of the second toe.  Septic arthritis involving the distal interphalangeal joint is possible. 3.  Signal abnormality and enhancement in the second and fourth metatarsals is suspicious for osteomyelitis.  There is surrounding midfoot myositis.   Original Report Authenticated By: Marijo Sanes, M.D.    Mr Ankle Right W Wo Contrast  01/12/2012  *RADIOLOGY REPORT*  Clinical Data:  Erythema, pain, and swelling in the ankle.  MRI OF THE RIGHT ANKLE WITH AND WITHOUT CONTRAST  Technique:  Multiplanar, multisequence MR imaging of the right ankle was performed before and after the administration of intravenous contrast.  Contrast: 76mL MULTIHANCE GADOBENATE DIMEGLUMINE 529 MG/ML IV SOLN  Comparison:  MRI of the right forefoot from 01/11/2012; radiographs dated 01/10/2012  Findings: Diffuse subcutaneous edema is observed in the ankle with mild associated enhancement.  Achilles tendon unremarkable.  Medial and lateral band of the plantar fascia appear mildly thickened and with adjacent edema proximally favoring plantar fasciitis.  There is abnormal edema signal throughout the navicular, cuboid, cuneiforms, and proximal portions of the second and fourth metatarsals.  Subcortical cystic lesions with mild  articular surface irregularities are observed along several mid foot articulations including the articulation between the navicular and medial cuneiform, the articulation between the middle cuneiform and base of the second metatarsal, and at the articulation between the cuboid and the base of the fourth metatarsal.  Low-level edema and enhancement is present anteriorly in the lateral portion of the calcaneus.  Date degenerative subcortical lesion is present posteriorly in the tibial plafond  The inferior tibiofibular ligaments appear intact.  The anterior talofibular ligament is indistinct.  The remainder of the lateral ligamentous complex appears intact.  The deltoid ligament appears grossly intact.  Mildly thickened superomedial component of the spring ligament noted, with adjacent distal tibialis posterior tenosynovitis and tendinopathy. Indistinct inferoplantar longitudinal component of the spring ligament could be torn.  Recent radiography revealed heterotopic ossification dorsal to the Lisfranc joint.  IMPRESSION:  1.  Diffuse low-level edema and enhancement in the midfoot, with subcortical lesions of multiple locations in the midfoot and along the Lisfranc joint, and potentially some early fragmentation along these more degenerated articulations. 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 2.  Mild dorsal heterotopic ossification along the Lisfranc joint. 3.  Tibialis posterior distal tendinopathy and tenosynovitis, correlate clinically in assessing for tibialis posterior dysfunction. 4.  Plantar fasciitis. 5.  The anterior talofibular ligament is somewhat indistinct and could be sprained. 6.  Inferior components of the spring ligament appear indistinct and potentially discontinuous.   Original Report Authenticated By: Van Clines, M.D.     Review of Systems  All other systems  reviewed and are negative.   Blood pressure 159/95, pulse 88, temperature 97.8 F (36.6 C), temperature source Oral, resp. rate 16, height 6' (1.829 m), weight 122.471 kg (270 lb), SpO2 100.00%. Physical Exam On examination patient has a good dorsalis pedis and posterior tibial pulse. He had sausage digit swelling of the right foot second toe. The nail was removed. There is exposed bone distally. Review of the MRI scan shows no abscess but does show chronic osteomyelitis of the second toe. Assessment/Plan: Assessment: Chronic osteomyelitis right foot second toe.  Plan: I feel patient can be discharged to home today on by mouth doxycycline. I will followup in the office this next week and plan for amputation of the right foot second toe and metatarsal  Kyrollos Cordell,Alter V 01/12/2012, 9:02 AM

## 2012-01-13 LAB — WOUND CULTURE

## 2012-01-15 ENCOUNTER — Telehealth: Payer: Self-pay

## 2012-01-15 NOTE — Telephone Encounter (Signed)
Called pt to get him to come in for follow up apt had to leave message for pt to have apt to check renal function and adjust B/P meds

## 2012-01-16 LAB — CULTURE, BLOOD (ROUTINE X 2): Culture: NO GROWTH

## 2012-01-19 ENCOUNTER — Ambulatory Visit (INDEPENDENT_AMBULATORY_CARE_PROVIDER_SITE_OTHER): Payer: No Typology Code available for payment source | Admitting: Family Medicine

## 2012-01-19 ENCOUNTER — Encounter: Payer: Self-pay | Admitting: Family Medicine

## 2012-01-19 VITALS — BP 124/82 | HR 80 | Wt 275.0 lb

## 2012-01-19 DIAGNOSIS — E119 Type 2 diabetes mellitus without complications: Secondary | ICD-10-CM

## 2012-01-19 DIAGNOSIS — L0390 Cellulitis, unspecified: Secondary | ICD-10-CM

## 2012-01-19 DIAGNOSIS — M869 Osteomyelitis, unspecified: Secondary | ICD-10-CM

## 2012-01-19 LAB — COMPREHENSIVE METABOLIC PANEL
AST: 21 U/L (ref 0–37)
Alkaline Phosphatase: 77 U/L (ref 39–117)
BUN: 22 mg/dL (ref 6–23)
Creat: 1.6 mg/dL — ABNORMAL HIGH (ref 0.50–1.35)
Glucose, Bld: 92 mg/dL (ref 70–99)

## 2012-01-19 NOTE — Progress Notes (Signed)
  Subjective:    Patient ID: Austin Kramer, male    DOB: [birth year], 51 y.o.   MRN: [id number]  HPI He is here for followup. He was recently hospitalized and treated for sialitis and osteomyelitis. He is scheduled to see his orthopedic surgeon for surgery soon. In the hospital he had difficulty with blood pressure and slightly elevated creatinine. Apparently his potassium levels were also low. He continues to complain of swelling and discomfort as well as slight bloody drainage from the toe.   Review of Systems     Objective:   Physical Exam Alert and in no distress. Right lower extremity is swollen slightly warm slightly uncomfortable. He does have drainage from the second toe.       Assessment & Plan:   1. Osteomyelitis of toe  Comprehensive metabolic panel  2. Diabetes mellitus  Comprehensive metabolic panel  3. Cellulitis  Comprehensive metabolic panel   continue on present antibiotics. He is to keep his leg elevated as much is possible. Note for work given for the next 2 weeks. He was also given a new glucometer.  I will hold antihypertensive medicine until after his surgery when he becomes more active. He seems to be holding his blood pressure on his own right now

## 2012-01-23 ENCOUNTER — Other Ambulatory Visit (HOSPITAL_COMMUNITY): Payer: Self-pay | Admitting: Orthopedic Surgery

## 2012-01-25 ENCOUNTER — Encounter (HOSPITAL_COMMUNITY): Payer: Self-pay

## 2012-01-25 ENCOUNTER — Encounter (HOSPITAL_COMMUNITY): Payer: Self-pay | Admitting: Respiratory Therapy

## 2012-01-25 MED ORDER — DEXTROSE 5 % IV SOLN
3.0000 g | INTRAVENOUS | Status: AC
  Administered 2012-01-26: 3 g via INTRAVENOUS
  Filled 2012-01-25: qty 3000

## 2012-01-26 ENCOUNTER — Encounter (HOSPITAL_COMMUNITY): Payer: Self-pay | Admitting: *Deleted

## 2012-01-26 ENCOUNTER — Ambulatory Visit (HOSPITAL_COMMUNITY): Payer: No Typology Code available for payment source

## 2012-01-26 ENCOUNTER — Encounter (HOSPITAL_COMMUNITY)
Admission: RE | Disposition: A | Discharge status: Home / Self Care | Payer: Self-pay | Source: Ambulatory Visit | Attending: Orthopedic Surgery

## 2012-01-26 ENCOUNTER — Encounter (HOSPITAL_COMMUNITY): Payer: Self-pay | Admitting: Anesthesiology

## 2012-01-26 ENCOUNTER — Ambulatory Visit (HOSPITAL_COMMUNITY): Payer: No Typology Code available for payment source | Admitting: Anesthesiology

## 2012-01-26 ENCOUNTER — Ambulatory Visit (HOSPITAL_COMMUNITY)
Admission: RE | Admit: 2012-01-26 | Discharge: 2012-01-26 | Disposition: A | Discharge status: Home / Self Care | Payer: No Typology Code available for payment source | Source: Ambulatory Visit | Attending: Orthopedic Surgery | Admitting: Orthopedic Surgery

## 2012-01-26 DIAGNOSIS — E119 Type 2 diabetes mellitus without complications: Secondary | ICD-10-CM

## 2012-01-26 DIAGNOSIS — Z833 Family history of diabetes mellitus: Secondary | ICD-10-CM | POA: Insufficient documentation

## 2012-01-26 DIAGNOSIS — Z794 Long term (current) use of insulin: Secondary | ICD-10-CM | POA: Insufficient documentation

## 2012-01-26 DIAGNOSIS — I10 Essential (primary) hypertension: Secondary | ICD-10-CM | POA: Insufficient documentation

## 2012-01-26 DIAGNOSIS — M9080 Osteopathy in diseases classified elsewhere, unspecified site: Secondary | ICD-10-CM | POA: Insufficient documentation

## 2012-01-26 DIAGNOSIS — G5790 Unspecified mononeuropathy of unspecified lower limb: Secondary | ICD-10-CM | POA: Insufficient documentation

## 2012-01-26 DIAGNOSIS — E1169 Type 2 diabetes mellitus with other specified complication: Secondary | ICD-10-CM | POA: Insufficient documentation

## 2012-01-26 DIAGNOSIS — N529 Male erectile dysfunction, unspecified: Secondary | ICD-10-CM | POA: Insufficient documentation

## 2012-01-26 DIAGNOSIS — M86679 Other chronic osteomyelitis, unspecified ankle and foot: Secondary | ICD-10-CM | POA: Insufficient documentation

## 2012-01-26 DIAGNOSIS — Z8545 Personal history of malignant neoplasm of unspecified male genital organ: Secondary | ICD-10-CM | POA: Insufficient documentation

## 2012-01-26 DIAGNOSIS — E669 Obesity, unspecified: Secondary | ICD-10-CM | POA: Insufficient documentation

## 2012-01-26 DIAGNOSIS — E1159 Type 2 diabetes mellitus with other circulatory complications: Secondary | ICD-10-CM | POA: Insufficient documentation

## 2012-01-26 DIAGNOSIS — M869 Osteomyelitis, unspecified: Secondary | ICD-10-CM

## 2012-01-26 DIAGNOSIS — I798 Other disorders of arteries, arterioles and capillaries in diseases classified elsewhere: Secondary | ICD-10-CM | POA: Insufficient documentation

## 2012-01-26 DIAGNOSIS — Z6836 Body mass index (BMI) 36.0-36.9, adult: Secondary | ICD-10-CM | POA: Insufficient documentation

## 2012-01-26 HISTORY — PX: AMPUTATION: SHX166

## 2012-01-26 LAB — COMPREHENSIVE METABOLIC PANEL
ALT: 16 U/L (ref 0–53)
AST: 21 U/L (ref 0–37)
Alkaline Phosphatase: 85 U/L (ref 39–117)
CO2: 25 mEq/L (ref 19–32)
Chloride: 100 mEq/L (ref 96–112)
Creatinine, Ser: 1.66 mg/dL — ABNORMAL HIGH (ref 0.50–1.35)
GFR calc non Af Amer: 46 mL/min — ABNORMAL LOW (ref >90)
Potassium: 4.4 mEq/L (ref 3.5–5.1)
Sodium: 137 mEq/L (ref 135–145)
Total Bilirubin: 0.2 mg/dL — ABNORMAL LOW (ref 0.3–1.2)

## 2012-01-26 LAB — APTT: aPTT: 30 seconds (ref 24–37)

## 2012-01-26 LAB — GLUCOSE, CAPILLARY: Glucose-Capillary: 164 mg/dL — ABNORMAL HIGH (ref 70–99)

## 2012-01-26 LAB — CBC
MCV: 74.2 fL — ABNORMAL LOW (ref 78.0–100.0)
Platelets: 344 10*3/uL (ref 150–400)
RBC: 4.84 MIL/uL (ref 4.22–5.81)
WBC: 7.9 10*3/uL (ref 4.0–10.5)

## 2012-01-26 SURGERY — AMPUTATION, FOOT, RAY
Anesthesia: General | Site: Toe | Laterality: Right | Wound class: Dirty or Infected

## 2012-01-26 MED ORDER — 0.9 % SODIUM CHLORIDE (POUR BTL) OPTIME
TOPICAL | Status: DC | PRN
  Administered 2012-01-26: 1000 mL

## 2012-01-26 MED ORDER — HYDROCODONE-ACETAMINOPHEN 5-500 MG PO TABS: 1.0000 | ORAL_TABLET | Freq: Four times a day (QID) | ORAL | Status: DC | PRN

## 2012-01-26 MED ORDER — ONDANSETRON HCL 4 MG/2ML IJ SOLN
4.0000 mg | Freq: Once | INTRAMUSCULAR | Status: AC
  Administered 2012-01-26: 4 mg via INTRAVENOUS

## 2012-01-26 MED ORDER — ONDANSETRON HCL 4 MG/2ML IJ SOLN
INTRAMUSCULAR | Status: AC
  Filled 2012-01-26: qty 2

## 2012-01-26 MED ORDER — FENTANYL CITRATE 0.05 MG/ML IJ SOLN
INTRAMUSCULAR | Status: DC | PRN
  Administered 2012-01-26: 50 ug via INTRAVENOUS
  Administered 2012-01-26 (×2): 75 ug via INTRAVENOUS
  Administered 2012-01-26: 50 ug via INTRAVENOUS

## 2012-01-26 MED ORDER — OXYCODONE HCL 5 MG PO TABS: 5.0000 mg | ORAL_TABLET | Freq: Once | ORAL | Status: DC | PRN

## 2012-01-26 MED ORDER — LACTATED RINGERS IV SOLN
INTRAVENOUS | Status: DC
  Administered 2012-01-26: 15:00:00 via INTRAVENOUS

## 2012-01-26 MED ORDER — OXYCODONE HCL 5 MG/5ML PO SOLN: 5.0000 mg | Freq: Once | ORAL | Status: DC | PRN

## 2012-01-26 MED ORDER — HYDROMORPHONE HCL PF 1 MG/ML IJ SOLN
INTRAMUSCULAR | Status: AC
  Filled 2012-01-26: qty 2

## 2012-01-26 MED ORDER — PROMETHAZINE HCL 25 MG/ML IJ SOLN: 6.2500 mg | INTRAMUSCULAR | Status: DC | PRN

## 2012-01-26 MED ORDER — HYDROMORPHONE HCL PF 1 MG/ML IJ SOLN
0.2500 mg | INTRAMUSCULAR | Status: DC | PRN
  Administered 2012-01-26 (×3): 0.5 mg via INTRAVENOUS

## 2012-01-26 MED ORDER — PROPOFOL 10 MG/ML IV BOLUS
INTRAVENOUS | Status: DC | PRN
  Administered 2012-01-26: 170 mg via INTRAVENOUS

## 2012-01-26 MED ORDER — MEPERIDINE HCL 25 MG/ML IJ SOLN: 6.2500 mg | INTRAMUSCULAR | Status: DC | PRN

## 2012-01-26 MED ORDER — LIDOCAINE HCL (CARDIAC) 20 MG/ML IV SOLN
INTRAVENOUS | Status: DC | PRN
  Administered 2012-01-26: 40 mg via INTRAVENOUS

## 2012-01-26 MED ORDER — MIDAZOLAM HCL 5 MG/5ML IJ SOLN
INTRAMUSCULAR | Status: DC | PRN
  Administered 2012-01-26: 2 mg via INTRAVENOUS

## 2012-01-26 SURGICAL SUPPLY — 46 items
BANDAGE ESMARK 6X9 LF (GAUZE/BANDAGES/DRESSINGS) IMPLANT
BANDAGE GAUZE ELAST BULKY 4 IN (GAUZE/BANDAGES/DRESSINGS) ×2 IMPLANT
BLADE SAW SGTL MED 73X18.5 STR (BLADE) IMPLANT
BNDG COHESIVE 4X5 TAN STRL (GAUZE/BANDAGES/DRESSINGS) IMPLANT
BNDG COHESIVE 6X5 TAN STRL LF (GAUZE/BANDAGES/DRESSINGS) IMPLANT
BNDG ESMARK 6X9 LF (GAUZE/BANDAGES/DRESSINGS)
CLOTH BEACON ORANGE TIMEOUT ST (SAFETY) ×2 IMPLANT
CUFF TOURNIQUET SINGLE 34IN LL (TOURNIQUET CUFF) IMPLANT
CUFF TOURNIQUET SINGLE 44IN (TOURNIQUET CUFF) IMPLANT
DRAPE U-SHAPE 47X51 STRL (DRAPES) ×2 IMPLANT
DRSG ADAPTIC 3X8 NADH LF (GAUZE/BANDAGES/DRESSINGS) ×2 IMPLANT
DRSG PAD ABDOMINAL 8X10 ST (GAUZE/BANDAGES/DRESSINGS) ×2 IMPLANT
DURAPREP 26ML APPLICATOR (WOUND CARE) ×2 IMPLANT
ELECT REM PT RETURN 9FT ADLT (ELECTROSURGICAL) ×2
ELECTRODE REM PT RTRN 9FT ADLT (ELECTROSURGICAL) ×1 IMPLANT
GLOVE BIOGEL PI IND STRL 7.0 (GLOVE) ×1 IMPLANT
GLOVE BIOGEL PI IND STRL 9 (GLOVE) ×1 IMPLANT
GLOVE BIOGEL PI INDICATOR 7.0 (GLOVE) ×1
GLOVE BIOGEL PI INDICATOR 9 (GLOVE) ×1
GLOVE SURG ORTHO 9.0 STRL STRW (GLOVE) ×2 IMPLANT
GLOVE SURG SS PI 6.5 STRL IVOR (GLOVE) ×4 IMPLANT
GOWN PREVENTION PLUS XLARGE (GOWN DISPOSABLE) ×2 IMPLANT
GOWN SRG XL XLNG 56XLVL 4 (GOWN DISPOSABLE) ×1 IMPLANT
GOWN STRL NON-REIN XL XLG LVL4 (GOWN DISPOSABLE) ×1
KIT BASIN OR (CUSTOM PROCEDURE TRAY) ×2 IMPLANT
KIT ROOM TURNOVER OR (KITS) ×2 IMPLANT
MANIFOLD NEPTUNE II (INSTRUMENTS) ×2 IMPLANT
NS IRRIG 1000ML POUR BTL (IV SOLUTION) ×2 IMPLANT
PACK GENERAL/GYN (CUSTOM PROCEDURE TRAY) ×2 IMPLANT
PACK ORTHO EXTREMITY (CUSTOM PROCEDURE TRAY) IMPLANT
PAD ARMBOARD 7.5X6 YLW CONV (MISCELLANEOUS) ×2 IMPLANT
PAD CAST 4YDX4 CTTN HI CHSV (CAST SUPPLIES) IMPLANT
PADDING CAST COTTON 4X4 STRL (CAST SUPPLIES)
SPONGE GAUZE 4X4 12PLY (GAUZE/BANDAGES/DRESSINGS) ×2 IMPLANT
SPONGE LAP 18X18 X RAY DECT (DISPOSABLE) IMPLANT
STAPLER VISISTAT 35W (STAPLE) IMPLANT
STOCKINETTE IMPERVIOUS LG (DRAPES) ×2 IMPLANT
SUCTION FRAZIER TIP 10 FR DISP (SUCTIONS) ×2 IMPLANT
SUT ETHILON 2 0 PSLX (SUTURE) ×2 IMPLANT
SUT PDS AB 1 CT  36 (SUTURE)
SUT PDS AB 1 CT 36 (SUTURE) IMPLANT
TOWEL OR 17X24 6PK STRL BLUE (TOWEL DISPOSABLE) ×2 IMPLANT
TOWEL OR 17X26 10 PK STRL BLUE (TOWEL DISPOSABLE) ×2 IMPLANT
TUBE CONNECTING 12X1/4 (SUCTIONS) ×2 IMPLANT
UNDERPAD 30X30 INCONTINENT (UNDERPADS AND DIAPERS) IMPLANT
WATER STERILE IRR 1000ML POUR (IV SOLUTION) IMPLANT

## 2012-01-26 NOTE — Progress Notes (Signed)
Orthopedic Tech Progress Note Patient Details:  Austin Kramer 09-Sep-1960 [id number]  Patient ID: Austin Kramer, male   DOB: 09-09-1960, 51 y.o.   MRN: [id number] Viewed order from rn order list  Hildred Priest 01/26/2012, 5:33 PM

## 2012-01-26 NOTE — Progress Notes (Signed)
Assisted up to wheel  Chair without difficulty. Pt has post op boot on right foot and crutches Pt got in chair and vomited again , at 1830 drank gingerale too fast and vomited. Stated it tastes like ginger ale.coming back up. Dr. Linna Caprice called orders received to have zofran 4mg  IVP , none received in OR.

## 2012-01-26 NOTE — H&P (Signed)
Austin Kramer is an 51 y.o. male.   Chief Complaint: Osteomyelitis abscess right foot second toe HPI: Patient is a 51 year old diabetic peripheral vascular disease who has developed progressive ulceration osteomyelitis and abscess of the right foot second toe he has failed conservative treatment.  Past Medical History  Diagnosis Date  . Diabetes mellitus   . ED (erectile dysfunction)   . Obesity   . Cancer     PROSTATE  . PPD positive, treated   . Hypertension     [name]  . Neuromuscular disorder     neuropathy, "tingling in feet"    Past Surgical History  Procedure Date  . Vasectomy   . Prostatectomy 2011  . Knee cartilage surgery     Family History  Problem Relation Age of Onset  . Diabetes Mother   . Diabetes Father    Social History:  reports that he has never smoked. He has never used smokeless tobacco. He reports that he does not drink alcohol or use illicit drugs.  Allergies: No Known Allergies  Medications Prior to Admission  Medication Sig Dispense Refill  . amitriptyline (ELAVIL) 10 MG tablet Take 1 tablet (10 mg total) by mouth at bedtime.  30 tablet  11  . amLODipine (NORVASC) 5 MG tablet Take 2 tablets (10 mg total) by mouth daily.  30 tablet  0  . doxycycline (VIBRA-TABS) 100 MG tablet Take 1 tablet (100 mg total) by mouth 2 (two) times daily.  24 tablet  0  . ferrous sulfate 325 (65 FE) MG tablet Take 325 mg by mouth daily with breakfast.      . fish oil-omega-3 fatty acids 1000 MG capsule Take 1 g by mouth daily.      Marland Kitchen HYDROcodone-acetaminophen (NORCO/VICODIN) 5-325 MG per tablet Take 1-2 tablets by mouth every 4 (four) hours as needed.  30 tablet  0  . insulin detemir (LEVEMIR FLEXPEN) 100 UNIT/ML injection Inject 50 Units into the skin 2 (two) times daily.  6 mL  0  . insulin lispro (HUMALOG) 100 UNIT/ML injection Inject 20-25 Units into the skin 3 (three) times daily as needed. For low blood sugar as needed. Sliding scale      .  Multiple Vitamin (MULTIVITAMIN WITH MINERALS) TABS Take 1 tablet by mouth daily.      . rosuvastatin (CRESTOR) 20 MG tablet Take 1 tablet (20 mg total) by mouth daily.  42 tablet  0    Results for orders placed during the hospital encounter of 01/26/12 (from the past 48 hour(s))  GLUCOSE, CAPILLARY     Status: Abnormal   Collection Time   01/26/12 12:14 PM      Component Value Range Comment   Glucose-Capillary 164 (*) 70 - 99 mg/dL   APTT     Status: Normal   Collection Time   01/26/12 12:46 PM      Component Value Range Comment   aPTT 30  24 - 37 seconds   CBC     Status: Abnormal   Collection Time   01/26/12 12:46 PM      Component Value Range Comment   WBC 7.9  4.0 - 10.5 K/uL    RBC 4.84  4.22 - 5.81 MIL/uL    Hemoglobin 11.5 (*) 13.0 - 17.0 g/dL    HCT 35.9 (*) 39.0 - 52.0 %    MCV 74.2 (*) 78.0 - 100.0 fL    MCH 23.8 (*) 26.0 - 34.0 pg    MCHC  32.0  30.0 - 36.0 g/dL    RDW 15.1  11.5 - 15.5 %    Platelets 344  150 - 400 K/uL   COMPREHENSIVE METABOLIC PANEL     Status: Abnormal   Collection Time   01/26/12 12:46 PM      Component Value Range Comment   Sodium 137  135 - 145 mEq/L    Potassium 4.4  3.5 - 5.1 mEq/L    Chloride 100  96 - 112 mEq/L    CO2 25  19 - 32 mEq/L    Glucose, Bld 174 (*) 70 - 99 mg/dL    BUN 24 (*) 6 - 23 mg/dL    Creatinine, Ser 1.66 (*) 0.50 - 1.35 mg/dL    Calcium 9.2  8.4 - 10.5 mg/dL    Total Protein 7.4  6.0 - 8.3 g/dL    Albumin 2.4 (*) 3.5 - 5.2 g/dL    AST 21  0 - 37 U/L    ALT 16  0 - 53 U/L    Alkaline Phosphatase 85  39 - 117 U/L    Total Bilirubin 0.2 (*) 0.3 - 1.2 mg/dL    GFR calc non Af Amer 46 (*) >90 mL/min    GFR calc Af Amer 54 (*) >90 mL/min   PROTIME-INR     Status: Normal   Collection Time   01/26/12 12:46 PM      Component Value Range Comment   Prothrombin Time 13.4  11.6 - 15.2 seconds    INR 1.03  0.00 - 1.49   GLUCOSE, CAPILLARY     Status: Abnormal   Collection Time   01/26/12  2:11 PM      Component Value  Range Comment   Glucose-Capillary 150 (*) 70 - 99 mg/dL    Dg Chest 2 View  01/26/2012  *RADIOLOGY REPORT*  Clinical Data: Preop evaluation.  History of toe osteomyelitis.  CHEST - 2 VIEW  Comparison: None.  Findings: Two views of the chest demonstrate slightly low lung volumes.  Few densities at the left lung base could represent scarring or atelectasis.  No evidence for airspace disease or edema.  Heart and mediastinum are within normal limits.  No evidence for pleural effusions.  Bony thorax is intact.  Surgical screw in the right scapular region.  IMPRESSION: Left basilar densities may represent scar or atelectasis. Otherwise, no acute chest findings.   Original Report Authenticated By: Markus Daft, M.D.     Review of Systems  All other systems reviewed and are negative.    Blood pressure 140/96, pulse 102, temperature 98.2 F (36.8 C), temperature source Oral, resp. rate 20, height 6' (1.829 m), weight 122.471 kg (270 lb), SpO2 100.00%. Physical Exam  On examination patient has a palpable dorsalis pedis pulse he has sausage digit swelling of the right foot second toe with ulceration probe to bone. There are lytic changes the bone consistent with osteomyelitis. Assessment/Plan Assessment: Osteomyelitis abscess right foot second toe.  Plan: Will plan for a right foot second ray amputation. Risks and benefits were discussed including persistent infection nonhealing of the wound need for higher level amputation. Patient states he understands and wished to proceed at this time.  Hortense Cantrall,Justinian V 01/26/2012, 3:28 PM

## 2012-01-26 NOTE — Anesthesia Preprocedure Evaluation (Addendum)
Anesthesia Evaluation  Patient identified by MRN, date of birth, ID band Patient awake    Reviewed: Allergy & Precautions, H&P , NPO status , Patient's Chart, lab work & pertinent test results  History of Anesthesia Complications Negative for: history of anesthetic complications  Airway Mallampati: I  Neck ROM: Full    Dental   Pulmonary neg pulmonary ROS,  breath sounds clear to auscultation        Cardiovascular hypertension, Pt. on medications Rhythm:Regular Rate:Normal     Neuro/Psych    GI/Hepatic negative GI ROS, Neg liver ROS,   Endo/Other  diabetes, Well Controlled, Insulin Dependent  Renal/GU Renal InsufficiencyRenal diseasenegative Renal ROS     Musculoskeletal   Abdominal   Peds  Hematology   Anesthesia Other Findings   Reproductive/Obstetrics                          Anesthesia Physical Anesthesia Plan  ASA: III  Anesthesia Plan: General   Post-op Pain Management:    Induction: Intravenous  Airway Management Planned: LMA  Additional Equipment:   Intra-op Plan:   Post-operative Plan:   Informed Consent: I have reviewed the patients History and Physical, chart, labs and discussed the procedure including the risks, benefits and alternatives for the proposed anesthesia with the patient or authorized representative who has indicated his/her understanding and acceptance.     Plan Discussed with: CRNA and Surgeon  Anesthesia Plan Comments:         Anesthesia Quick Evaluation

## 2012-01-26 NOTE — Anesthesia Postprocedure Evaluation (Signed)
  Anesthesia Post-op Note  Patient: Austin Kramer  Procedure(s) Performed: Procedure(s) (LRB) with comments: AMPUTATION RAY (Right) - Right foot 2nd ray amputation  Patient Location: PACU  Anesthesia Type:General  Level of Consciousness: awake, alert  and oriented  Airway and Oxygen Therapy: Patient Spontanous Breathing and Patient connected to nasal cannula oxygen  Post-op Pain: mild  Post-op Assessment: Post-op Vital signs reviewed, Patient's Cardiovascular Status Stable and Respiratory Function Stable  Post-op Vital Signs: stable  Complications: No apparent anesthesia complications

## 2012-01-26 NOTE — Anesthesia Procedure Notes (Signed)
Procedure Name: LMA Insertion Date/Time: 01/26/2012 4:11 PM Performed by: Marinda Elk A Pre-anesthesia Checklist: Patient identified, Timeout performed, Emergency Drugs available, Suction available and Patient being monitored Patient Re-evaluated:Patient Re-evaluated prior to inductionOxygen Delivery Method: Circle system utilized Preoxygenation: Pre-oxygenation with 100% oxygen Intubation Type: IV induction Ventilation: Mask ventilation without difficulty LMA Size: 5.0 Number of attempts: 1 Placement Confirmation: breath sounds checked- equal and bilateral and positive ETCO2 Tube secured with: Tape Dental Injury: Teeth and Oropharynx as per pre-operative assessment

## 2012-01-26 NOTE — Op Note (Signed)
OPERATIVE REPORT  DATE OF SURGERY: 01/26/2012  PATIENT:  Austin Kramer,  51 y.o. male  PRE-OPERATIVE DIAGNOSIS:  Osteomyelitis right 2nd toe  POST-OPERATIVE DIAGNOSIS:  Osteomyelitis right 2nd toe  PROCEDURE:  Procedure(s): AMPUTATION RAY right foot second ray  SURGEON:  Surgeon(s): Newt Minion, MD  ANESTHESIA:   general  EBL:  Minimal ML  SPECIMEN:  No Specimen  TOURNIQUET:  * No tourniquets in log *  PROCEDURE DETAILS: Patient is a 51 year old gentleman diabetic insensate neuropathy with abscess ulcer osteomyelitis right foot second toe. He has failed conservative treatment and presents at this time for amputation. Risks and benefits were discussed including persistent infection nonhealing of the wound need for additional surgery. Patient states he understands and wished to proceed at this time. Description of procedure patient was brought to the operating room and underwent a general anesthetic. After adequate levels of anesthesia were obtained patient's right lower extremity was prepped using DuraPrep draped into a sterile field. A racquet incision was made around the toe extending proximally dorsally. There is no plantar incision. The second metatarsal was resected through the mid shaft. The infected toe and metatarsal were resected in one block of tissue. The wound is irrigated with normal saline. Hemostasis was obtained. The incision was closed using 2-0 nylon. The wound was covered with Adaptic orthopedic sponges ABDs dressing Kerlix and Coban. Patient was extubated taken to the PACU in stable condition.  PLAN OF CARE: Discharge to home after PACU  PATIENT DISPOSITION:  PACU - hemodynamically stable.   Newt Minion, MD 01/26/2012 4:46 PM

## 2012-01-26 NOTE — Transfer of Care (Signed)
Immediate Anesthesia Transfer of Care Note  Patient: Austin Kramer  Procedure(s) Performed: Procedure(s) (LRB) with comments: AMPUTATION RAY (Right) - Right foot 2nd ray amputation  Patient Location: PACU  Anesthesia Type:General  Level of Consciousness: sedated  Airway & Oxygen Therapy: Patient Spontanous Breathing and Patient connected to nasal cannula oxygen  Post-op Assessment: Report given to PACU RN and Post -op Vital signs reviewed and stable  Post vital signs: Reviewed and stable  Complications: No apparent anesthesia complications

## 2012-01-26 NOTE — Progress Notes (Signed)
Orthopedic Tech Progress Note Patient Details:  Austin Kramer 17-Feb-1960 [id number]  Ortho Devices Type of Ortho Device: Crutches;Postop shoe/boot Ortho Device/Splint Location: right foot Ortho Device/Splint Interventions: Application   Hildred Priest 01/26/2012, 5:31 PM

## 2012-01-29 ENCOUNTER — Encounter (HOSPITAL_COMMUNITY): Payer: Self-pay | Admitting: Orthopedic Surgery

## 2012-01-30 ENCOUNTER — Telehealth: Payer: Self-pay | Admitting: Internal Medicine

## 2012-01-30 DIAGNOSIS — E1140 Type 2 diabetes mellitus with diabetic neuropathy, unspecified: Secondary | ICD-10-CM

## 2012-01-30 MED ORDER — AMLODIPINE BESYLATE 5 MG PO TABS: 10.0000 mg | ORAL_TABLET | Freq: Every day | ORAL | Status: DC

## 2012-01-30 MED ORDER — DOXYCYCLINE HYCLATE 100 MG PO TABS: 100.0000 mg | ORAL_TABLET | Freq: Two times a day (BID) | ORAL | Status: DC

## 2012-01-30 MED ORDER — AMITRIPTYLINE HCL 10 MG PO TABS: 10.0000 mg | ORAL_TABLET | Freq: Every day | ORAL | Status: DC

## 2012-01-30 NOTE — Telephone Encounter (Signed)
Pt needs a refill on doxcycline 100mg  to [facility] on [address]

## 2012-01-30 NOTE — Telephone Encounter (Signed)
done

## 2012-02-08 ENCOUNTER — Telehealth: Payer: Self-pay | Admitting: Family Medicine

## 2012-02-08 NOTE — Telephone Encounter (Signed)
Patient called stating his Rx's he requested on 12/17 were not at pharmacy. Per our system Rx's were sent, called [facility], they had no record of Rx's. Gave verbal Rx's for elavil, Norvasc and doxycycline Patient informed

## 2012-02-28 ENCOUNTER — Encounter: Payer: No Typology Code available for payment source | Admitting: Medical

## 2012-03-08 ENCOUNTER — Encounter: Payer: Self-pay | Admitting: Internal Medicine

## 2012-03-13 ENCOUNTER — Other Ambulatory Visit: Payer: Self-pay | Admitting: Family Medicine

## 2012-03-14 ENCOUNTER — Encounter: Payer: No Typology Code available for payment source | Admitting: Family Medicine

## 2012-03-14 NOTE — Telephone Encounter (Signed)
Called pt to see if he needs this med refilled and what this med is being used for, since he canceled his appt today

## 2012-03-14 NOTE — Telephone Encounter (Signed)
Is this ok?

## 2012-04-03 ENCOUNTER — Telehealth: Payer: Self-pay | Admitting: Internal Medicine

## 2012-04-03 MED ORDER — ROSUVASTATIN CALCIUM 20 MG PO TABS: 20.0000 mg | ORAL_TABLET | Freq: Every day | ORAL | Status: DC

## 2012-04-03 NOTE — Telephone Encounter (Signed)
Pt called and wanted samples of crestor 20mg  and levemir. No samples of levemir but will give pt 21 days samples of crestor 20mg . Pt notified and will come and pick up samples

## 2012-07-12 ENCOUNTER — Ambulatory Visit
Admission: RE | Admit: 2012-07-12 | Discharge: 2012-07-12 | Disposition: A | Discharge status: Home / Self Care | Payer: No Typology Code available for payment source | Source: Ambulatory Visit | Attending: Family Medicine | Admitting: Family Medicine

## 2012-07-12 ENCOUNTER — Encounter: Payer: Self-pay | Admitting: Internal Medicine

## 2012-07-12 ENCOUNTER — Other Ambulatory Visit: Payer: Self-pay | Admitting: Internal Medicine

## 2012-07-12 DIAGNOSIS — R7611 Nonspecific reaction to tuberculin skin test without active tuberculosis: Secondary | ICD-10-CM

## 2012-07-15 NOTE — Progress Notes (Signed)
Quick Note:  CALLED AND LEFT MESSAGE FOR PT TO PLEASE MAKE APPT. ______

## 2012-07-17 ENCOUNTER — Ambulatory Visit: Payer: Self-pay | Admitting: Family Medicine

## 2012-07-17 ENCOUNTER — Ambulatory Visit (INDEPENDENT_AMBULATORY_CARE_PROVIDER_SITE_OTHER): Payer: Self-pay | Admitting: Family Medicine

## 2012-07-17 DIAGNOSIS — R7611 Nonspecific reaction to tuberculin skin test without active tuberculosis: Secondary | ICD-10-CM

## 2012-07-17 HISTORY — DX: Nonspecific reaction to tuberculin skin test without active tuberculosis: R76.11

## 2012-07-17 NOTE — Progress Notes (Signed)
  Subjective:    Patient ID: Austin Kramer, male    DOB: [birth year], 52 y.o.   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 It was positive and followup chest x-ray was negative. He has had no difficulty with cough, congestion, fever or night sweats.   Review of Systems     Objective:   Physical Exam And in no distress otherwise not examined       Assessment & Plan:  PPD positive I explained that since his x-ray was negative and he is having no active TB-type symptoms, no therapy is needed at this time. Recommend no further TB skin testing and routine yearly chest x-ray.

## 2012-07-23 ENCOUNTER — Ambulatory Visit (INDEPENDENT_AMBULATORY_CARE_PROVIDER_SITE_OTHER): Payer: Self-pay | Admitting: Family Medicine

## 2012-07-23 ENCOUNTER — Encounter: Payer: Self-pay | Admitting: Family Medicine

## 2012-07-23 VITALS — BP 150/100 | HR 100 | Ht 71.5 in | Wt 297.0 lb

## 2012-07-23 DIAGNOSIS — E119 Type 2 diabetes mellitus without complications: Secondary | ICD-10-CM

## 2012-07-23 DIAGNOSIS — E785 Hyperlipidemia, unspecified: Secondary | ICD-10-CM

## 2012-07-23 DIAGNOSIS — I10 Essential (primary) hypertension: Secondary | ICD-10-CM

## 2012-07-23 DIAGNOSIS — E669 Obesity, unspecified: Secondary | ICD-10-CM

## 2012-07-23 DIAGNOSIS — E1169 Type 2 diabetes mellitus with other specified complication: Secondary | ICD-10-CM

## 2012-07-23 DIAGNOSIS — E1159 Type 2 diabetes mellitus with other circulatory complications: Secondary | ICD-10-CM

## 2012-07-23 DIAGNOSIS — R7611 Nonspecific reaction to tuberculin skin test without active tuberculosis: Secondary | ICD-10-CM

## 2012-07-23 NOTE — Progress Notes (Signed)
  Subjective:    Patient ID: Austin Kramer, male    DOB: [birth year], 52 y.o.   MRN: [id number]  HPI He is here to have a form filled out to help his wife. 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 He does have underlying diabetes. He recently had a positive PPD however the chest x-ray is negative. He continues on medications listed in the chart. He does see his endocrinologist fairly regularly. He has no particular concerns or complaints.   Review of Systems     Objective:   Physical Exam alert and in no distress. Tympanic membranes and canals are normal. Throat is clear. Tonsils are normal. Neck is supple without adenopathy or thyromegaly. Cardiac exam shows a regular sinus rhythm without murmurs or gallops. Lungs are clear to auscultation.        Assessment & Plan:  Diabetes mellitus  PPD positive  Hypertension associated with diabetes  Hyperlipidemia LDL goal <70  Obesity (BMI 30-39.9) I did recommend that he talk to his endocrinologist concerning his elevated blood pressure. He is about to run out of his present medications.

## 2012-08-09 ENCOUNTER — Encounter: Payer: Self-pay | Admitting: Family Medicine

## 2012-10-18 ENCOUNTER — Telehealth: Payer: Self-pay | Admitting: Family Medicine

## 2012-10-18 NOTE — Telephone Encounter (Signed)
PUT 1 VIAL OF LEVEMIR AND 2 VIAL OF HUMALOG ON BACK STEPS FOR HIM LEFT MESSAGE ON PT CELL

## 2012-12-10 ENCOUNTER — Encounter: Payer: Self-pay | Admitting: Internal Medicine

## 2012-12-16 ENCOUNTER — Encounter (HOSPITAL_COMMUNITY): Payer: Self-pay | Admitting: Pharmacist

## 2012-12-16 ENCOUNTER — Encounter (HOSPITAL_COMMUNITY): Payer: Self-pay | Admitting: *Deleted

## 2012-12-16 ENCOUNTER — Other Ambulatory Visit: Payer: Self-pay | Admitting: Ophthalmology

## 2012-12-16 ENCOUNTER — Other Ambulatory Visit (HOSPITAL_COMMUNITY): Payer: Self-pay | Admitting: *Deleted

## 2012-12-16 MED ORDER — PHENYLEPHRINE HCL 2.5 % OP SOLN
1.0000 [drp] | Freq: Once | OPHTHALMIC | Status: AC
  Administered 2012-12-17 (×3): 1 [drp] via OPHTHALMIC

## 2012-12-16 MED ORDER — GATIFLOXACIN 0.5 % OP SOLN
1.0000 [drp] | Freq: Four times a day (QID) | OPHTHALMIC | Status: AC
  Administered 2012-12-17 (×3): 1 [drp] via OPHTHALMIC

## 2012-12-16 MED ORDER — TROPICAMIDE 1 % OP SOLN
1.0000 [drp] | OPHTHALMIC | Status: AC | PRN
  Administered 2012-12-17 (×3): 1 [drp] via OPHTHALMIC

## 2012-12-16 MED ORDER — CYCLOPENTOLATE HCL 1 % OP SOLN
1.0000 [drp] | OPHTHALMIC | Status: AC | PRN
  Administered 2012-12-17 (×3): 1 [drp] via OPHTHALMIC

## 2012-12-17 ENCOUNTER — Encounter: Admission: RE | Payer: Self-pay | Source: Ambulatory Visit

## 2012-12-17 ENCOUNTER — Ambulatory Visit (HOSPITAL_COMMUNITY)
Admission: RE | Admit: 2012-12-17 | Discharge: 2012-12-17 | Disposition: A | Discharge status: Home / Self Care | Payer: PRIVATE HEALTH INSURANCE | Source: Ambulatory Visit | Attending: Ophthalmology | Admitting: Ophthalmology

## 2012-12-17 ENCOUNTER — Encounter (HOSPITAL_COMMUNITY): Payer: Self-pay | Admitting: *Deleted

## 2012-12-17 ENCOUNTER — Encounter (HOSPITAL_COMMUNITY): Payer: Self-pay | Admitting: Certified Registered"

## 2012-12-17 ENCOUNTER — Encounter (HOSPITAL_COMMUNITY)
Admission: RE | Disposition: A | Discharge status: Home / Self Care | Payer: Self-pay | Source: Ambulatory Visit | Attending: Ophthalmology

## 2012-12-17 ENCOUNTER — Ambulatory Visit (HOSPITAL_COMMUNITY): Payer: PRIVATE HEALTH INSURANCE | Admitting: Certified Registered"

## 2012-12-17 ENCOUNTER — Encounter: Payer: Self-pay | Admitting: Family Medicine

## 2012-12-17 ENCOUNTER — Ambulatory Visit: Admission: RE | Admit: 2012-12-17 | Payer: Self-pay | Source: Ambulatory Visit | Admitting: Ophthalmology

## 2012-12-17 DIAGNOSIS — I10 Essential (primary) hypertension: Secondary | ICD-10-CM | POA: Insufficient documentation

## 2012-12-17 DIAGNOSIS — E1139 Type 2 diabetes mellitus with other diabetic ophthalmic complication: Secondary | ICD-10-CM | POA: Insufficient documentation

## 2012-12-17 DIAGNOSIS — Z794 Long term (current) use of insulin: Secondary | ICD-10-CM | POA: Insufficient documentation

## 2012-12-17 DIAGNOSIS — H4312 Vitreous hemorrhage, left eye: Secondary | ICD-10-CM

## 2012-12-17 DIAGNOSIS — E11359 Type 2 diabetes mellitus with proliferative diabetic retinopathy without macular edema: Secondary | ICD-10-CM | POA: Insufficient documentation

## 2012-12-17 DIAGNOSIS — K219 Gastro-esophageal reflux disease without esophagitis: Secondary | ICD-10-CM | POA: Insufficient documentation

## 2012-12-17 DIAGNOSIS — E11319 Type 2 diabetes mellitus with unspecified diabetic retinopathy without macular edema: Secondary | ICD-10-CM

## 2012-12-17 DIAGNOSIS — H4310 Vitreous hemorrhage, unspecified eye: Secondary | ICD-10-CM | POA: Insufficient documentation

## 2012-12-17 HISTORY — DX: Gastro-esophageal reflux disease without esophagitis: K21.9

## 2012-12-17 HISTORY — PX: PARS PLANA VITRECTOMY: SHX2166

## 2012-12-17 HISTORY — DX: Type 2 diabetes mellitus with unspecified diabetic retinopathy without macular edema: E11.319

## 2012-12-17 HISTORY — PX: PHOTOCOAGULATION WITH LASER: SHX6027

## 2012-12-17 LAB — CBC
HCT: 37.9 % — ABNORMAL LOW (ref 39.0–52.0)
Hemoglobin: 12.6 g/dL — ABNORMAL LOW (ref 13.0–17.0)
MCH: 24.8 pg — ABNORMAL LOW (ref 26.0–34.0)
MCHC: 33.2 g/dL (ref 30.0–36.0)
MCV: 74.5 fL — ABNORMAL LOW (ref 78.0–100.0)
RDW: 15.5 % (ref 11.5–15.5)

## 2012-12-17 LAB — GLUCOSE, CAPILLARY
Glucose-Capillary: 50 mg/dL — ABNORMAL LOW (ref 70–99)
Glucose-Capillary: 104 mg/dL — ABNORMAL HIGH (ref 70–99)
Glucose-Capillary: 62 mg/dL — ABNORMAL LOW (ref 70–99)
Glucose-Capillary: 57 mg/dL — ABNORMAL LOW (ref 70–99)
Glucose-Capillary: 160 mg/dL — ABNORMAL HIGH (ref 70–99)

## 2012-12-17 LAB — BASIC METABOLIC PANEL
BUN: 22 mg/dL (ref 6–23)
CO2: 28 mEq/L (ref 19–32)
Calcium: 8.9 mg/dL (ref 8.4–10.5)
Chloride: 106 mEq/L (ref 96–112)
Creatinine, Ser: 1.94 mg/dL — ABNORMAL HIGH (ref 0.50–1.35)
GFR calc Af Amer: 44 mL/min — ABNORMAL LOW (ref >90)
Glucose, Bld: 66 mg/dL — ABNORMAL LOW (ref 70–99)
Potassium: 3.9 mEq/L (ref 3.5–5.1)

## 2012-12-17 SURGERY — PARS PLANA VITRECTOMY WITH 25 GAUGE
Anesthesia: LOCAL | Laterality: Left

## 2012-12-17 SURGERY — PARS PLANA VITRECTOMY WITH 25 GAUGE
Anesthesia: Monitor Anesthesia Care | Site: Eye | Laterality: Left | Wound class: Clean

## 2012-12-17 MED ORDER — FENTANYL CITRATE 0.05 MG/ML IJ SOLN
INTRAMUSCULAR | Status: DC | PRN
  Administered 2012-12-17: 50 ug via INTRAVENOUS

## 2012-12-17 MED ORDER — TETRACAINE HCL 0.5 % OP SOLN
OPHTHALMIC | Status: AC
  Filled 2012-12-17: qty 2

## 2012-12-17 MED ORDER — LIDOCAINE HCL (CARDIAC) 20 MG/ML IV SOLN
INTRAVENOUS | Status: DC | PRN
  Administered 2012-12-17: 50 mg via INTRAVENOUS

## 2012-12-17 MED ORDER — PROPOFOL 10 MG/ML IV BOLUS
INTRAVENOUS | Status: DC | PRN
  Administered 2012-12-17: 30 mg via INTRAVENOUS

## 2012-12-17 MED ORDER — LIDOCAINE HCL 2 % IJ SOLN
INTRAMUSCULAR | Status: DC | PRN
  Administered 2012-12-17: 10 mL

## 2012-12-17 MED ORDER — PROPOFOL INFUSION 10 MG/ML OPTIME
INTRAVENOUS | Status: DC | PRN
  Administered 2012-12-17: 100 ug/kg/min via INTRAVENOUS

## 2012-12-17 MED ORDER — TROPICAMIDE 1 % OP SOLN
OPHTHALMIC | Status: AC
  Filled 2012-12-17: qty 3

## 2012-12-17 MED ORDER — HYPROMELLOSE (GONIOSCOPIC) 2.5 % OP SOLN
OPHTHALMIC | Status: DC | PRN
  Administered 2012-12-17: 1 [drp] via OPHTHALMIC

## 2012-12-17 MED ORDER — EPINEPHRINE HCL 1 MG/ML IJ SOLN
INTRAMUSCULAR | Status: AC
  Filled 2012-12-17: qty 1

## 2012-12-17 MED ORDER — PHENYLEPHRINE HCL 2.5 % OP SOLN
OPHTHALMIC | Status: AC
  Administered 2012-12-17: 12:00:00
  Filled 2012-12-17: qty 2

## 2012-12-17 MED ORDER — GATIFLOXACIN 0.5 % OP SOLN
OPHTHALMIC | Status: AC
  Filled 2012-12-17: qty 2.5

## 2012-12-17 MED ORDER — BSS PLUS IO SOLN
INTRAOCULAR | Status: AC
  Filled 2012-12-17: qty 500

## 2012-12-17 MED ORDER — NA CHONDROIT SULF-NA HYALURON 40-30 MG/ML IO SOLN
INTRAOCULAR | Status: DC | PRN
  Administered 2012-12-17: 0.5 mL via INTRAOCULAR

## 2012-12-17 MED ORDER — GENTAMICIN SULFATE 40 MG/ML IJ SOLN
INTRAMUSCULAR | Status: AC
  Filled 2012-12-17: qty 2

## 2012-12-17 MED ORDER — LACTATED RINGERS IV SOLN
INTRAVENOUS | Status: DC | PRN
  Administered 2012-12-17: 13:00:00 via INTRAVENOUS

## 2012-12-17 MED ORDER — CYCLOPENTOLATE HCL 1 % OP SOLN
OPHTHALMIC | Status: AC
  Filled 2012-12-17: qty 2

## 2012-12-17 MED ORDER — EPINEPHRINE HCL 1 MG/ML IJ SOLN
INTRAOCULAR | Status: DC | PRN
  Administered 2012-12-17: 14:00:00

## 2012-12-17 MED ORDER — LIDOCAINE HCL 2 % IJ SOLN
INTRAMUSCULAR | Status: AC
  Filled 2012-12-17: qty 20

## 2012-12-17 MED ORDER — DEXAMETHASONE SODIUM PHOSPHATE 10 MG/ML IJ SOLN
INTRAMUSCULAR | Status: AC
  Filled 2012-12-17: qty 1

## 2012-12-17 MED ORDER — DEXAMETHASONE SODIUM PHOSPHATE 10 MG/ML IJ SOLN
INTRAMUSCULAR | Status: DC | PRN
  Administered 2012-12-17: 10 mg

## 2012-12-17 MED ORDER — HYPROMELLOSE (GONIOSCOPIC) 2.5 % OP SOLN
OPHTHALMIC | Status: AC
  Filled 2012-12-17: qty 15

## 2012-12-17 MED ORDER — MIDAZOLAM HCL 5 MG/5ML IJ SOLN
INTRAMUSCULAR | Status: DC | PRN
  Administered 2012-12-17: 2 mg via INTRAVENOUS

## 2012-12-17 MED ORDER — DEXTROSE 50 % IV SOLN
INTRAVENOUS | Status: AC
  Administered 2012-12-17: 25 mL
  Filled 2012-12-17: qty 50

## 2012-12-17 MED ORDER — 0.9 % SODIUM CHLORIDE (POUR BTL) OPTIME
TOPICAL | Status: DC | PRN
  Administered 2012-12-17: 1000 mL

## 2012-12-17 MED ORDER — SODIUM CHLORIDE 0.9 % IJ SOLN
INTRAMUSCULAR | Status: AC
  Filled 2012-12-17: qty 10

## 2012-12-17 MED ORDER — NA CHONDROIT SULF-NA HYALURON 40-30 MG/ML IO SOLN
INTRAOCULAR | Status: AC
  Filled 2012-12-17: qty 0.5

## 2012-12-17 MED ORDER — SODIUM HYALURONATE 10 MG/ML IO SOLN
INTRAOCULAR | Status: AC
  Filled 2012-12-17: qty 0.85

## 2012-12-17 MED ORDER — POLYMYXIN B SULFATE 500000 UNITS IJ SOLR
INTRAMUSCULAR | Status: AC
  Filled 2012-12-17: qty 1

## 2012-12-17 MED ORDER — DEXTROSE 50 % IV SOLN
25.0000 mL | Freq: Once | INTRAVENOUS | Status: AC
  Administered 2012-12-17: 25 mL via INTRAVENOUS

## 2012-12-17 MED ORDER — DEXTROSE 50 % IV SOLN
50.0000 mL | Freq: Once | INTRAVENOUS | Status: AC | PRN
  Administered 2012-12-17: 50 mL via INTRAVENOUS

## 2012-12-17 MED ORDER — DEXTROSE 50 % IV SOLN
INTRAVENOUS | Status: AC
  Filled 2012-12-17: qty 50

## 2012-12-17 MED ORDER — SODIUM HYALURONATE 10 MG/ML IO SOLN
INTRAOCULAR | Status: DC | PRN
  Administered 2012-12-17: 0.85 mL via INTRAOCULAR

## 2012-12-17 SURGICAL SUPPLY — 35 items
APPLICATOR COTTON TIP 6IN STRL (MISCELLANEOUS) ×2 IMPLANT
CANNULA VLV SOFT TIP 25GA (OPHTHALMIC) ×2 IMPLANT
COVER MAYO STAND STRL (DRAPES) ×2 IMPLANT
DRAPE INCISE 51X51 W/FILM STRL (DRAPES) ×2 IMPLANT
DRAPE OPHTHALMIC 77X100 STRL (CUSTOM PROCEDURE TRAY) ×2 IMPLANT
GLOVE SS BIOGEL STRL SZ 8 (GLOVE) ×1 IMPLANT
GLOVE SS BIOGEL STRL SZ 8.5 (GLOVE) ×1 IMPLANT
GLOVE SUPERSENSE BIOGEL SZ 8 (GLOVE) ×1
GLOVE SUPERSENSE BIOGEL SZ 8.5 (GLOVE) ×1
GLOVE SURG SS PI 6.5 STRL IVOR (GLOVE) ×2 IMPLANT
GOWN PREVENTION PLUS XLARGE (GOWN DISPOSABLE) ×2 IMPLANT
GOWN STRL NON-REIN LRG LVL3 (GOWN DISPOSABLE) ×2 IMPLANT
KIT BASIN OR (CUSTOM PROCEDURE TRAY) ×2 IMPLANT
KIT ROOM TURNOVER OR (KITS) ×2 IMPLANT
LENS BIOM SUPER VIEW SET DISP (OPHTHALMIC RELATED) ×2 IMPLANT
MASK EYE SHIELD (GAUZE/BANDAGES/DRESSINGS) ×2 IMPLANT
MICROPICK 25G (MISCELLANEOUS)
NEEDLE 25GX 5/8IN NON SAFETY (NEEDLE) ×2 IMPLANT
NEEDLE FILTER BLUNT 18X 1/2SAF (NEEDLE) ×1
NEEDLE FILTER BLUNT 18X1 1/2 (NEEDLE) ×1 IMPLANT
NEEDLE HYPO 25GX1X1/2 BEV (NEEDLE) ×2 IMPLANT
NS IRRIG 1000ML POUR BTL (IV SOLUTION) ×2 IMPLANT
PACK VITRECTOMY CUSTOM (CUSTOM PROCEDURE TRAY) ×2 IMPLANT
PAD ARMBOARD 7.5X6 YLW CONV (MISCELLANEOUS) ×2 IMPLANT
PAD EYE OVAL STERILE LF (GAUZE/BANDAGES/DRESSINGS) ×2 IMPLANT
PAK PIK VITRECTOMY CVS 25GA (OPHTHALMIC) ×2 IMPLANT
PICK MICROPICK 25G (MISCELLANEOUS) IMPLANT
PROBE LASER ILLUM FLEX CVD 25G (OPHTHALMIC) ×2 IMPLANT
STOCKINETTE IMPERVIOUS 9X36 MD (GAUZE/BANDAGES/DRESSINGS) ×4 IMPLANT
SYR TB 1ML LUER SLIP (SYRINGE) ×2 IMPLANT
TAPE SURG TRANSPORE 1 IN (GAUZE/BANDAGES/DRESSINGS) ×1 IMPLANT
TAPE SURGICAL TRANSPORE 1 IN (GAUZE/BANDAGES/DRESSINGS) ×1
TOWEL OR 17X26 10 PK STRL BLUE (TOWEL DISPOSABLE) ×2 IMPLANT
WATER STERILE IRR 1000ML POUR (IV SOLUTION) ×2 IMPLANT
WIPE INSTRUMENT VISIWIPE 73X73 (MISCELLANEOUS) ×2 IMPLANT

## 2012-12-17 NOTE — Progress Notes (Signed)
12/17/12 1128  OBSTRUCTIVE SLEEP APNEA  Have you ever been diagnosed with sleep apnea through a sleep study? No  Do you snore loudly (loud enough to be heard through closed doors)?  1  Do you often feel tired, fatigued, or sleepy during the daytime? 0  Has anyone observed you stop breathing during your sleep? 1  Do you have, or are you being treated for high blood pressure? 1  BMI more than 35 kg/m2? 1  Age over 52 years old? 1  Neck circumference greater than 40 cm/18 inches? 1  Gender: 1  Obstructive Sleep Apnea Score 7  Score 4 or greater  Results sent to PCP

## 2012-12-17 NOTE — Progress Notes (Addendum)
Hypoglycemic Event  CBG: 62  Treatment: D50 IV 25 mL  Symptoms: None  Follow-up CBG: Time:1323 CBG Result104  Possible Reasons for Event: Inadequate meal intake  Comments/MD notified: Dr Lonni Fix, Lisabeth Pick Ward  Remember to initiate Hypoglycemia Order Set & complete

## 2012-12-17 NOTE — Anesthesia Postprocedure Evaluation (Signed)
  Anesthesia Post-op Note  Patient: Austin Kramer  Procedure(s) Performed: Procedure(s): LEFT PARS PLANA VITRECTOMY WITH 25 GAUGE/ENDO LASER (Left) PHOTOCOAGULATION WITH LASER (Left)  Patient Location: PACU  Anesthesia Type: MAC  Level of Consciousness: awake and alert   Airway and Oxygen Therapy: Patient Spontanous Breathing  Post-op Pain: none  Post-op Assessment: Post-op Vital signs reviewed, Patient's Cardiovascular Status Stable and Respiratory Function Stable  Post-op Vital Signs: Reviewed  Filed Vitals:   12/17/12 1456  BP: 173/98  Pulse: 73  Temp:   Resp:     Complications: No apparent anesthesia complications

## 2012-12-17 NOTE — Brief Op Note (Signed)
12/17/2012  2:18 PM  PATIENT:  Austin Kramer  52 y.o. male  PRE-OPERATIVE DIAGNOSIS:  LEFT VITREOUS HEMMORHAGE,, proliferative diabetic retinopathy left eye.  POST-OPERATIVE DIAGNOSIS:  LEFT VITREOUS HEMMORHAGE, same  PROCEDURE:  Procedure(s): LEFT PARS PLANA VITRECTOMY WITH 25 GAUGE/ENDO LASER (Left) PHOTOCOAGULATION WITH LASER (Left), panretinal  SURGEON:  Surgeon(s) and Role:    * Hurman Horn, MD - Primary  PHYSICIAN ASSISTANT:   ASSISTANTS: none   ANESTHESIA:   local, MAC  EBL:  Total I/O In: 500 [I.V.:500] Out: -   BLOOD ADMINISTERED:none  DRAINS: none   LOCAL MEDICATIONS USED:  XYLOCAINE 1%, 10 CC  SPECIMEN:  No Specimen  DISPOSITION OF SPECIMEN:  N/A  COUNTS:  YES  TOURNIQUET:  * No tourniquets in log *  DICTATION: .Other Dictation: Dictation Number [id number]  PLAN OF CARE: Discharge to home after PACU  PATIENT DISPOSITION:  PACU - hemodynamically stable.   Delay start of Pharmacological VTE agent (>24hrs) due to surgical blood loss or risk of bleeding: yes

## 2012-12-17 NOTE — Progress Notes (Signed)
CBG 50 1/2 amp D50 given as ordered per Dr Ola Spurr.

## 2012-12-17 NOTE — Preoperative (Signed)
Beta Blockers   Reason not to administer Beta Blockers:Not Applicable 

## 2012-12-17 NOTE — Transfer of Care (Signed)
Immediate Anesthesia Transfer of Care Note  Patient: Austin Kramer  Procedure(s) Performed: Procedure(s): LEFT PARS PLANA VITRECTOMY WITH 25 GAUGE/ENDO LASER (Left) PHOTOCOAGULATION WITH LASER (Left)  Patient Location: PACU  Anesthesia Type:MAC  Level of Consciousness: awake, alert  and oriented  Airway & Oxygen Therapy: Patient connected to nasal cannula oxygen  Post-op Assessment: Report given to PACU RN  Post vital signs: stable  Complications: No apparent anesthesia complications

## 2012-12-17 NOTE — Anesthesia Procedure Notes (Signed)
Procedure Name: MAC Date/Time: 12/17/2012 1:47 PM Performed by: Maeola Harman Pre-anesthesia Checklist: Patient identified, Emergency Drugs available, Suction available, Patient being monitored and Timeout performed Patient Re-evaluated:Patient Re-evaluated prior to inductionOxygen Delivery Method: Nasal cannula Preoxygenation: Pre-oxygenation with 100% oxygen Intubation Type: IV induction Placement Confirmation: positive ETCO2 Comments: Easy induction with Propofol gtt and fentanyl.  Dr. Zadie Rhine placed retro bulbar block without incident.  Patent airway noted.  + ETCO2, Waldron Session, CRNA

## 2012-12-17 NOTE — H&P (Signed)
Austin Kramer is an 52 y.o. male.   Chief Complaint: VISION LOSS LEFT EYE. HPI:  DIABETIC VITREOUS HEMORRHAGE OS WITH IRIS RUBEOSIS, AND TREATMENT NAIVE.    Past Medical History  Diagnosis Date  . Diabetes mellitus   . ED (erectile dysfunction)   . Obesity   . Cancer     PROSTATE  . PPD positive, treated   . Hypertension     [name]  . Neuromuscular disorder     neuropathy, "tingling in feet"  . GERD (gastroesophageal reflux disease)     occasional    Past Surgical History  Procedure Laterality Date  . Vasectomy    . Prostatectomy  2011  . Knee cartilage surgery    . Amputation  01/26/2012    Procedure: AMPUTATION RAY;  Surgeon: Newt Minion, MD;  Location: [facility];  Service: Orthopedics;  Laterality: Right;  Right foot 2nd ray amputation  . Colonoscopy      Family History  Problem Relation Age of Onset  . Diabetes Mother   . Diabetes Father    Social History:  reports that he has never smoked. He has never used smokeless tobacco. He reports that he does not drink alcohol or use illicit drugs.  Allergies: No Known Allergies  Medications Prior to Admission  Medication Sig Dispense Refill  . amLODipine (NORVASC) 5 MG tablet Take 2 tablets (10 mg total) by mouth daily.  60 tablet  1  . ferrous sulfate 325 (65 FE) MG tablet Take 325 mg by mouth daily with breakfast.      . fish oil-omega-3 fatty acids 1000 MG capsule Take 1 g by mouth daily.      . insulin detemir (LEVEMIR) 100 UNIT/ML injection Inject 60 Units into the skin 2 (two) times daily.      . insulin lispro (HUMALOG) 100 UNIT/ML injection Inject 20-25 Units into the skin 2 (two) times daily. For low blood sugar as needed. Sliding scale      . Multiple Vitamin (MULTIVITAMIN WITH MINERALS) TABS Take 1 tablet by mouth daily.      . rosuvastatin (CRESTOR) 20 MG tablet Take 1 tablet (20 mg total) by mouth daily.  21 tablet  0  . amitriptyline (ELAVIL) 10 MG tablet Take 1 tablet (10 mg total) by mouth  at bedtime.  30 tablet  7  . HYDROcodone-acetaminophen (NORCO/VICODIN) 5-325 MG per tablet Take 1-2 tablets by mouth every 4 (four) hours as needed.  30 tablet  0    Results for orders placed during the hospital encounter of 12/17/12 (from the past 48 hour(s))  GLUCOSE, CAPILLARY     Status: None   Collection Time    12/17/12 10:54 AM      Result Value Range   Glucose-Capillary 76  70 - 99 mg/dL   Comment 1 Notify RN    BASIC METABOLIC PANEL     Status: Abnormal   Collection Time    12/17/12 11:18 AM      Result Value Range   Sodium 142  135 - 145 mEq/L   Potassium 3.9  3.5 - 5.1 mEq/L   Chloride 106  96 - 112 mEq/L   CO2 28  19 - 32 mEq/L   Glucose, Bld 66 (*) 70 - 99 mg/dL   BUN 22  6 - 23 mg/dL   Creatinine, Ser 1.94 (*) 0.50 - 1.35 mg/dL   Calcium 8.9  8.4 - 10.5 mg/dL   GFR calc non Af Wyvonnia Lora  38 (*) >90 mL/min   GFR calc Af Amer 44 (*) >90 mL/min   Comment: (NOTE)     The eGFR has been calculated using the CKD EPI equation.     This calculation has not been validated in all clinical situations.     eGFR's persistently <90 mL/min signify possible Chronic Kidney     Disease.  CBC     Status: Abnormal   Collection Time    12/17/12 11:18 AM      Result Value Range   WBC 9.7  4.0 - 10.5 K/uL   RBC 5.09  4.22 - 5.81 MIL/uL   Hemoglobin 12.6 (*) 13.0 - 17.0 g/dL   HCT 37.9 (*) 39.0 - 52.0 %   MCV 74.5 (*) 78.0 - 100.0 fL   MCH 24.8 (*) 26.0 - 34.0 pg   MCHC 33.2  30.0 - 36.0 g/dL   RDW 15.5  11.5 - 15.5 %   Platelets 338  150 - 400 K/uL  GLUCOSE, CAPILLARY     Status: Abnormal   Collection Time    12/17/12 12:12 PM      Result Value Range   Glucose-Capillary 50 (*) 70 - 99 mg/dL   Comment 1 Notify RN    GLUCOSE, CAPILLARY     Status: Abnormal   Collection Time    12/17/12  1:06 PM      Result Value Range   Glucose-Capillary 62 (*) 70 - 99 mg/dL  GLUCOSE, CAPILLARY     Status: Abnormal   Collection Time    12/17/12  1:22 PM      Result Value Range    Glucose-Capillary 104 (*) 70 - 99 mg/dL   No results found.  Review of Systems  Constitutional: Negative.   HENT: Negative.   Eyes: Positive for blurred vision.  Gastrointestinal: Negative.   Musculoskeletal: Negative.   Skin: Negative.   Neurological: Negative.   Endo/Heme/Allergies: Negative.   Psychiatric/Behavioral: Negative.     Blood pressure 173/111, pulse 91, temperature 97.1 F (36.2 C), temperature source Oral, resp. rate 18, SpO2 100.00%. Physical Exam  Vitals reviewed. Constitutional: He is oriented to person, place, and time. Vital signs are normal. He appears well-developed and well-nourished.  HENT:  Head: Normocephalic and atraumatic.  Eyes: Conjunctivae and EOM are normal.  Fundoscopic exam:      The left eye shows hemorrhage. The left eye shows red reflex.    VISION OD  20/25  OS  4/200  Neck: Trachea normal and normal range of motion.  Cardiovascular: Normal rate and regular rhythm.   Respiratory: Effort normal.  GI: Normal appearance.  Musculoskeletal: Normal range of motion.  Neurological: He is alert and oriented to person, place, and time. He has normal reflexes.  Skin: Skin is warm, dry and intact.  Psychiatric: He has a normal mood and affect. His behavior is normal. Judgment and thought content normal. Cognition and memory are normal.     Assessment/Plan DIABETIC VITREOUS HEMORRHAGE LEFT EYE WITH IRIS RUBEOSIS.   PLAN IS VITRECTOMY ENDOLASER OS UNDER LOCAL MAC.  Casmere Hollenbeck A 12/17/2012, 1:25 PM

## 2012-12-17 NOTE — Anesthesia Preprocedure Evaluation (Addendum)
Anesthesia Evaluation  Patient identified by MRN, date of birth, ID band Patient awake    Reviewed: Allergy & Precautions, H&P , NPO status , Patient's Chart, lab work & pertinent test results  Airway Mallampati: I TM Distance: >3 FB     Dental no notable dental hx. (+) Teeth Intact, Dental Advisory Given and Loose   Pulmonary neg pulmonary ROS,  breath sounds clear to auscultation  Pulmonary exam normal       Cardiovascular hypertension, Pt. on medications Rhythm:Regular     Neuro/Psych  Neuromuscular disease negative neurological ROS  negative psych ROS   GI/Hepatic negative GI ROS, Neg liver ROS, GERD-  Medicated,  Endo/Other  diabetes, Well Controlled, Type 1, Insulin DependentMorbid obesity  Renal/GU Renal diseasenegative Renal ROS  negative genitourinary   Musculoskeletal   Abdominal (+)  Abdomen: soft. Bowel sounds: normal.  Peds  Hematology negative hematology ROS (+)   Anesthesia Other Findings   Reproductive/Obstetrics negative OB ROS                        Anesthesia Physical Anesthesia Plan  ASA: III  Anesthesia Plan: MAC   Post-op Pain Management:    Induction: Intravenous  Airway Management Planned: Simple Face Mask  Additional Equipment:   Intra-op Plan:   Post-operative Plan:   Informed Consent: I have reviewed the patients History and Physical, chart, labs and discussed the procedure including the risks, benefits and alternatives for the proposed anesthesia with the patient or authorized representative who has indicated his/her understanding and acceptance.   Dental advisory given  Plan Discussed with: CRNA  Anesthesia Plan Comments:        Anesthesia Quick Evaluation

## 2012-12-18 NOTE — Op Note (Signed)
NAMEJAVEYON, VANDER             ACCOUNT NO.:  [id number]  MEDICAL RECORD NO.:  [id number]  LOCATION:  [facility]                         FACILITY:  [facility]  PHYSICIAN:  Clent Demark. Atlantis Delong, M.D.   DATE OF BIRTH:  [birth year]  DATE OF PROCEDURE:  12/17/2012 DATE OF DISCHARGE:  12/17/2012                              OPERATIVE REPORT   PREOPERATIVE DIAGNOSES: 1. Dense vitreous hemorrhage, left eye. 2. Proliferative diabetic retinopathy, left eye and treatment, i.e. of     left eye. 3. Iris rubeosis.  POSTOPERATIVE DIAGNOSES: 1. Dense vitreous hemorrhage, left eye. 2. Proliferative diabetic retinopathy, left eye and treatment, i.e. of     left eye. 3. Iris rubeosis.  PROCEDURE:  Posterior vitrectomy with a panretinal endolaser photocoagulation, left eye -- 25 gauge.  SURGEON:  Clent Demark. Renlee Floor, M.D.  ANESTHESIA:  Local retrobulbar with monitored anesthesia control.  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  The patient was __________ vitreous opacification urgently so as to allow delivery of definitive panretinal photocoagulation, left eye.  He understands the risks of anesthesia, including the occurrence of death, loss of the eye from the underlying condition including but not limited to hemorrhage, infection, scarring, need of surgery, change in vision, loss of vision, and progressive disease, despite intervention.  Appropriate signed consent was obtained.  DESCRIPTION OF PROCEDURE:  The patient was taken to the operating room. In the operating room, appropriate monitors were placed followed by mild sedation.  Appropriate site selection was confirmed by the operating staff as left eye, thereafter mild sedation 2% Xylocaine 5 mL  injected retrobulbar, additional 5 mL modified van Lint.  Left periocular region was prepped and draped using usual ophthalmic fashion.  Lid speculum applied.  A 25-gauge trocar placed in the inferotemporal quadrant, placement verified visually and the infusion turned on.  Superior trocars applied.  __________ was begun and care was taken to avoid native lens.  Posterior hyaloid was removed off the optic nerve and macular region.  Preretinal vitreous hemorrhage was aspirated without difficulty.  Vitreous hemorrhage in the vitreous base inferiorly was aspirated and the media opacities were entirely clear.  No more findings of advanced peripheral non-perfusion outside the macula and posterior pole.  White lines of vascular nonperfusion were notable at 360 degrees.  Endolaser photocoagulation placed 360 degrees in a nearly confluent fashion because of the dire situation as patient had found his left eye to be in.  After completion of panretinal photocoagulation, hemostasis was spontaneous.  No complications occurred.  Lid speculum was removed from the eye.  Superior trocars removed from the eye and infusion was then removed.  Subconjunctival Decadron applied.  Sterile patch and Fox shield applied.  The patient tolerated the procedure without complications and taken to PACU.     Clent Demark Keyen Marban, M.D.     GAR/MEDQ  D:  12/17/2012  T:  12/18/2012  Job:  [id number]

## 2012-12-19 ENCOUNTER — Encounter (HOSPITAL_COMMUNITY): Payer: Self-pay | Admitting: Ophthalmology

## 2013-01-02 ENCOUNTER — Telehealth: Payer: Self-pay | Admitting: Internal Medicine

## 2013-01-02 ENCOUNTER — Other Ambulatory Visit: Payer: Self-pay

## 2013-01-02 MED ORDER — INSULIN LISPRO 100 UNIT/ML ~~LOC~~ SOLN: 20.0000 [IU] | Freq: Two times a day (BID) | SUBCUTANEOUS | Status: DC

## 2013-01-02 MED ORDER — ROSUVASTATIN CALCIUM 20 MG PO TABS: 20.0000 mg | ORAL_TABLET | Freq: Every day | ORAL | Status: DC

## 2013-01-02 MED ORDER — INSULIN DETEMIR 100 UNIT/ML ~~LOC~~ SOLN: 60.0000 [IU] | Freq: Two times a day (BID) | SUBCUTANEOUS | Status: DC

## 2013-01-02 NOTE — Telephone Encounter (Signed)
CALLED AND LEFT MESSAGE I HAVE PUT MED ON BACK STEPS

## 2013-01-02 NOTE — Telephone Encounter (Signed)
Patient is wanting samples of humalog,levemir, crestor.

## 2013-01-26 IMAGING — CR DG ANKLE COMPLETE 3+V*R*
1 series · 3 of 3 positions shown · non-contrast
Comparison: No priors.

CLINICAL DATA: Medial ankle pain.  Swelling around the entire
ankle.  Evaluate for osteomyelitis.

RIGHT ANKLE - COMPLETE 3+ VIEW

[Series 1: AP · U · 3 of 3 slices shown]
[im 1/3]
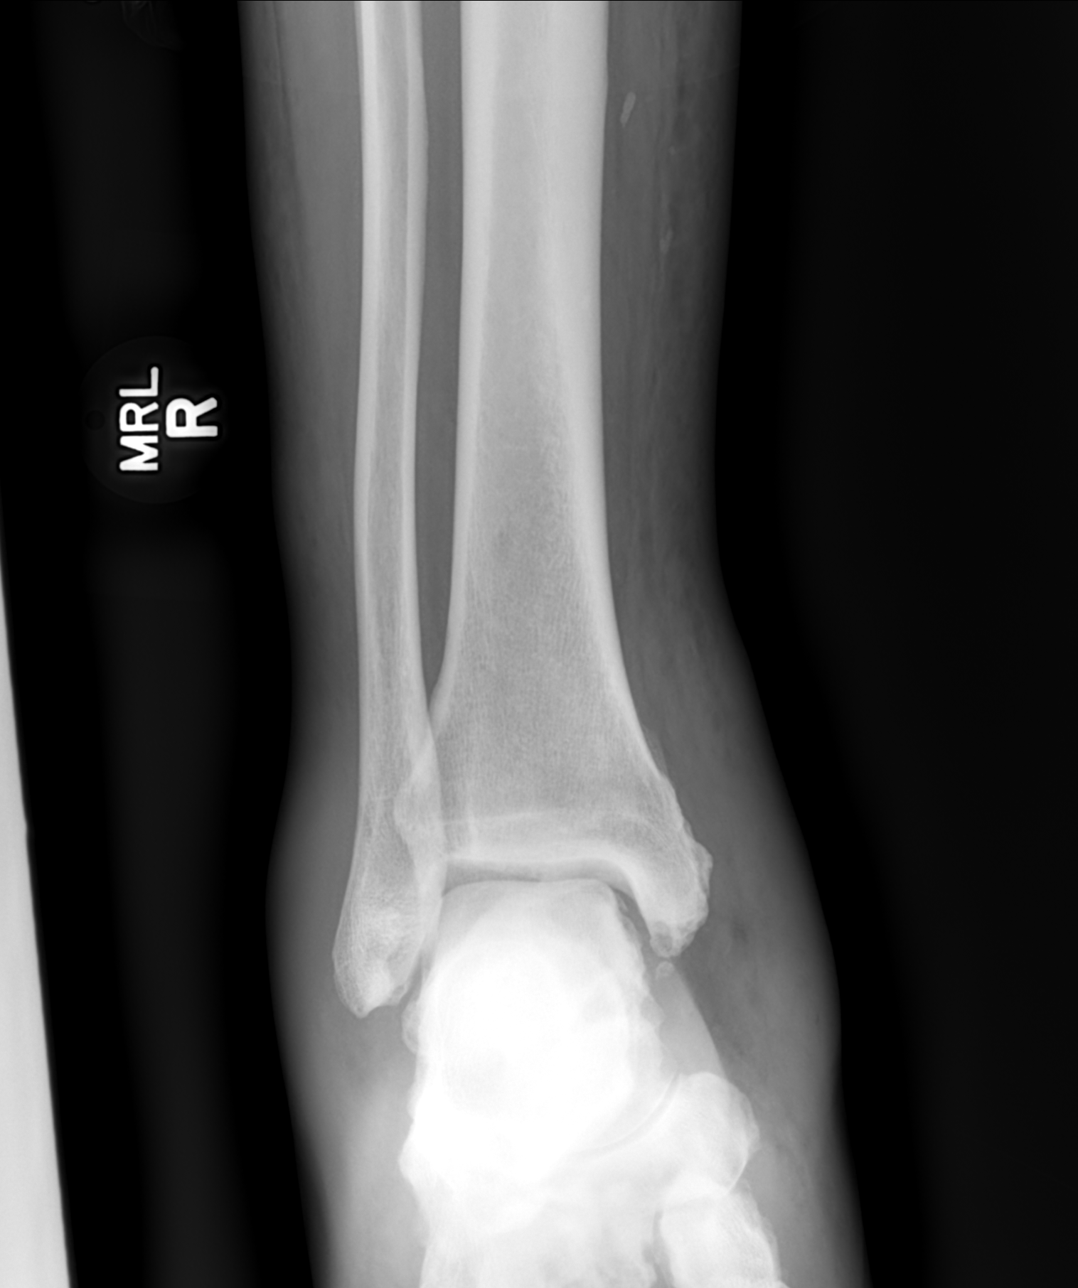
[im 2/3]
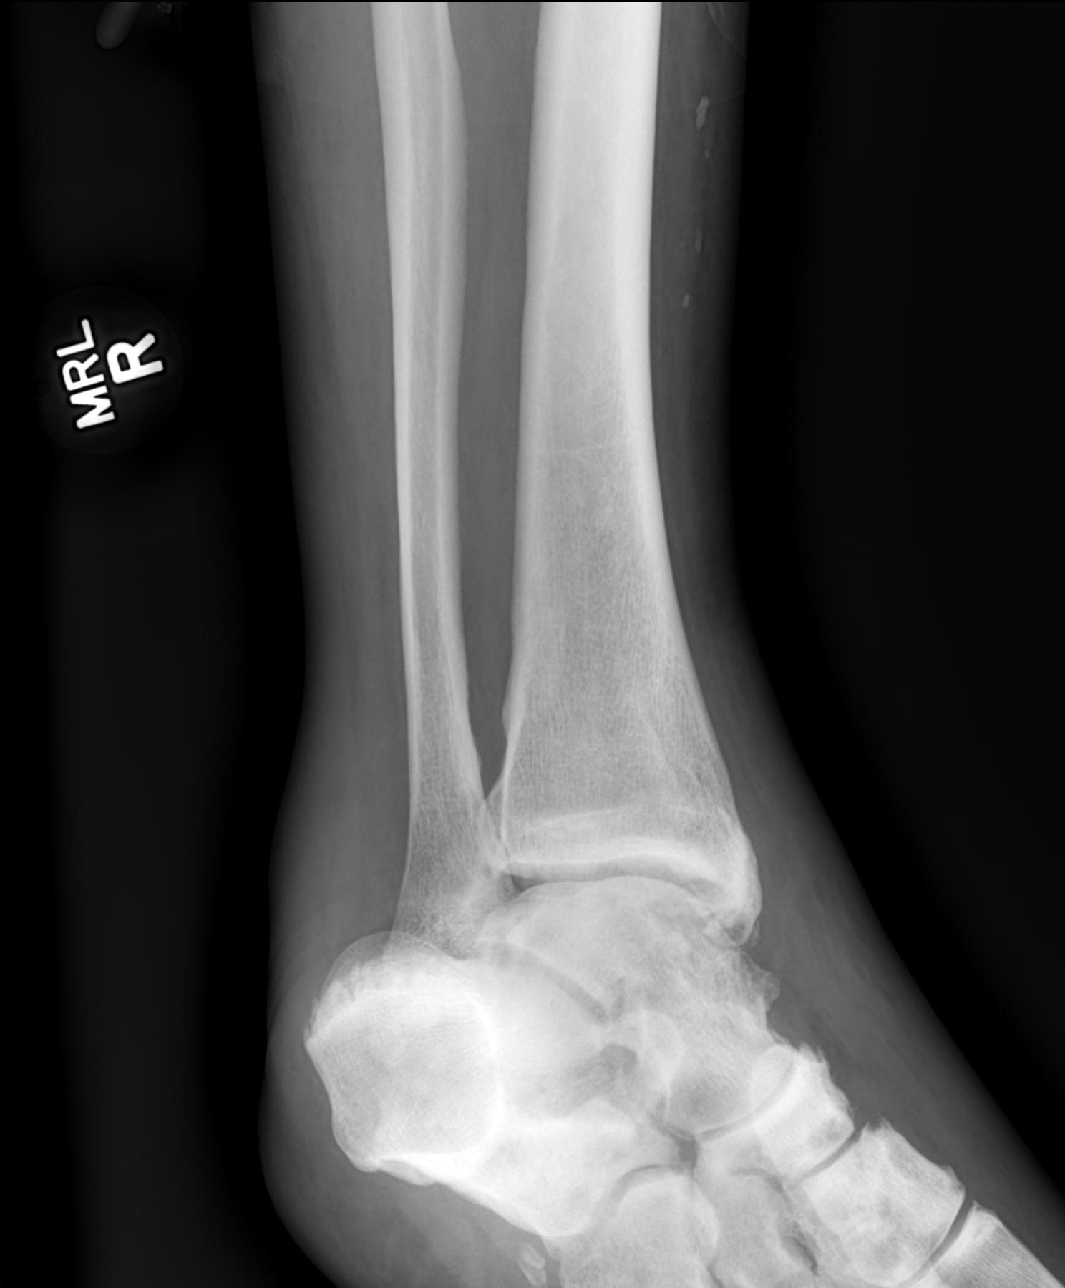
[im 3/3]
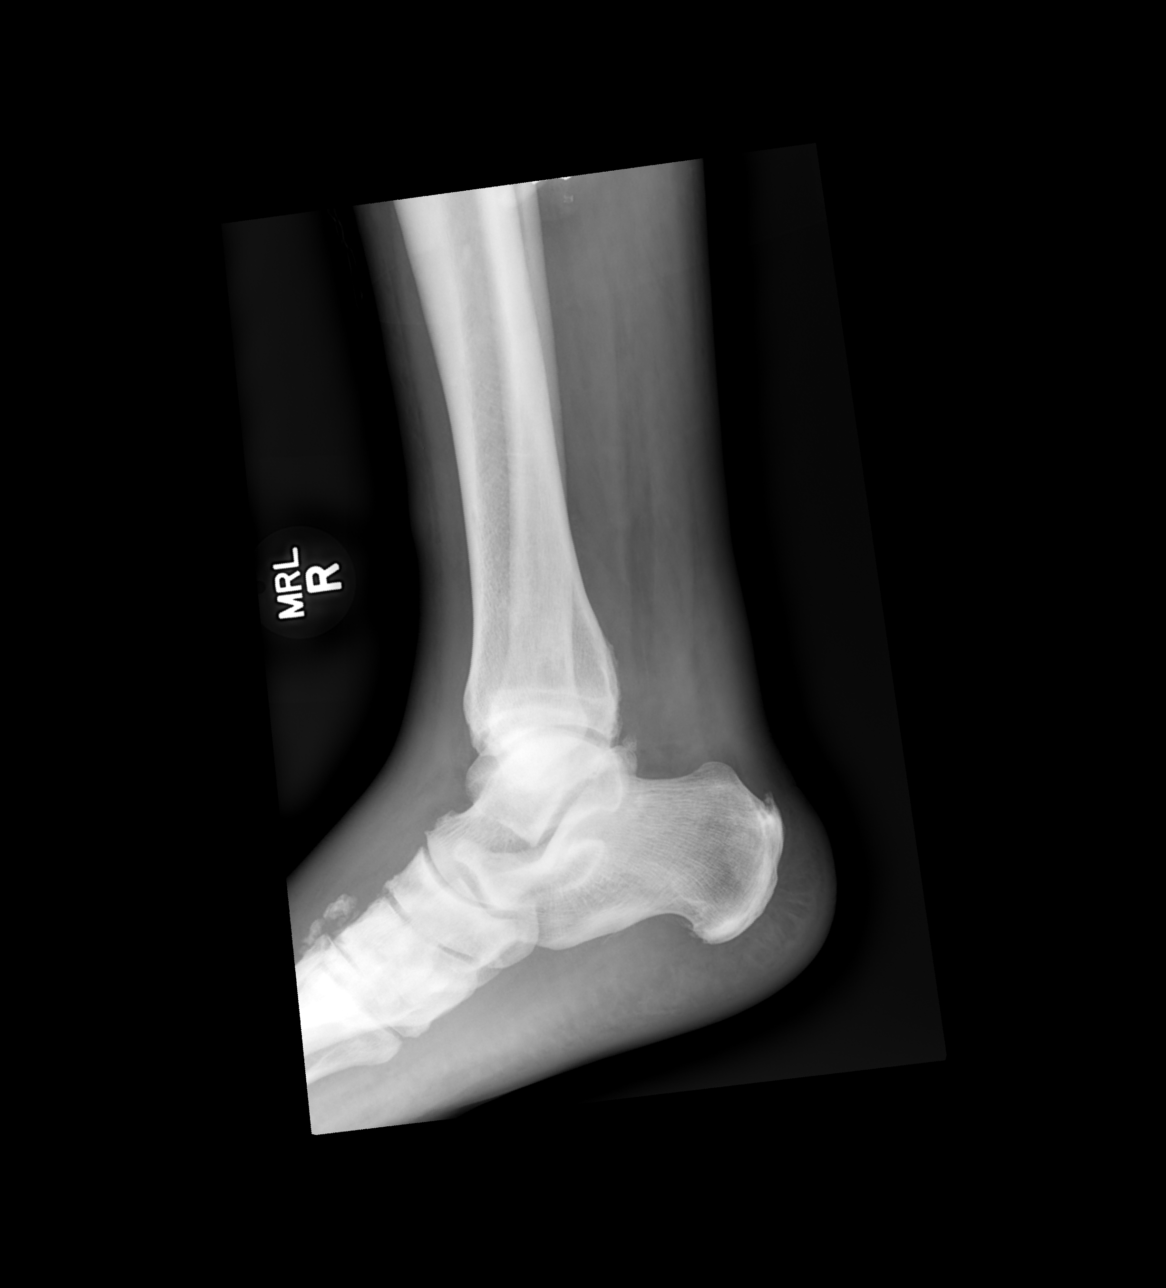

[3 of 3 positions shown; findings below may reference images not displayed]

FINDINGS: Extensive soft tissue swelling around the entire ankle is
noted, slightly asymmetric (medial greater than lateral).
Degenerative changes of osteoarthritis are noted in the tibiotalar
joint.  No acute displaced fracture, subluxation or dislocation.
No definite lytic bony lesions to strongly suggest presence of
osteomyelitis.  Scattered areas of heterotopic bone are noted, most
pronounced in the dorsal aspect of the foot adjacent to the midfoot
articulation with the metatarsals.  Pes planus.
IMPRESSION: 1.  No definite signs to suggest osteomyelitis.  There is extensive
soft tissue swelling around the ankle joint, as above.

## 2013-04-14 ENCOUNTER — Telehealth: Payer: Self-pay

## 2013-04-14 NOTE — Telephone Encounter (Signed)
Pt is coming in tomorrow to pick up samples of Crestor. Pt also asked for samples of Humalog and Levimir to but since he has not been here in a while he did not want to set up an appointment for the insulin, he stated he would go see the "diabetic lady" and get samples from her.

## 2013-04-24 ENCOUNTER — Telehealth: Payer: Self-pay | Admitting: Internal Medicine

## 2013-04-24 MED ORDER — INSULIN LISPRO 100 UNIT/ML ~~LOC~~ SOLN: 20.0000 [IU] | Freq: Two times a day (BID) | SUBCUTANEOUS | Status: DC

## 2013-04-24 MED ORDER — INSULIN DETEMIR 100 UNIT/ML ~~LOC~~ SOLN: 60.0000 [IU] | Freq: Two times a day (BID) | SUBCUTANEOUS | Status: DC

## 2013-04-24 NOTE — Telephone Encounter (Signed)
Pt will come by and pick up meds

## 2013-05-13 ENCOUNTER — Encounter: Payer: Self-pay | Admitting: Internal Medicine

## 2013-05-13 LAB — HM DIABETES EYE EXAM

## 2013-06-09 ENCOUNTER — Encounter (HOSPITAL_COMMUNITY): Payer: Self-pay | Admitting: Emergency Medicine

## 2013-06-09 DIAGNOSIS — K219 Gastro-esophageal reflux disease without esophagitis: Secondary | ICD-10-CM | POA: Diagnosis present

## 2013-06-09 DIAGNOSIS — E1129 Type 2 diabetes mellitus with other diabetic kidney complication: Secondary | ICD-10-CM | POA: Diagnosis present

## 2013-06-09 DIAGNOSIS — N342 Other urethritis: Principal | ICD-10-CM | POA: Diagnosis present

## 2013-06-09 DIAGNOSIS — Z794 Long term (current) use of insulin: Secondary | ICD-10-CM

## 2013-06-09 DIAGNOSIS — N179 Acute kidney failure, unspecified: Secondary | ICD-10-CM | POA: Diagnosis present

## 2013-06-09 DIAGNOSIS — Z9079 Acquired absence of other genital organ(s): Secondary | ICD-10-CM

## 2013-06-09 DIAGNOSIS — N2889 Other specified disorders of kidney and ureter: Secondary | ICD-10-CM | POA: Diagnosis present

## 2013-06-09 DIAGNOSIS — I129 Hypertensive chronic kidney disease with stage 1 through stage 4 chronic kidney disease, or unspecified chronic kidney disease: Secondary | ICD-10-CM | POA: Diagnosis present

## 2013-06-09 DIAGNOSIS — Z8546 Personal history of malignant neoplasm of prostate: Secondary | ICD-10-CM

## 2013-06-09 DIAGNOSIS — N183 Chronic kidney disease, stage 3 unspecified: Secondary | ICD-10-CM | POA: Diagnosis present

## 2013-06-09 DIAGNOSIS — E785 Hyperlipidemia, unspecified: Secondary | ICD-10-CM | POA: Diagnosis present

## 2013-06-09 DIAGNOSIS — Z6841 Body Mass Index (BMI) 40.0 and over, adult: Secondary | ICD-10-CM

## 2013-06-09 DIAGNOSIS — N058 Unspecified nephritic syndrome with other morphologic changes: Secondary | ICD-10-CM | POA: Diagnosis present

## 2013-06-09 DIAGNOSIS — Z833 Family history of diabetes mellitus: Secondary | ICD-10-CM

## 2013-06-09 DIAGNOSIS — S68118A Complete traumatic metacarpophalangeal amputation of other finger, initial encounter: Secondary | ICD-10-CM

## 2013-06-09 DIAGNOSIS — N3090 Cystitis, unspecified without hematuria: Secondary | ICD-10-CM | POA: Diagnosis present

## 2013-06-09 LAB — CBC WITH DIFFERENTIAL/PLATELET
Basophils Absolute: 0 10*3/uL (ref 0.0–0.1)
Basophils Relative: 0 % (ref 0–1)
EOS ABS: 0 10*3/uL (ref 0.0–0.7)
Eosinophils Relative: 0 % (ref 0–5)
HCT: 35.9 % — ABNORMAL LOW (ref 39.0–52.0)
Hemoglobin: 11.7 g/dL — ABNORMAL LOW (ref 13.0–17.0)
Lymphocytes Relative: 12 % (ref 12–46)
Lymphs Abs: 1.8 10*3/uL (ref 0.7–4.0)
MCH: 24.6 pg — ABNORMAL LOW (ref 26.0–34.0)
MCHC: 32.6 g/dL (ref 30.0–36.0)
MCV: 75.6 fL — ABNORMAL LOW (ref 78.0–100.0)
MONOS PCT: 6 % (ref 3–12)
Monocytes Absolute: 0.9 10*3/uL (ref 0.1–1.0)
Neutro Abs: 12 10*3/uL — ABNORMAL HIGH (ref 1.7–7.7)
Neutrophils Relative %: 82 % — ABNORMAL HIGH (ref 43–77)
PLATELETS: 333 10*3/uL (ref 150–400)
RBC: 4.75 MIL/uL (ref 4.22–5.81)
RDW: 15.9 % — ABNORMAL HIGH (ref 11.5–15.5)
WBC: 14.7 10*3/uL — ABNORMAL HIGH (ref 4.0–10.5)

## 2013-06-09 LAB — COMPREHENSIVE METABOLIC PANEL
ALT: 22 U/L (ref 0–53)
AST: 25 U/L (ref 0–37)
Albumin: 2.2 g/dL — ABNORMAL LOW (ref 3.5–5.2)
Alkaline Phosphatase: 91 U/L (ref 39–117)
BUN: 34 mg/dL — ABNORMAL HIGH (ref 6–23)
CALCIUM: 8.6 mg/dL (ref 8.4–10.5)
CO2: 25 mEq/L (ref 19–32)
CREATININE: 2.76 mg/dL — AB (ref 0.50–1.35)
Chloride: 100 mEq/L (ref 96–112)
GFR calc Af Amer: 29 mL/min — ABNORMAL LOW (ref >90)
GFR calc non Af Amer: 25 mL/min — ABNORMAL LOW (ref >90)
Glucose, Bld: 198 mg/dL — ABNORMAL HIGH (ref 70–99)
Potassium: 4.6 mEq/L (ref 3.7–5.3)
SODIUM: 139 meq/L (ref 137–147)
TOTAL PROTEIN: 7 g/dL (ref 6.0–8.3)
Total Bilirubin: 0.3 mg/dL (ref 0.3–1.2)

## 2013-06-09 LAB — URINALYSIS, ROUTINE W REFLEX MICROSCOPIC
Bilirubin Urine: NEGATIVE
Glucose, UA: 250 mg/dL — AB
Ketones, ur: NEGATIVE mg/dL
NITRITE: NEGATIVE
SPECIFIC GRAVITY, URINE: 1.019 (ref 1.005–1.030)
Urobilinogen, UA: 0.2 mg/dL (ref 0.0–1.0)
pH: 6.5 (ref 5.0–8.0)

## 2013-06-09 LAB — URINE MICROSCOPIC-ADD ON

## 2013-06-09 LAB — LIPASE, BLOOD: LIPASE: 22 U/L (ref 11–59)

## 2013-06-09 MED ORDER — ONDANSETRON 4 MG PO TBDP
8.0000 mg | ORAL_TABLET | Freq: Once | ORAL | Status: AC
Start: 2013-06-09 — End: 2013-06-09
  Administered 2013-06-09: 8 mg via ORAL
  Filled 2013-06-09: qty 2

## 2013-06-09 NOTE — ED Notes (Signed)
Pt reports lower abdominal pain and vomiting x 3 since yesterday. Pain radiates to left flank. Pt denies dysuria, hematuria. Pt reports diarrhea since Saturday.  Pt states he needs to strain a little to have a BM starting today

## 2013-06-10 ENCOUNTER — Encounter (HOSPITAL_COMMUNITY): Payer: Self-pay

## 2013-06-10 ENCOUNTER — Inpatient Hospital Stay (HOSPITAL_COMMUNITY)
Admission: EM | Admit: 2013-06-10 | Discharge: 2013-06-12 | DRG: 690 | Disposition: A | Discharge status: Home / Self Care | Payer: No Typology Code available for payment source | Attending: Internal Medicine | Admitting: Internal Medicine

## 2013-06-10 ENCOUNTER — Emergency Department (HOSPITAL_COMMUNITY): Payer: No Typology Code available for payment source

## 2013-06-10 DIAGNOSIS — E11319 Type 2 diabetes mellitus with unspecified diabetic retinopathy without macular edema: Secondary | ICD-10-CM

## 2013-06-10 DIAGNOSIS — E876 Hypokalemia: Secondary | ICD-10-CM

## 2013-06-10 DIAGNOSIS — E1169 Type 2 diabetes mellitus with other specified complication: Secondary | ICD-10-CM

## 2013-06-10 DIAGNOSIS — L0390 Cellulitis, unspecified: Secondary | ICD-10-CM

## 2013-06-10 DIAGNOSIS — N19 Unspecified kidney failure: Secondary | ICD-10-CM

## 2013-06-10 DIAGNOSIS — E669 Obesity, unspecified: Secondary | ICD-10-CM

## 2013-06-10 DIAGNOSIS — R7611 Nonspecific reaction to tuberculin skin test without active tuberculosis: Secondary | ICD-10-CM

## 2013-06-10 DIAGNOSIS — N185 Chronic kidney disease, stage 5: Secondary | ICD-10-CM

## 2013-06-10 DIAGNOSIS — N2889 Other specified disorders of kidney and ureter: Secondary | ICD-10-CM | POA: Diagnosis present

## 2013-06-10 DIAGNOSIS — I10 Essential (primary) hypertension: Secondary | ICD-10-CM

## 2013-06-10 DIAGNOSIS — E785 Hyperlipidemia, unspecified: Secondary | ICD-10-CM

## 2013-06-10 DIAGNOSIS — E119 Type 2 diabetes mellitus without complications: Secondary | ICD-10-CM

## 2013-06-10 DIAGNOSIS — N289 Disorder of kidney and ureter, unspecified: Secondary | ICD-10-CM

## 2013-06-10 DIAGNOSIS — E1159 Type 2 diabetes mellitus with other circulatory complications: Secondary | ICD-10-CM | POA: Diagnosis present

## 2013-06-10 DIAGNOSIS — Z794 Long term (current) use of insulin: Secondary | ICD-10-CM

## 2013-06-10 DIAGNOSIS — E1122 Type 2 diabetes mellitus with diabetic chronic kidney disease: Secondary | ICD-10-CM | POA: Diagnosis present

## 2013-06-10 DIAGNOSIS — E871 Hypo-osmolality and hyponatremia: Secondary | ICD-10-CM

## 2013-06-10 DIAGNOSIS — D72829 Elevated white blood cell count, unspecified: Secondary | ICD-10-CM

## 2013-06-10 HISTORY — DX: Unspecified kidney failure: N19

## 2013-06-10 HISTORY — DX: Other specified disorders of kidney and ureter: N28.89

## 2013-06-10 LAB — COMPREHENSIVE METABOLIC PANEL
ALBUMIN: 2 g/dL — AB (ref 3.5–5.2)
ALT: 20 U/L (ref 0–53)
AST: 20 U/L (ref 0–37)
Alkaline Phosphatase: 80 U/L (ref 39–117)
BUN: 32 mg/dL — ABNORMAL HIGH (ref 6–23)
CHLORIDE: 99 meq/L (ref 96–112)
CO2: 23 meq/L (ref 19–32)
CREATININE: 2.68 mg/dL — AB (ref 0.50–1.35)
Calcium: 8 mg/dL — ABNORMAL LOW (ref 8.4–10.5)
GFR calc Af Amer: 30 mL/min — ABNORMAL LOW (ref >90)
GFR, EST NON AFRICAN AMERICAN: 26 mL/min — AB (ref >90)
Glucose, Bld: 256 mg/dL — ABNORMAL HIGH (ref 70–99)
POTASSIUM: 4.2 meq/L (ref 3.7–5.3)
SODIUM: 135 meq/L — AB (ref 137–147)
Total Protein: 6.5 g/dL (ref 6.0–8.3)

## 2013-06-10 LAB — CBC WITH DIFFERENTIAL/PLATELET
Basophils Absolute: 0 10*3/uL (ref 0.0–0.1)
Basophils Relative: 0 % (ref 0–1)
EOS ABS: 0.1 10*3/uL (ref 0.0–0.7)
EOS PCT: 1 % (ref 0–5)
HCT: 33.6 % — ABNORMAL LOW (ref 39.0–52.0)
Hemoglobin: 10.6 g/dL — ABNORMAL LOW (ref 13.0–17.0)
LYMPHS ABS: 1.8 10*3/uL (ref 0.7–4.0)
Lymphocytes Relative: 19 % (ref 12–46)
MCH: 24 pg — AB (ref 26.0–34.0)
MCHC: 31.5 g/dL (ref 30.0–36.0)
MCV: 76.2 fL — ABNORMAL LOW (ref 78.0–100.0)
MONOS PCT: 8 % (ref 3–12)
Monocytes Absolute: 0.8 10*3/uL (ref 0.1–1.0)
Neutro Abs: 7.1 10*3/uL (ref 1.7–7.7)
Neutrophils Relative %: 72 % (ref 43–77)
PLATELETS: 325 10*3/uL (ref 150–400)
RBC: 4.41 MIL/uL (ref 4.22–5.81)
RDW: 16.2 % — ABNORMAL HIGH (ref 11.5–15.5)
WBC: 9.9 10*3/uL (ref 4.0–10.5)

## 2013-06-10 LAB — GLUCOSE, CAPILLARY
GLUCOSE-CAPILLARY: 273 mg/dL — AB (ref 70–99)
Glucose-Capillary: 291 mg/dL — ABNORMAL HIGH (ref 70–99)
Glucose-Capillary: 184 mg/dL — ABNORMAL HIGH (ref 70–99)

## 2013-06-10 MED ORDER — INSULIN DETEMIR 100 UNIT/ML ~~LOC~~ SOLN
60.0000 [IU] | Freq: Two times a day (BID) | SUBCUTANEOUS | Status: DC
  Administered 2013-06-10 (×2): 60 [IU] via SUBCUTANEOUS
  Filled 2013-06-10 (×4): qty 0.6

## 2013-06-10 MED ORDER — OMEGA-3 FATTY ACIDS 1000 MG PO CAPS: 1.0000 g | ORAL_CAPSULE | Freq: Every day | ORAL | Status: DC

## 2013-06-10 MED ORDER — OMEGA-3-ACID ETHYL ESTERS 1 G PO CAPS
1.0000 g | ORAL_CAPSULE | Freq: Every day | ORAL | Status: DC
  Administered 2013-06-10 – 2013-06-12 (×3): 1 g via ORAL
  Filled 2013-06-10 (×3): qty 1

## 2013-06-10 MED ORDER — ONDANSETRON HCL 4 MG PO TABS: 4.0000 mg | ORAL_TABLET | Freq: Four times a day (QID) | ORAL | Status: DC | PRN

## 2013-06-10 MED ORDER — AMLODIPINE BESYLATE 10 MG PO TABS
10.0000 mg | ORAL_TABLET | Freq: Every day | ORAL | Status: DC
  Administered 2013-06-10 – 2013-06-12 (×3): 10 mg via ORAL
  Filled 2013-06-10 (×4): qty 1

## 2013-06-10 MED ORDER — ATORVASTATIN CALCIUM 40 MG PO TABS
40.0000 mg | ORAL_TABLET | Freq: Every day | ORAL | Status: DC
  Administered 2013-06-10 – 2013-06-11 (×2): 40 mg via ORAL
  Filled 2013-06-10 (×3): qty 1

## 2013-06-10 MED ORDER — SODIUM CHLORIDE 0.9 % IV BOLUS (SEPSIS)
1000.0000 mL | Freq: Once | INTRAVENOUS | Status: AC
  Administered 2013-06-10: 1000 mL via INTRAVENOUS

## 2013-06-10 MED ORDER — ONDANSETRON HCL 4 MG/2ML IJ SOLN
4.0000 mg | Freq: Four times a day (QID) | INTRAMUSCULAR | Status: DC | PRN
Start: 2013-06-10 — End: 2013-06-12

## 2013-06-10 MED ORDER — FERROUS SULFATE 325 (65 FE) MG PO TABS
325.0000 mg | ORAL_TABLET | Freq: Every day | ORAL | Status: DC
  Administered 2013-06-10 – 2013-06-12 (×3): 325 mg via ORAL
  Filled 2013-06-10 (×4): qty 1

## 2013-06-10 MED ORDER — DEXTROSE 5 % IV SOLN
1.0000 g | Freq: Once | INTRAVENOUS | Status: AC
  Administered 2013-06-10: 1 g via INTRAVENOUS
  Filled 2013-06-10: qty 10

## 2013-06-10 MED ORDER — SODIUM CHLORIDE 0.9 % IV SOLN
INTRAVENOUS | Status: AC
  Administered 2013-06-10 – 2013-06-11 (×2): via INTRAVENOUS

## 2013-06-10 MED ORDER — MORPHINE SULFATE 4 MG/ML IJ SOLN
4.0000 mg | Freq: Once | INTRAMUSCULAR | Status: AC
  Administered 2013-06-10: 4 mg via INTRAVENOUS
  Filled 2013-06-10: qty 1

## 2013-06-10 MED ORDER — PNEUMOCOCCAL VAC POLYVALENT 25 MCG/0.5ML IJ INJ
0.5000 mL | INJECTION | INTRAMUSCULAR | Status: AC
  Administered 2013-06-11: 0.5 mL via INTRAMUSCULAR
  Filled 2013-06-10: qty 0.5

## 2013-06-10 MED ORDER — ONDANSETRON HCL 4 MG/2ML IJ SOLN: 4.0000 mg | Freq: Once | INTRAMUSCULAR | Status: DC

## 2013-06-10 MED ORDER — IOHEXOL 300 MG/ML  SOLN
100.0000 mL | Freq: Once | INTRAMUSCULAR | Status: AC | PRN
  Administered 2013-06-10: 100 mL via INTRAVENOUS

## 2013-06-10 MED ORDER — ACETAMINOPHEN 325 MG PO TABS
650.0000 mg | ORAL_TABLET | Freq: Four times a day (QID) | ORAL | Status: DC | PRN
Start: 2013-06-10 — End: 2013-06-12
  Administered 2013-06-11: 650 mg via ORAL
  Filled 2013-06-10: qty 2

## 2013-06-10 MED ORDER — DEXTROSE 5 % IV SOLN
1.0000 g | INTRAVENOUS | Status: DC
  Administered 2013-06-11 – 2013-06-12 (×2): 1 g via INTRAVENOUS
  Filled 2013-06-10 (×2): qty 10

## 2013-06-10 MED ORDER — IOHEXOL 300 MG/ML  SOLN
20.0000 mL | INTRAMUSCULAR | Status: DC
  Administered 2013-06-10: 25 mL via ORAL

## 2013-06-10 MED ORDER — INSULIN ASPART 100 UNIT/ML ~~LOC~~ SOLN
0.0000 [IU] | Freq: Three times a day (TID) | SUBCUTANEOUS | Status: DC
  Administered 2013-06-10 (×2): 5 [IU] via SUBCUTANEOUS
  Administered 2013-06-10: 2 [IU] via SUBCUTANEOUS

## 2013-06-10 MED ORDER — ENOXAPARIN SODIUM 40 MG/0.4ML ~~LOC~~ SOLN
40.0000 mg | SUBCUTANEOUS | Status: DC
  Administered 2013-06-10 – 2013-06-12 (×3): 40 mg via SUBCUTANEOUS
  Filled 2013-06-10 (×3): qty 0.4

## 2013-06-10 MED ORDER — ACETAMINOPHEN 650 MG RE SUPP: 650.0000 mg | Freq: Four times a day (QID) | RECTAL | Status: DC | PRN

## 2013-06-10 MED ORDER — SODIUM CHLORIDE 0.9 % IV SOLN
INTRAVENOUS | Status: DC
  Administered 2013-06-10: 06:00:00 via INTRAVENOUS

## 2013-06-10 NOTE — ED Provider Notes (Signed)
CSN: [id number]     Arrival date & time 06/09/13  2043 History   First MD Initiated Contact with Patient 06/10/13 0102     Chief Complaint  Patient presents with  . Abdominal Pain  . Emesis  . Diarrhea     (Consider location/radiation/quality/duration/timing/severity/associated sxs/prior Treatment) HPI Patient presents with left lower quadrant abdominal pain starting on Saturday and progressing throughout the weekend. Is associated with nausea and several bouts of nonbloody diarrhea on Sunday but none today. Patient denies dysuria, hematuria, frequency or urgency. He admits to chills but denies fevers. Past Medical History  Diagnosis Date  . Diabetes mellitus   . ED (erectile dysfunction)   . Obesity   . Cancer     PROSTATE  . PPD positive, treated   . Hypertension     [name]  . Neuromuscular disorder     neuropathy, "tingling in feet"  . GERD (gastroesophageal reflux disease)     occasional   Past Surgical History  Procedure Laterality Date  . Vasectomy    . Prostatectomy  2011  . Knee cartilage surgery    . Amputation  01/26/2012    Procedure: AMPUTATION RAY;  Surgeon: Newt Minion, MD;  Location: [place];  Service: Orthopedics;  Laterality: Right;  Right foot 2nd ray amputation  . Colonoscopy    . Pars plana vitrectomy Left 12/17/2012    Procedure: LEFT PARS PLANA VITRECTOMY WITH 25 GAUGE/ENDO LASER;  Surgeon: Hurman Horn, MD;  Location: [place];  Service: Ophthalmology;  Laterality: Left;  . Photocoagulation with laser Left 12/17/2012    Procedure: PHOTOCOAGULATION WITH LASER;  Surgeon: Hurman Horn, MD;  Location: [place];  Service: Ophthalmology;  Laterality: Left;   Family History  Problem Relation Age of Onset  . Diabetes Mother   . Diabetes Father    History  Substance Use Topics  . Smoking status: Never Smoker   . Smokeless tobacco: Never Used  . Alcohol Use: No    Review of Systems  Constitutional: Positive for chills and fatigue. Negative  for fever.  Respiratory: Negative for cough and shortness of breath.   Cardiovascular: Negative for chest pain.  Gastrointestinal: Positive for nausea, abdominal pain and diarrhea. Negative for vomiting, constipation and blood in stool.  Genitourinary: Positive for flank pain. Negative for dysuria, frequency, hematuria and difficulty urinating.  Musculoskeletal: Positive for back pain. Negative for myalgias, neck pain and neck stiffness.  Skin: Negative for rash and wound.  Neurological: Negative for dizziness, weakness, light-headedness, numbness and headaches.  All other systems reviewed and are negative.     Allergies  Review of patient's allergies indicates no known allergies.  Home Medications   Prior to Admission medications   Medication Sig Start Date End Date Taking? Authorizing Provider  amLODipine (NORVASC) 5 MG tablet Take 2 tablets (10 mg total) by mouth daily. 01/30/12  Yes Denita Lung, MD  ferrous sulfate 325 (65 FE) MG tablet Take 325 mg by mouth daily with breakfast.   Yes Historical Provider, MD  fish oil-omega-3 fatty acids 1000 MG capsule Take 1 g by mouth daily.   Yes Historical Provider, MD  insulin detemir (LEVEMIR) 100 UNIT/ML injection Inject 0.6 mLs (60 Units total) into the skin 2 (two) times daily. 04/24/13  Yes Denita Lung, MD  insulin lispro (HUMALOG) 100 UNIT/ML injection Inject 20-25 Units into the skin 2 (two) times daily. For low blood sugar as needed. Sliding scale 04/24/13  Yes Denita Lung,  MD  Multiple Vitamin (MULTIVITAMIN WITH MINERALS) TABS Take 1 tablet by mouth daily.   Yes Historical Provider, MD  rosuvastatin (CRESTOR) 20 MG tablet Take 1 tablet (20 mg total) by mouth daily. 01/02/13  Yes Denita Lung, MD   BP 154/96  Pulse 100  Temp(Src) 98.7 F (37.1 C) (Oral)  Resp 18  Ht 6' (1.829 m)  Wt 300 lb (136.079 kg)  BMI 40.68 kg/m2  SpO2 100% Physical Exam  Nursing note and vitals reviewed. Constitutional: He is oriented to  person, place, and time. He appears well-developed and well-nourished. No distress.  HENT:  Head: Normocephalic and atraumatic.  Mouth/Throat: Oropharynx is clear and moist.  Eyes: EOM are normal. Pupils are equal, round, and reactive to light.  Neck: Normal range of motion. Neck supple.  Cardiovascular: Normal rate and regular rhythm.   Pulmonary/Chest: Effort normal and breath sounds normal. No respiratory distress. He has no wheezes. He has no rales.  Abdominal: Soft. Bowel sounds are normal. He exhibits no distension and no mass. There is tenderness (tenderness to palpation in the left lower quadrant without rebound or guarding.). There is no rebound and no guarding.  Musculoskeletal: Normal range of motion. He exhibits tenderness (mild left flank tenderness to percussion). He exhibits no edema.  Neurological: He is alert and oriented to person, place, and time.  Moves all extremities without deficit. Sensation is grossly intact.  Skin: Skin is warm and dry. No rash noted. No erythema.  Psychiatric: He has a normal mood and affect. His behavior is normal.    ED Course  Procedures (including critical care time) Labs Review Labs Reviewed  CBC WITH DIFFERENTIAL - Abnormal; Notable for the following:    WBC 14.7 (*)    Hemoglobin 11.7 (*)    HCT 35.9 (*)    MCV 75.6 (*)    MCH 24.6 (*)    RDW 15.9 (*)    Neutrophils Relative % 82 (*)    Neutro Abs 12.0 (*)    All other components within normal limits  COMPREHENSIVE METABOLIC PANEL - Abnormal; Notable for the following:    Glucose, Bld 198 (*)    BUN 34 (*)    Creatinine, Ser 2.76 (*)    Albumin 2.2 (*)    GFR calc non Af Amer 25 (*)    GFR calc Af Amer 29 (*)    All other components within normal limits  URINALYSIS, ROUTINE W REFLEX MICROSCOPIC - Abnormal; Notable for the following:    APPearance CLOUDY (*)    Glucose, UA 250 (*)    Hgb urine dipstick MODERATE (*)    Protein, ur >300 (*)    Leukocytes, UA MODERATE (*)     All other components within normal limits  URINE MICROSCOPIC-ADD ON - Abnormal; Notable for the following:    Casts HYALINE CASTS (*)    All other components within normal limits  LIPASE, BLOOD    Imaging Review No results found.   EKG Interpretation None      MDM   Final diagnoses:  None   The CT scan of the abdomen was ordered without IV contrast to rule out diverticulitis however the patient was given IV contrast at the time of the procedure. Patient noted to have worsening renal failure above his baseline. He was given IV fluids in the emergency department and started on antibiotics for cystitis and bilateral urethritis. Discussed with [facility] hospitalist about admitting him for close monitoring of his renal function and treatment  of his urinary infection.    Julianne Rice, MD 06/11/13 [phone]

## 2013-06-10 NOTE — Progress Notes (Signed)
New Admission Note:  Arrival Method: via stretcher with Nurse Tech Mental Orientation: Alert &OX4 Telemetry: n/a Assessment: Completed Pain: denies any pain Safety Measures: Safety Fall Prevention Plan was given, discussed and signed. Admission: Completed [facility] [place] Orientation: Patient has been orientated to the room, unit and the staff. Family: None at bedside  Orders have been reviewed and implemented. Will continue to monitor the patient. Call light has been placed within reach.   Leandro Reasoner BSN, RN  Phone Number: [phone] [facility]

## 2013-06-10 NOTE — Progress Notes (Signed)
53yo male c/o abdominal pain and vomiting x3 since yesterday w/ pain radiating to left flank, to begin IV ABX.  Will start Rocephin 1g IV Q24H for UTI.  Wynona Neat, PharmD, BCPS 06/10/2013 6:44 AM

## 2013-06-10 NOTE — H&P (Signed)
[facility] History and Physical  Austin Kramer [id number] DOB: 02-09-1961 DOA: 06/10/2013  Referring physician: ER physician. PCP: Wyatt Haste, MD   Chief Complaint: Abdominal pain.  HPI: OSA [name] is a 53 y.o. male with history of chronic kidney disease baseline creatinine is around 1.7, diabetes mellitus presents to the ER because of lower abdominal pain. Patient has been having abdominal pain over last 2-3 days which acutely worsened last night. Patient has been having off and on nausea vomiting and diarrhea. In the ER patient had CT of pelvis which shows features concerning for cystitis and ureteritis. Patient denies any burning micturition or discharges. Has been having subjective feeling of chills denies any fever. Denies any chest pain or shortness of breath. Patient has been admitted for further management of his ureteritis.   Review of Systems: As presented in the history of presenting illness, rest negative.  Past Medical History  Diagnosis Date  . Diabetes mellitus   . ED (erectile dysfunction)   . Obesity   . Cancer     PROSTATE  . PPD positive, treated   . Hypertension     [name]  . Neuromuscular disorder     neuropathy, "tingling in feet"  . GERD (gastroesophageal reflux disease)     occasional   Past Surgical History  Procedure Laterality Date  . Vasectomy    . Prostatectomy  2011  . Knee cartilage surgery    . Amputation  01/26/2012    Procedure: AMPUTATION RAY;  Surgeon: Newt Minion, MD;  Location: [place];  Service: Orthopedics;  Laterality: Right;  Right foot 2nd ray amputation  . Colonoscopy    . Pars plana vitrectomy Left 12/17/2012    Procedure: LEFT PARS PLANA VITRECTOMY WITH 25 GAUGE/ENDO LASER;  Surgeon: Hurman Horn, MD;  Location: [place];  Service: Ophthalmology;  Laterality: Left;  . Photocoagulation with laser Left 12/17/2012    Procedure: PHOTOCOAGULATION WITH LASER;  Surgeon: Hurman Horn, MD;   Location: [place];  Service: Ophthalmology;  Laterality: Left;   Social History:  reports that he has never smoked. He has never used smokeless tobacco. He reports that he does not drink alcohol or use illicit drugs. Where does patient live home. Can patient participate in ADLs? Yes.  No Known Allergies  Family History:  Family History  Problem Relation Age of Onset  . Diabetes Mother   . Diabetes Father       Prior to Admission medications   Medication Sig Start Date End Date Taking? Authorizing Provider  amLODipine (NORVASC) 5 MG tablet Take 2 tablets (10 mg total) by mouth daily. 01/30/12  Yes Denita Lung, MD  ferrous sulfate 325 (65 FE) MG tablet Take 325 mg by mouth daily with breakfast.   Yes Historical Provider, MD  fish oil-omega-3 fatty acids 1000 MG capsule Take 1 g by mouth daily.   Yes Historical Provider, MD  insulin detemir (LEVEMIR) 100 UNIT/ML injection Inject 0.6 mLs (60 Units total) into the skin 2 (two) times daily. 04/24/13  Yes Denita Lung, MD  insulin lispro (HUMALOG) 100 UNIT/ML injection Inject 20-25 Units into the skin 2 (two) times daily. For low blood sugar as needed. Sliding scale 04/24/13  Yes Denita Lung, MD  Multiple Vitamin (MULTIVITAMIN WITH MINERALS) TABS Take 1 tablet by mouth daily.   Yes Historical Provider, MD  rosuvastatin (CRESTOR) 20 MG tablet Take 1 tablet (20 mg total) by mouth daily. 01/02/13  Yes [name]  Mardelle Matte, MD    Physical Exam: Filed Vitals:   06/09/13 2352 06/10/13 0302 06/10/13 0558 06/10/13 0623  BP: 154/96 162/87 200/116 155/82  Pulse: 100 74 94 90  Temp: 98.7 F (37.1 C) 98.4 F (36.9 C) 98.5 F (36.9 C)   TempSrc: Oral Oral Oral   Resp: 18 16 16    Height:   6' (1.829 m)   Weight:   135.535 kg (298 lb 12.8 oz)   SpO2: 100% 99% 96%      General:  Well-developed and nourished.  Eyes: Anicteric no pallor.  ENT: No discharge from the ears eyes nose mouth.  Neck: No mass felt.  Cardiovascular: S1-S2  heard.  Respiratory: No rhonchi or crepitations.  Abdomen: Soft nontender bowel sounds present no guarding or rigidity.  Skin: No rash.  Musculoskeletal: No edema.  Psychiatric: Appears normal.  Neurologic: Alert awake oriented to time place and person. Moves all extremities.  Labs on Admission:  Basic Metabolic Panel:  Recent Labs Lab 06/09/13 2113  NA 139  K 4.6  CL 100  CO2 25  GLUCOSE 198*  BUN 34*  CREATININE 2.76*  CALCIUM 8.6   Liver Function Tests:  Recent Labs Lab 06/09/13 2113  AST 25  ALT 22  ALKPHOS 91  BILITOT 0.3  PROT 7.0  ALBUMIN 2.2*    Recent Labs Lab 06/09/13 2113  LIPASE 22   No results found for this basename: AMMONIA,  in the last 168 hours CBC:  Recent Labs Lab 06/09/13 2113  WBC 14.7*  NEUTROABS 12.0*  HGB 11.7*  HCT 35.9*  MCV 75.6*  PLT 333   Cardiac Enzymes: No results found for this basename: CKTOTAL, CKMB, CKMBINDEX, TROPONINI,  in the last 168 hours  BNP (last 3 results) No results found for this basename: PROBNP,  in the last 8760 hours CBG: No results found for this basename: GLUCAP,  in the last 168 hours  Radiological Exams on Admission: Ct Abdomen Pelvis W Contrast  06/10/2013   CLINICAL DATA:  Lower abdominal pain and vomiting. Left flank pain. Diarrhea.  EXAM: CT ABDOMEN AND PELVIS WITH CONTRAST  TECHNIQUE: Multidetector CT imaging of the abdomen and pelvis was performed using the standard protocol following bolus administration of intravenous contrast.  CONTRAST:  135mL OMNIPAQUE IOHEXOL 300 MG/ML  SOLN  COMPARISON:  CT of the abdomen and pelvis performed 09/03/2009  FINDINGS: The visualized lung bases are clear.  The liver and spleen are unremarkable in appearance. The gallbladder is within normal limits. Minimal calcification adjacent to the pancreatic head appears to reflect a 1.2 cm partially calcified lymph node on coronal images. The pancreas is unremarkable. The adrenal glands are within normal limits.   There is diffuse soft tissue stranding along the course of both ureters, worse on the left, with bilateral ureteral wall thickening, worse on the left. This is most compatible with bilateral ureteritis. Mild nonspecific perinephric stranding is noted bilaterally; no definite pyelonephritis is seen. There is no evidence of hydronephrosis. No renal or ureteral stones are identified. A 0.7 cm cyst is noted at the interpole region of the right kidney.  No free fluid is identified. The small bowel is unremarkable in appearance. The stomach is within normal limits. No acute vascular abnormalities are seen. Minimal calcification is noted along the distal abdominal aorta.  The appendix is normal in caliber and contains air, without evidence for appendicitis. The colon is unremarkable in appearance.  The bladder is largely decompressed; there is diffuse bladder wall thickening  and irregularity, thought to reflect cystitis. The patient is status post prostatectomy. No inguinal lymphadenopathy is seen.  No acute osseous abnormalities are identified. A few subcortical cysts are noted at the right hip joint.  IMPRESSION: 1. Diffuse soft tissue stranding along the course of both ureters, with bilateral ureteral wall thickening, worse on the left. This is most compatible with bilateral ureteritis. No definite evidence of pyelonephritis. 2. Diffuse bladder wall thickening and irregularity, thought to reflect cystitis. Cystoscopy could be considered following completion of treatment for infection, to exclude an underlying mass. 3. Small right renal cyst seen. 4. Apparent 1.2 cm partially calcified lymph node adjacent to the pancreatic head; this may reflect remote granulomatous disease.   Electronically Signed   By: Garald Balding M.D.   On: 06/10/2013 04:11     Assessment/Plan Principal Problem:   Ureteritis Active Problems:   Diabetes mellitus   Hypertension associated with diabetes   Renal failure   1. Uretritis and  cystitis - follow urine cultures. Patient has been placed on ceftriaxone. 2. Acute on chronic renal failure - probably precipitated by patient's diarrhea and vomiting. Patient in addition has received contrast-enhanced CAT scan in the ER. For now I have placed patient on gentle hydration. Closely follow intake output and metabolic panel. Patient states his diarrhea has improved. 3. Diabetes mellitus type 2 - continue home medications with sliding-scale coverage. 4. Hypertension - continue amlodipine. 5. Hyperlipidemia - continue statins.    Code Status: Full code.  Family Communication: None.  Disposition Plan: Admit to inpatient.    [facility] Pager [phone].  If 7PM-7AM, please contact night-coverage [email or web address] Password [facility] 06/10/2013, 6:36 AM

## 2013-06-10 NOTE — Progress Notes (Addendum)
[facility] PROGRESS NOTE  Austin Kramer [id number] DOB: 02-01-1961 DOA: 06/10/2013 PCP: Wyatt Haste, MD  Brief narrative: 53 y.o. male with past medical history of daibetes, hypertension, CKD stage 3 who presented to [facility] ED 06/10/2013 with worsening abdominal pain especially in left flank, associated nausea and vomiting as well as diarrhea all started 1-2 days prior to this admission. On admission, patient's vitals were stable with BP of 154/96, HR 74 - 110 and T max 98.4 F. Oxygen saturation was 96% on room air. CT abdomen showed diffuse soft tissue stranding along the course of both ureters, with bilateral ureteral wall thickening, worse on the left which is most compatible with bilateral ureteritis. Findings also suspicious for cystitis. Patient was started on rocephin in ED.  Assessment and Plan:  Principal Problem:   Ureteritis, abdominal pain, nausea and vomiting - UA on admission showed moderate leukocytes and too numerous to count WBC. CT abdomen was significant for ureteritis and cystitis and pt started on IV rocephin daily - follow up urine culture results - continue supportive care with IV fluids, NS @ 100 cc/hr, continue antiemetics as needed Active Problems:   Diabetes mellitus - check A1c - continue Levemir 60 units BID - continue SSI   CKD, stage 3 - likely related to diabetes, diabetic nephropathy - creatinine is elevated at 2.76 on this admission which is above usual baseline values of 1.7 - continue IV fluids and follow up BMP in am   Hypertension - continue Norvasc    Dyslipidemia - continue statin therapy    Morbid obesity - diet education provided this am  Consultants:  None   Procedures/Studies: Ct Abdomen Pelvis W Contrast  06/10/2013   IMPRESSION: 1. Diffuse soft tissue stranding along the course of both ureters, with bilateral ureteral wall thickening, worse on the left. This is most compatible with bilateral ureteritis. No definite  evidence of pyelonephritis. 2. Diffuse bladder wall thickening and irregularity, thought to reflect cystitis. Cystoscopy could be considered following completion of treatment for infection, to exclude an underlying mass. 3. Small right renal cyst seen. 4. Apparent 1.2 cm partially calcified lymph node adjacent to the pancreatic head; this may reflect remote granulomatous disease.     Antibiotics:  Rocephin 06/10/2013 -->  Code Status: Full Family Communication: Pt at bedside Disposition Plan: Home when medically stable  HPI/Subjective: No events overnight.   Objective: Filed Vitals:   06/10/13 0302 06/10/13 0558 06/10/13 0623 06/10/13 0801  BP: 162/87 200/116 155/82 159/73  Pulse: 74 94 90 95  Temp: 98.4 F (36.9 C) 98.5 F (36.9 C)  98.4 F (36.9 C)  TempSrc: Oral Oral  Oral  Resp: 16 16  16   Height:  6' (1.829 m)    Weight:  135.535 kg (298 lb 12.8 oz)    SpO2: 99% 96%  97%    Intake/Output Summary (Last 24 hours) at 06/10/13 1043 Last data filed at 06/10/13 0700  Gross per 24 hour  Intake    600 ml  Output      0 ml  Net    600 ml    Exam:   General:  Pt is alert, follows commands appropriately, not in acute distress  Cardiovascular: Regular rate and rhythm, S1/S2, no murmurs, no rubs, no gallops  Respiratory: Clear to auscultation bilaterally, no wheezing, no crackles, no rhonchi  Abdomen: Soft, non tender, non distended, bowel sounds present, no guarding  Extremities: No edema, pulses DP and PT palpable bilaterally  Neuro: Grossly nonfocal  Data Reviewed: Basic Metabolic Panel:  Recent Labs Lab 06/09/13 2113 06/10/13 0835  NA 139 135*  K 4.6 4.2  CL 100 99  CO2 25 23  GLUCOSE 198* 256*  BUN 34* 32*  CREATININE 2.76* 2.68*  CALCIUM 8.6 8.0*   Liver Function Tests:  Recent Labs Lab 06/09/13 2113 06/10/13 0835  AST 25 20  ALT 22 20  ALKPHOS 91 80  BILITOT 0.3 <0.2*  PROT 7.0 6.5  ALBUMIN 2.2* 2.0*    Recent Labs Lab 06/09/13 2113   LIPASE 22   No results found for this basename: AMMONIA,  in the last 168 hours CBC:  Recent Labs Lab 06/09/13 2113 06/10/13 0835  WBC 14.7* 9.9  NEUTROABS 12.0* 7.1  HGB 11.7* 10.6*  HCT 35.9* 33.6*  MCV 75.6* 76.2*  PLT 333 325   Cardiac Enzymes: No results found for this basename: CKTOTAL, CKMB, CKMBINDEX, TROPONINI,  in the last 168 hours BNP: No components found with this basename: POCBNP,  CBG:  Recent Labs Lab 06/10/13 0753  GLUCAP 273*    No results found for this or any previous visit (from the past 240 hour(s)).   Scheduled Meds: . amLODipine  10 mg Oral Daily  . atorvastatin  40 mg Oral q1800  .  (ROCEPHIN)  IV  1 g Intravenous Q24H  . enoxaparin (LOVENOX)  40 mg Subcutaneous Q24H  . ferrous sulfate  325 mg Oral Q breakfast  . insulin aspart  0-9 Units Subcutaneous TID WC  . insulin detemir  60 Units Subcutaneous BID  . omega-3 acid ethyl   1 g Oral Daily   Continuous Infusions: . sodium chloride 100 mL/hr at 06/10/13 0807     Theodis Blaze, MD  [facility] Pager [phone]  If 7PM-7AM, please contact night-coverage [email or web address] Password [facility] 06/10/2013, 10:43 AM   LOS: 0 days

## 2013-06-11 DIAGNOSIS — N289 Disorder of kidney and ureter, unspecified: Secondary | ICD-10-CM

## 2013-06-11 LAB — GLUCOSE, CAPILLARY
GLUCOSE-CAPILLARY: 200 mg/dL — AB (ref 70–99)
GLUCOSE-CAPILLARY: 114 mg/dL — AB (ref 70–99)
GLUCOSE-CAPILLARY: 181 mg/dL — AB (ref 70–99)
Glucose-Capillary: 48 mg/dL — ABNORMAL LOW (ref 70–99)
Glucose-Capillary: 90 mg/dL (ref 70–99)
Glucose-Capillary: 106 mg/dL — ABNORMAL HIGH (ref 70–99)

## 2013-06-11 LAB — CBC
HCT: 32.4 % — ABNORMAL LOW (ref 39.0–52.0)
Hemoglobin: 10.4 g/dL — ABNORMAL LOW (ref 13.0–17.0)
MCH: 24.2 pg — AB (ref 26.0–34.0)
MCHC: 32.1 g/dL (ref 30.0–36.0)
MCV: 75.3 fL — ABNORMAL LOW (ref 78.0–100.0)
PLATELETS: 322 10*3/uL (ref 150–400)
RBC: 4.3 MIL/uL (ref 4.22–5.81)
RDW: 15.9 % — ABNORMAL HIGH (ref 11.5–15.5)
WBC: 7.2 10*3/uL (ref 4.0–10.5)

## 2013-06-11 LAB — BASIC METABOLIC PANEL
BUN: 34 mg/dL — ABNORMAL HIGH (ref 6–23)
CALCIUM: 8.4 mg/dL (ref 8.4–10.5)
CO2: 25 mEq/L (ref 19–32)
Chloride: 107 mEq/L (ref 96–112)
Creatinine, Ser: 2.68 mg/dL — ABNORMAL HIGH (ref 0.50–1.35)
GFR calc Af Amer: 30 mL/min — ABNORMAL LOW (ref >90)
GFR, EST NON AFRICAN AMERICAN: 26 mL/min — AB (ref >90)
GLUCOSE: 81 mg/dL (ref 70–99)
Potassium: 4.1 mEq/L (ref 3.7–5.3)
Sodium: 144 mEq/L (ref 137–147)

## 2013-06-11 LAB — HEMOGLOBIN A1C
Hgb A1c MFr Bld: 7.8 % — ABNORMAL HIGH (ref <5.7)
Mean Plasma Glucose: 177 mg/dL — ABNORMAL HIGH (ref <117)

## 2013-06-11 MED ORDER — SODIUM CHLORIDE 0.9 % IV SOLN
INTRAVENOUS | Status: DC
  Administered 2013-06-11 (×3): via INTRAVENOUS

## 2013-06-11 MED ORDER — POLYETHYLENE GLYCOL 3350 17 G PO PACK
17.0000 g | PACK | Freq: Every day | ORAL | Status: DC
  Administered 2013-06-11 – 2013-06-12 (×2): 17 g via ORAL
  Filled 2013-06-11 (×2): qty 1

## 2013-06-11 MED ORDER — INSULIN DETEMIR 100 UNIT/ML ~~LOC~~ SOLN
45.0000 [IU] | Freq: Two times a day (BID) | SUBCUTANEOUS | Status: DC
  Administered 2013-06-11 – 2013-06-12 (×2): 45 [IU] via SUBCUTANEOUS
  Filled 2013-06-11 (×3): qty 0.45

## 2013-06-11 NOTE — Progress Notes (Signed)
Pt CBG this am 48.  Pt  Alert and oriented.  Pt completely asymptomatic to CBG.  Skin warm, dry.  Denies dizziness or confusion.  Gave juice and snack.  Breakfast on hall, will re-check.

## 2013-06-11 NOTE — Progress Notes (Signed)
[facility] PROGRESS NOTE  Austin Kramer [id number] DOB: 08-16-1960 DOA: 06/10/2013 PCP: Wyatt Haste, MD  Assessment/Plan: 53 y.o. male with past medical history of daibetes, hypertension, CKD stage 3 who presented to [facility] ED 06/10/2013 with worsening abdominal pain especially in left flank, associated nausea and vomiting as well as diarrhea all started 1-2 days prior to this admission. On admission, patient's vitals were stable with BP of 154/96, HR 74 - 110 and T max 98.4 F. Oxygen saturation was 96% on room air. CT abdomen showed diffuse soft tissue stranding along the course of both ureters, with bilateral ureteral wall thickening, worse on the left which is most compatible with bilateral ureteritis. Findings also suspicious for cystitis. Patient was started on rocephin in ED.    Principal Problem:  1. Ureteritis, abdominal pain, nausea and vomiting  - UA on admission showed moderate leukocytes and too numerous to count WBC. CT abdomen was significant for ureteritis and cystitis and pt started on IV rocephin daily  - ordered urine culture 4/29 - continue supportive care with IV fluids, NS @ 100 cc/hr, continue antiemetics as needed  -h/o prostate CA s/p prostate ectomy; I have discussed with patient, recommend to f/u with urology in 1 week; he agreed  Active Problems:  2. Diabetes mellitus  - check A1c  - decrease  Levemir 45 units BID due to hypoglycemia  - continue SSI  3. CKD, stage 3  - likely related to diabetes, diabetic nephropathy  - creatinine is elevated at 2.76 on this admission which is above usual baseline values of 1.7  - continue IV fluids and follow up BMP in am  4. Hypertension  - continue Norvasc  Dyslipidemia  - continue statin therapy  Morbid obesity  - diet education provided this am   Code Status: full Family Communication:  D/w patient (indicate person spoken with, relationship, and if by phone, the number) Disposition Plan: home 24-48  hours    Consultants:  none  Procedures:  none  Antibiotics:  rocefin  (indicate start date, and stop date if known)  HPI/Subjective: alert  Objective: Filed Vitals:   06/11/13 0830  BP: 170/92  Pulse: 91  Temp: 98.1 F (36.7 C)  Resp: 18    Intake/Output Summary (Last 24 hours) at 06/11/13 1200 Last data filed at 06/11/13 0900  Gross per 24 hour  Intake   1460 ml  Output      4 ml  Net   1456 ml   Filed Weights   06/09/13 2109 06/10/13 0558 06/10/13 2111  Weight: 136.079 kg (300 lb) 135.535 kg (298 lb 12.8 oz) 136.578 kg (301 lb 1.6 oz)    Exam:   General:  alert  Cardiovascular: s1,s2 rrr  Respiratory: CTA BL  Abdomen: soft,nt, nd   Musculoskeletal: no LE edema   Data Reviewed: Basic Metabolic Panel:  Recent Labs Lab 06/09/13 2113 06/10/13 0835 06/11/13 0520  NA 139 135* 144  K 4.6 4.2 4.1  CL 100 99 107  CO2 25 23 25   GLUCOSE 198* 256* 81  BUN 34* 32* 34*  CREATININE 2.76* 2.68* 2.68*  CALCIUM 8.6 8.0* 8.4   Liver Function Tests:  Recent Labs Lab 06/09/13 2113 06/10/13 0835  AST 25 20  ALT 22 20  ALKPHOS 91 80  BILITOT 0.3 <0.2*  PROT 7.0 6.5  ALBUMIN 2.2* 2.0*    Recent Labs Lab 06/09/13 2113  LIPASE 22   No results found for this basename: AMMONIA,  in the last  168 hours CBC:  Recent Labs Lab 06/09/13 2113 06/10/13 0835 06/11/13 0520  WBC 14.7* 9.9 7.2  NEUTROABS 12.0* 7.1  --   HGB 11.7* 10.6* 10.4*  HCT 35.9* 33.6* 32.4*  MCV 75.6* 76.2* 75.3*  PLT 333 325 322   Cardiac Enzymes: No results found for this basename: CKTOTAL, CKMB, CKMBINDEX, TROPONINI,  in the last 168 hours BNP (last 3 results) No results found for this basename: PROBNP,  in the last 8760 hours CBG:  Recent Labs Lab 06/10/13 1215 06/10/13 1705 06/10/13 2111 06/11/13 0817 06/11/13 0841  GLUCAP 291* 184* 200* 48* 90    No results found for this or any previous visit (from the past 240 hour(s)).   Studies: Ct Abdomen Pelvis W  Contrast  06/10/2013   CLINICAL DATA:  Lower abdominal pain and vomiting. Left flank pain. Diarrhea.  EXAM: CT ABDOMEN AND PELVIS WITH CONTRAST  TECHNIQUE: Multidetector CT imaging of the abdomen and pelvis was performed using the standard protocol following bolus administration of intravenous contrast.  CONTRAST:  133mL OMNIPAQUE IOHEXOL 300 MG/ML  SOLN  COMPARISON:  CT of the abdomen and pelvis performed 09/03/2009  FINDINGS: The visualized lung bases are clear.  The liver and spleen are unremarkable in appearance. The gallbladder is within normal limits. Minimal calcification adjacent to the pancreatic head appears to reflect a 1.2 cm partially calcified lymph node on coronal images. The pancreas is unremarkable. The adrenal glands are within normal limits.  There is diffuse soft tissue stranding along the course of both ureters, worse on the left, with bilateral ureteral wall thickening, worse on the left. This is most compatible with bilateral ureteritis. Mild nonspecific perinephric stranding is noted bilaterally; no definite pyelonephritis is seen. There is no evidence of hydronephrosis. No renal or ureteral stones are identified. A 0.7 cm cyst is noted at the interpole region of the right kidney.  No free fluid is identified. The small bowel is unremarkable in appearance. The stomach is within normal limits. No acute vascular abnormalities are seen. Minimal calcification is noted along the distal abdominal aorta.  The appendix is normal in caliber and contains air, without evidence for appendicitis. The colon is unremarkable in appearance.  The bladder is largely decompressed; there is diffuse bladder wall thickening and irregularity, thought to reflect cystitis. The patient is status post prostatectomy. No inguinal lymphadenopathy is seen.  No acute osseous abnormalities are identified. A few subcortical cysts are noted at the right hip joint.  IMPRESSION: 1. Diffuse soft tissue stranding along the course  of both ureters, with bilateral ureteral wall thickening, worse on the left. This is most compatible with bilateral ureteritis. No definite evidence of pyelonephritis. 2. Diffuse bladder wall thickening and irregularity, thought to reflect cystitis. Cystoscopy could be considered following completion of treatment for infection, to exclude an underlying mass. 3. Small right renal cyst seen. 4. Apparent 1.2 cm partially calcified lymph node adjacent to the pancreatic head; this may reflect remote granulomatous disease.   Electronically Signed   By: Garald Balding M.D.   On: 06/10/2013 04:11    Scheduled Meds: . amLODipine  10 mg Oral Daily  . atorvastatin  40 mg Oral q1800  . cefTRIAXone (ROCEPHIN)  IV  1 g Intravenous Q24H  . enoxaparin (LOVENOX) injection  40 mg Subcutaneous Q24H  . ferrous sulfate  325 mg Oral Q breakfast  . insulin aspart  0-9 Units Subcutaneous TID WC  . insulin detemir  60 Units Subcutaneous BID  . omega-3 acid  ethyl esters  1 g Oral Daily  . pneumococcal 23 valent vaccine  0.5 mL Intramuscular Tomorrow-1000   Continuous Infusions:   Principal Problem:   Ureteritis Active Problems:   Diabetes mellitus   Hypertension associated with diabetes   Renal failure    Time spent: >35 minutes     Kinnie Feil  [facility] Pager [phone]. If 7PM-7AM, please contact night-coverage at [email or web address], password [facility] 06/11/2013, 12:00 PM  LOS: 1 day

## 2013-06-11 NOTE — Progress Notes (Signed)
Paged Dr. Daleen Bo, pt CBG 48/90 this am, ordered 60 units Levemir @ 1000, does he want to hold.

## 2013-06-11 NOTE — Patient Care Conference (Signed)
CBG re-check 90

## 2013-06-11 NOTE — Progress Notes (Signed)
Inpatient Diabetes Program Recommendations  AACE/ADA: New Consensus Statement on Inpatient Glycemic Control (2013)  Target Ranges:  Prepandial:   less than 140 mg/dL      Peak postprandial:   less than 180 mg/dL (1-2 hours)      Critically ill patients:  140 - 180 mg/dL     Results for Austin Kramer, Austin Kramer (MRN [id number]) as of 06/11/2013 11:37  Ref. Range 06/10/2013 07:53 06/10/2013 12:15 06/10/2013 17:05 06/10/2013 21:11  Glucose-Capillary Latest Range: 70-99 mg/dL 273 (H) 291 (H) 184 (H) 200 (H)    Results for Austin Kramer, Austin Kramer (MRN [id number]) as of 06/11/2013 11:37  Ref. Range 06/11/2013 08:17 06/11/2013 08:41  Glucose-Capillary Latest Range: 70-99 mg/dL 48 (L) 90     Patient with hyperglycemia yesterday.  Now with hypoglycemia this AM.  Eating 100% of meals.  Note Levemir dose held this AM (60 units).    MD- Please consider the following insulin adjustments:  1. Decrease Levemir to 50 units bid 2. Add Novolog Meal coverage to help with postprandial glucose excursions- Novolog 4 units tid with meals to start   Will follow Wyn Quaker RN, MSN, CDE Diabetes Coordinator Inpatient Diabetes Program Team Pager: [phone] (8a-10p)

## 2013-06-11 NOTE — Progress Notes (Signed)
Dr. Daleen Bo returned page, t/o hold levemir 60 units this am dose.

## 2013-06-12 DIAGNOSIS — E11319 Type 2 diabetes mellitus with unspecified diabetic retinopathy without macular edema: Secondary | ICD-10-CM

## 2013-06-12 DIAGNOSIS — E1139 Type 2 diabetes mellitus with other diabetic ophthalmic complication: Secondary | ICD-10-CM

## 2013-06-12 DIAGNOSIS — E669 Obesity, unspecified: Secondary | ICD-10-CM

## 2013-06-12 LAB — BASIC METABOLIC PANEL
BUN: 32 mg/dL — ABNORMAL HIGH (ref 6–23)
CALCIUM: 8.6 mg/dL (ref 8.4–10.5)
CO2: 22 mEq/L (ref 19–32)
Chloride: 103 mEq/L (ref 96–112)
Creatinine, Ser: 2.43 mg/dL — ABNORMAL HIGH (ref 0.50–1.35)
GFR calc Af Amer: 34 mL/min — ABNORMAL LOW (ref >90)
GFR, EST NON AFRICAN AMERICAN: 29 mL/min — AB (ref >90)
Glucose, Bld: 123 mg/dL — ABNORMAL HIGH (ref 70–99)
Potassium: 4.8 mEq/L (ref 3.7–5.3)
SODIUM: 137 meq/L (ref 137–147)

## 2013-06-12 LAB — GLUCOSE, CAPILLARY: GLUCOSE-CAPILLARY: 121 mg/dL — AB (ref 70–99)

## 2013-06-12 LAB — URINE CULTURE
COLONY COUNT: NO GROWTH
Culture: NO GROWTH

## 2013-06-12 MED ORDER — INSULIN DETEMIR 100 UNIT/ML ~~LOC~~ SOLN: 50.0000 [IU] | Freq: Two times a day (BID) | SUBCUTANEOUS | Status: DC

## 2013-06-12 MED ORDER — LEVOFLOXACIN 500 MG PO TABS: 500.0000 mg | ORAL_TABLET | Freq: Every day | ORAL | Status: DC

## 2013-06-12 NOTE — Progress Notes (Signed)
Blood pressure equals 164/96, notified NP on call. Norvasc to be given earlier per NP's order. Will continue to monitor patient.

## 2013-06-12 NOTE — Discharge Summary (Signed)
Physician Discharge Summary  Austin Kramer [id number] DOB: Nov 03, 1960 DOA: 06/10/2013  PCP: Wyatt Haste, MD  Admit date: 06/10/2013 Discharge date: 06/12/2013  Time spent: >35 minutes  Recommendations for Outpatient Follow-up:  F/u with urologist in 2 weeks; d/w patient agreed  F/u with PCP in 1-2 weeks  Discharge Diagnoses:  Principal Problem:   Ureteritis Active Problems:   Diabetes mellitus   Hypertension associated with diabetes   Renal failure   Discharge Condition: stable   Diet recommendation: DM  Filed Weights   06/10/13 0558 06/10/13 2111 06/11/13 2151  Weight: 135.535 kg (298 lb 12.8 oz) 136.578 kg (301 lb 1.6 oz) 136.75 kg (301 lb 7.7 oz)    History of present illness:  53 y.o. male with past medical history of daibetes, hypertension, CKD stage 3 who presented to [facility] ED 06/10/2013 with worsening abdominal pain especially in left flank, associated nausea and vomiting as well as diarrhea all started 1-2 days prior to this admission. On admission, patient's vitals were stable with BP of 154/96, HR 74 - 110 and T max 98.4 F. Oxygen saturation was 96% on room air. CT abdomen showed diffuse soft tissue stranding along the course of both ureters, with bilateral ureteral wall thickening, worse on the left which is most compatible with bilateral ureteritis. Findings also suspicious for cystitis. Patient was started on rocephin in ED.    Hospital Course:  1. Ureteritis, abdominal pain, nausea and vomiting  - UA on admission showed moderate leukocytes and too numerous to count WBC. CT abdomen was significant for ureteritis and cystitis and pt started on IV atx  - afebrile; improved on atx; ordered urine culture 4/29, pend d/w patient kindly agreed to f/u cultures with PCP in 1 week  - changed atx to PO to complete outpatient treatment  -h/o prostate CA s/p prostate ectomy; I have discussed with patient, recommend to f/u with urology in 1 week; he agreed  Active  Problems:  2. Diabetes mellitus  - check A1c 7.8 - decrease Levemir 50 units BID due to mild hypoglycemia  3. CKD, stage 3  - likely related to diabetes, diabetic nephropathy  - creatinine is elevated at 2.76 on this admission which is above usual baseline values of 1.7  - continue to improve on IV fluids; outpatient BMP in 1 week  4. Hypertension  - continue Norvasc  Dyslipidemia  - continue statin therapy  Morbid obesity  - diet education provided this am CT: Apparent 1.2 cm partially calcified lymph node adjacent to the  pancreatic head; this may reflect remote granulomatous disease -recommended outpatient f/u imaging    Procedures:  none (i.e. Studies not automatically included, echos, thoracentesis, etc; not x-rays)  Consultations:  none  Discharge Exam: Filed Vitals:   06/12/13 0550  BP: 164/96  Pulse: 86  Temp: 98.6 F (37 C)  Resp: 18    General: alert Cardiovascular: s1,s2 rrr Respiratory: CTA BL  Discharge Instructions  Discharge Orders   Future Orders Complete By Expires   Diet - low sodium heart healthy  As directed    Discharge instructions  As directed    Increase activity slowly  As directed        Medication List         amLODipine 5 MG tablet  Commonly known as:  NORVASC  Take 2 tablets (10 mg total) by mouth daily.     ferrous sulfate 325 (65 FE) MG tablet  Take 325 mg by mouth daily with breakfast.  fish oil-omega-3 fatty acids 1000 MG capsule  Take 1 g by mouth daily.     insulin detemir 100 UNIT/ML injection  Commonly known as:  LEVEMIR  Inject 0.5 mLs (50 Units total) into the skin 2 (two) times daily.     insulin lispro 100 UNIT/ML injection  Commonly known as:  HUMALOG  Inject 20-25 Units into the skin 2 (two) times daily. For low blood sugar as needed. Sliding scale     levofloxacin 500 MG tablet  Commonly known as:  LEVAQUIN  Take 1 tablet (500 mg total) by mouth daily.     multivitamin with minerals Tabs tablet   Take 1 tablet by mouth daily.     rosuvastatin 20 MG tablet  Commonly known as:  CRESTOR  Take 1 tablet (20 mg total) by mouth daily.       No Known Allergies     Follow-up Information   Follow up with Wyatt Haste, MD. Schedule an appointment as soon as possible for a visit in 1 week.   Specialty:  Family Medicine   Contact information:   [place] [address] [phone]        The results of significant diagnostics from this hospitalization (including imaging, microbiology, ancillary and laboratory) are listed below for reference.    Significant Diagnostic Studies: Ct Abdomen Pelvis W Contrast  06/10/2013   CLINICAL DATA:  Lower abdominal pain and vomiting. Left flank pain. Diarrhea.  EXAM: CT ABDOMEN AND PELVIS WITH CONTRAST  TECHNIQUE: Multidetector CT imaging of the abdomen and pelvis was performed using the standard protocol following bolus administration of intravenous contrast.  CONTRAST:  141mL OMNIPAQUE IOHEXOL 300 MG/ML  SOLN  COMPARISON:  CT of the abdomen and pelvis performed 09/03/2009  FINDINGS: The visualized lung bases are clear.  The liver and spleen are unremarkable in appearance. The gallbladder is within normal limits. Minimal calcification adjacent to the pancreatic head appears to reflect a 1.2 cm partially calcified lymph node on coronal images. The pancreas is unremarkable. The adrenal glands are within normal limits.  There is diffuse soft tissue stranding along the course of both ureters, worse on the left, with bilateral ureteral wall thickening, worse on the left. This is most compatible with bilateral ureteritis. Mild nonspecific perinephric stranding is noted bilaterally; no definite pyelonephritis is seen. There is no evidence of hydronephrosis. No renal or ureteral stones are identified. A 0.7 cm cyst is noted at the interpole region of the right kidney.  No free fluid is identified. The small bowel is unremarkable in  appearance. The stomach is within normal limits. No acute vascular abnormalities are seen. Minimal calcification is noted along the distal abdominal aorta.  The appendix is normal in caliber and contains air, without evidence for appendicitis. The colon is unremarkable in appearance.  The bladder is largely decompressed; there is diffuse bladder wall thickening and irregularity, thought to reflect cystitis. The patient is status post prostatectomy. No inguinal lymphadenopathy is seen.  No acute osseous abnormalities are identified. A few subcortical cysts are noted at the right hip joint.  IMPRESSION: 1. Diffuse soft tissue stranding along the course of both ureters, with bilateral ureteral wall thickening, worse on the left. This is most compatible with bilateral ureteritis. No definite evidence of pyelonephritis. 2. Diffuse bladder wall thickening and irregularity, thought to reflect cystitis. Cystoscopy could be considered following completion of treatment for infection, to exclude an underlying mass. 3. Small right renal cyst seen. 4. Apparent 1.2 cm partially  calcified lymph node adjacent to the pancreatic head; this may reflect remote granulomatous disease.   Electronically Signed   By: Garald Balding M.D.   On: 06/10/2013 04:11    Microbiology: No results found for this or any previous visit (from the past 240 hour(s)).   Labs: Basic Metabolic Panel:  Recent Labs Lab 06/09/13 2113 06/10/13 0835 06/11/13 0520 06/12/13 0728  NA 139 135* 144 137  K 4.6 4.2 4.1 4.8  CL 100 99 107 103  CO2 25 23 25 22   GLUCOSE 198* 256* 81 123*  BUN 34* 32* 34* 32*  CREATININE 2.76* 2.68* 2.68* 2.43*  CALCIUM 8.6 8.0* 8.4 8.6   Liver Function Tests:  Recent Labs Lab 06/09/13 2113 06/10/13 0835  AST 25 20  ALT 22 20  ALKPHOS 91 80  BILITOT 0.3 <0.2*  PROT 7.0 6.5  ALBUMIN 2.2* 2.0*    Recent Labs Lab 06/09/13 2113  LIPASE 22   No results found for this basename: AMMONIA,  in the last 168  hours CBC:  Recent Labs Lab 06/09/13 2113 06/10/13 0835 06/11/13 0520  WBC 14.7* 9.9 7.2  NEUTROABS 12.0* 7.1  --   HGB 11.7* 10.6* 10.4*  HCT 35.9* 33.6* 32.4*  MCV 75.6* 76.2* 75.3*  PLT 333 325 322   Cardiac Enzymes: No results found for this basename: CKTOTAL, CKMB, CKMBINDEX, TROPONINI,  in the last 168 hours BNP: BNP (last 3 results) No results found for this basename: PROBNP,  in the last 8760 hours CBG:  Recent Labs Lab 06/11/13 0817 06/11/13 0841 06/11/13 1213 06/11/13 1640 06/11/13 2149  GLUCAP 48* 90 106* 114* 181*       Signed:  Arie Sabina Goran Olden  [facility] 06/12/2013, 8:55 AM

## 2013-06-12 NOTE — Progress Notes (Signed)
Patient discharged.  Educated on discharge instructions, follow-up appointments, and discharge medications.  Educated on ureteritis, signs and symptoms, and when to call a doctor.  Patient verbalized understanding, AVS signed. IV removed.  Belongings gathered. Escorted to car in wheelchair with volunteer services.

## 2014-04-09 ENCOUNTER — Ambulatory Visit (INDEPENDENT_AMBULATORY_CARE_PROVIDER_SITE_OTHER): Payer: BLUE CROSS/BLUE SHIELD | Admitting: Family Medicine

## 2014-04-09 ENCOUNTER — Encounter: Payer: Self-pay | Admitting: Family Medicine

## 2014-04-09 VITALS — BP 144/100 | HR 116 | Wt 292.0 lb

## 2014-04-09 DIAGNOSIS — E119 Type 2 diabetes mellitus without complications: Secondary | ICD-10-CM

## 2014-04-09 LAB — POCT GLYCOSYLATED HEMOGLOBIN (HGB A1C): HEMOGLOBIN A1C: 7

## 2014-04-09 NOTE — Patient Instructions (Addendum)
Check your blood sugars 3 times per day.  before a meal l or if you feel like your sugar is low. Stick with the plan of twice a day of the Levemir and 10 units of Humalog before meals but also if you notice your blood sugar is low, checked I want you doing her normal daily activities

## 2014-04-09 NOTE — Progress Notes (Signed)
   Subjective:    Patient ID: Austin Kramer, male    DOB: Mar 25, 1960, 54 y.o.   MRN: [id number]  HPI He is here for consult concerning his diabetes. He normally gets his care from Dr. Chalmers Cater and apparently did make a phone call concerning the present condition. Over the last 2 weeks he and his wife have made major dietary changes. He has had at least 3 episodes of low blood sugar during this timeframe. He also has not been checking his blood sugars in spite of using a sliding scale. Apparently he does not have a glucometer. Presently he is on Levemir 50 twice a day and sliding scale. He is here because of difficulty with low blood sugar today.   Review of Systems     Objective:   Physical Exam Alert and in no distress. Blood sugar was 56 and we did give sugar to him. Hemoglobin A1c was 7.0      Assessment & Plan:  Type 2 diabetes mellitus without complication  he was given a glucometer. I reinforced the fact that he needs to be checking his blood sugars 3 times per day. He will maintain his Levemir 50 twice a day and use 10 units of NovoLog before meals. He is to keep track of his sugars and if he has a low sugar, monitor that. He will follow-up with Dr. Chalmers Cater on Tuesday. 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

## 2014-04-27 ENCOUNTER — Telehealth: Payer: Self-pay | Admitting: Internal Medicine

## 2014-04-27 NOTE — Telephone Encounter (Signed)
error 

## 2014-05-15 ENCOUNTER — Other Ambulatory Visit: Payer: Self-pay

## 2014-05-15 DIAGNOSIS — Z01810 Encounter for preprocedural cardiovascular examination: Secondary | ICD-10-CM

## 2014-05-15 DIAGNOSIS — N184 Chronic kidney disease, stage 4 (severe): Secondary | ICD-10-CM

## 2014-06-19 ENCOUNTER — Encounter: Payer: Self-pay | Admitting: Surgery

## 2014-06-22 ENCOUNTER — Ambulatory Visit (HOSPITAL_COMMUNITY)
Admission: RE | Admit: 2014-06-22 | Discharge: 2014-06-22 | Disposition: A | Discharge status: Home / Self Care | Payer: BLUE CROSS/BLUE SHIELD | Source: Ambulatory Visit | Attending: Surgery | Admitting: Surgery

## 2014-06-22 ENCOUNTER — Ambulatory Visit (INDEPENDENT_AMBULATORY_CARE_PROVIDER_SITE_OTHER): Payer: BLUE CROSS/BLUE SHIELD | Admitting: Surgery

## 2014-06-22 ENCOUNTER — Encounter: Payer: Self-pay | Admitting: Surgery

## 2014-06-22 ENCOUNTER — Ambulatory Visit (INDEPENDENT_AMBULATORY_CARE_PROVIDER_SITE_OTHER)
Admission: RE | Admit: 2014-06-22 | Discharge: 2014-06-22 | Disposition: A | Discharge status: Home / Self Care | Payer: BLUE CROSS/BLUE SHIELD | Source: Ambulatory Visit | Attending: Surgery | Admitting: Surgery

## 2014-06-22 VITALS — BP 176/105 | HR 98 | Ht 72.0 in | Wt 291.3 lb

## 2014-06-22 DIAGNOSIS — Z01810 Encounter for preprocedural cardiovascular examination: Secondary | ICD-10-CM

## 2014-06-22 DIAGNOSIS — N184 Chronic kidney disease, stage 4 (severe): Secondary | ICD-10-CM

## 2014-06-22 NOTE — Progress Notes (Signed)
Patient name: Austin Kramer MRN: [id number] DOB: Jan 18, 1961 Sex: male   Referred by: Dr. Justin Mend  Reason for referral:  Chief Complaint  Patient presents with  . New Evaluation    eval for access     HISTORY OF PRESENT ILLNESS:  this is a 54 year old gentleman who comes in today for evaluation of chronic renal insufficiency, stage IV.  He is right-handed. His renal failure secondary to diabetes and hypertension. He reports having a difficult time getting his diabetes under control, however now he is very proud that it is getting better.  His most recent hemoglobin A1c was 7.0. The patient's hypertension has been difficult to control.  It fluctuates from day to day and is moderately or intermittently controlled.  The patient is a nonsmoker.  Past Medical History  Diagnosis Date  . Diabetes mellitus   . ED (erectile dysfunction)   . Obesity   . Cancer     PROSTATE  . PPD positive, treated   . Hypertension     [name]  . Neuromuscular disorder     neuropathy, "tingling in feet"  . GERD (gastroesophageal reflux disease)     occasional    Past Surgical History  Procedure Laterality Date  . Vasectomy    . Prostatectomy  2011  . Knee cartilage surgery    . Amputation  01/26/2012    Procedure: AMPUTATION RAY;  Surgeon: Newt Minion, MD;  Location: [facility];  Service: Orthopedics;  Laterality: Right;  Right foot 2nd ray amputation  . Colonoscopy    . Pars plana vitrectomy Left 12/17/2012    Procedure: LEFT PARS PLANA VITRECTOMY WITH 25 GAUGE/ENDO LASER;  Surgeon: Hurman Horn, MD;  Location: [facility];  Service: Ophthalmology;  Laterality: Left;  . Photocoagulation with laser Left 12/17/2012    Procedure: PHOTOCOAGULATION WITH LASER;  Surgeon: Hurman Horn, MD;  Location: [facility];  Service: Ophthalmology;  Laterality: Left;    History   Social History  . Marital Status: Married    Spouse Name: N/A  . Number of Children: N/A  . Years of Education: N/A    Occupational History  . Not on file.   Social History Main Topics  . Smoking status: Never Smoker   . Smokeless tobacco: Never Used  . Alcohol Use: No  . Drug Use: No  . Sexual Activity: Yes   Other Topics Concern  . Not on file   Social History Narrative    Family History  Problem Relation Age of Onset  . Diabetes Mother   . Diabetes Father     Allergies as of 06/22/2014  . (No Known Allergies)    Current Outpatient Prescriptions on File Prior to Visit  Medication Sig Dispense Refill  . amLODipine (NORVASC) 10 MG tablet Take 10 mg by mouth daily.    . ferrous sulfate 325 (65 FE) MG tablet Take 325 mg by mouth daily with breakfast.    . fish oil-omega-3 fatty acids 1000 MG capsule Take 1 g by mouth daily.    . hydrochlorothiazide (HYDRODIURIL) 25 MG tablet Take 25 mg by mouth daily.    . insulin detemir (LEVEMIR) 100 UNIT/ML injection Inject 0.5 mLs (50 Units total) into the skin 2 (two) times daily. 20 mL 0  . insulin lispro (HUMALOG) 100 UNIT/ML injection Inject 20-25 Units into the skin 2 (two) times daily. For low blood sugar as needed. Sliding scale 20 mL 0  . Multiple Vitamin (MULTIVITAMIN WITH  MINERALS) TABS Take 1 tablet by mouth daily.    . rosuvastatin (CRESTOR) 20 MG tablet Take 1 tablet (20 mg total) by mouth daily. 49 tablet 0   No current facility-administered medications on file prior to visit.     REVIEW OF SYSTEMS: Cardiovascular: No chest pain, chest pressure, palpitations,No claudication or rest pain,  No history of DVT or phlebitis. Positive for shortness of breath with exertion, leg swelling and varicose veins Pulmonary: No productive cough, asthma or wheezing. Neurologic: No weakness, paresthesias, aphasia, or amaurosis. No dizziness. Hematologic: No bleeding problems or clotting disorders. Musculoskeletal: No joint pain or joint swelling. Gastrointestinal: No blood in stool or hematemesis Genitourinary: No dysuria or hematuria. Psychiatric::  No history of major depression. Integumentary: No rashes or ulcers. Constitutional: No fever or chills.  PHYSICAL EXAMINATION:  Filed Vitals:   06/22/14 1603  BP: 176/105  Pulse: 98  Height: 6' (1.829 m)  Weight: 291 lb 4.8 oz (132.133 kg)  SpO2: 98%   Body mass index is 39.5 kg/(m^2). General: The patient appears their stated age.   HEENT:  No gross abnormalities Pulmonary: Respirations are non-labored Musculoskeletal: There are no major deformities.   Neurologic: No focal weakness or paresthesias are detected, Skin: There are no ulcer or rashes noted. Psychiatric: The patient has normal affect. Cardiovascular: There is a regular rate and rhythm without significant murmur appreciated. Palpable left radial pulse.  No carotid bruits  Diagnostic Studies:  duplex studies have been reviewed.  He has triphasic arterial blood flow bilaterally with a normal Allen test.   I have reviewed his vein mapping. He has an excellent basilic vein above the antecubital crease.  The cephalic vein from the wrist to the elbow is a nice size vein. The cephalic vein drains into the basilic vein at the elbow.   Assessment:   stage IV renal insufficiency Plan:  we have reviewed his vein mapping.  I feel the best option is a left radiocephalic vein.  The cephalic vein in the upper arm does not appear to be continuous with the lower arm cephalic vein which is of adequate size.  I discussed the risks and benefits of the procedure including the risk of steal syndrome as well as the  Risk of non-maturity requiring additional interventions.  The patient is getting ready to take a long vacation and would like to get this scheduled for July.     Eldridge Abrahams, M.D. [facility] Office: [phone] Pager:  [phone]

## 2014-07-23 ENCOUNTER — Telehealth: Payer: Self-pay | Admitting: Family Medicine

## 2014-07-23 MED ORDER — ROSUVASTATIN CALCIUM 20 MG PO TABS: 20.0000 mg | ORAL_TABLET | Freq: Every day | ORAL | Status: DC

## 2014-07-23 NOTE — Telephone Encounter (Signed)
Give him a card

## 2014-07-23 NOTE — Telephone Encounter (Signed)
Needs refill on crestor  Also wants "copay/discount" card for crestor

## 2014-07-24 ENCOUNTER — Other Ambulatory Visit: Payer: Self-pay

## 2014-07-24 MED ORDER — ROSUVASTATIN CALCIUM 20 MG PO TABS: 20.0000 mg | ORAL_TABLET | Freq: Every day | ORAL | Status: DC

## 2014-07-24 NOTE — Telephone Encounter (Signed)
Talked with pt and put on back steps for him to pick up

## 2014-09-02 ENCOUNTER — Other Ambulatory Visit: Payer: Self-pay

## 2014-10-08 ENCOUNTER — Encounter (HOSPITAL_COMMUNITY): Payer: Self-pay | Admitting: *Deleted

## 2014-10-08 MED ORDER — DEXTROSE 5 % IV SOLN
1.5000 g | INTRAVENOUS | Status: AC
  Administered 2014-10-09: 1.5 g via INTRAVENOUS
  Filled 2014-10-08: qty 1.5

## 2014-10-08 MED ORDER — SODIUM CHLORIDE 0.9 % IV SOLN
INTRAVENOUS | Status: DC
  Administered 2014-10-09: 10:00:00 via INTRAVENOUS

## 2014-10-08 MED ORDER — CHLORHEXIDINE GLUCONATE CLOTH 2 % EX PADS: 6.0000 | MEDICATED_PAD | Freq: Once | CUTANEOUS | Status: DC

## 2014-10-08 NOTE — Progress Notes (Signed)
Pt denies SOB, chest pain, and being under the care of a cardiologist. Pt denies having a stress test, echo and cardiac cath. Pt denies having a chest x ray and EKG within the last year. Pt made aware to stop taking otc vitamins, herbal medications such as Fish Oil and NSAID's. Pt verbalized understanding of all pre-op instructions.

## 2014-10-08 NOTE — Progress Notes (Signed)
   10/08/14 1218  OBSTRUCTIVE SLEEP APNEA  Have you ever been diagnosed with sleep apnea through a sleep study? No  Do you snore loudly (loud enough to be heard through closed doors)?  0  Do you often feel tired, fatigued, or sleepy during the daytime? 0  Has anyone observed you stop breathing during your sleep? 1  Do you have, or are you being treated for high blood pressure? 1  BMI more than 35 kg/m2? 1  Age over 54 years old? 1  Neck circumference greater than 40 cm/16 inches? 1  Gender: 1  Obstructive Sleep Apnea Score 6

## 2014-10-09 ENCOUNTER — Encounter (HOSPITAL_COMMUNITY)
Admission: RE | Disposition: A | Discharge status: Home / Self Care | Payer: Self-pay | Source: Ambulatory Visit | Attending: Surgery

## 2014-10-09 ENCOUNTER — Ambulatory Visit (HOSPITAL_COMMUNITY): Payer: BLUE CROSS/BLUE SHIELD | Admitting: Anesthesiology

## 2014-10-09 ENCOUNTER — Encounter (HOSPITAL_COMMUNITY): Payer: Self-pay | Admitting: *Deleted

## 2014-10-09 ENCOUNTER — Other Ambulatory Visit: Payer: Self-pay | Admitting: *Deleted

## 2014-10-09 ENCOUNTER — Ambulatory Visit (HOSPITAL_COMMUNITY)
Admission: RE | Admit: 2014-10-09 | Discharge: 2014-10-09 | Disposition: A | Discharge status: Home / Self Care | Payer: BLUE CROSS/BLUE SHIELD | Source: Ambulatory Visit | Attending: Surgery | Admitting: Surgery

## 2014-10-09 DIAGNOSIS — Z6835 Body mass index (BMI) 35.0-35.9, adult: Secondary | ICD-10-CM | POA: Insufficient documentation

## 2014-10-09 DIAGNOSIS — Z79899 Other long term (current) drug therapy: Secondary | ICD-10-CM | POA: Diagnosis not present

## 2014-10-09 DIAGNOSIS — E1122 Type 2 diabetes mellitus with diabetic chronic kidney disease: Secondary | ICD-10-CM | POA: Diagnosis not present

## 2014-10-09 DIAGNOSIS — I120 Hypertensive chronic kidney disease with stage 5 chronic kidney disease or end stage renal disease: Secondary | ICD-10-CM | POA: Diagnosis not present

## 2014-10-09 DIAGNOSIS — K219 Gastro-esophageal reflux disease without esophagitis: Secondary | ICD-10-CM | POA: Diagnosis not present

## 2014-10-09 DIAGNOSIS — N184 Chronic kidney disease, stage 4 (severe): Secondary | ICD-10-CM | POA: Diagnosis not present

## 2014-10-09 DIAGNOSIS — E1142 Type 2 diabetes mellitus with diabetic polyneuropathy: Secondary | ICD-10-CM | POA: Diagnosis not present

## 2014-10-09 DIAGNOSIS — N529 Male erectile dysfunction, unspecified: Secondary | ICD-10-CM | POA: Diagnosis not present

## 2014-10-09 DIAGNOSIS — N186 End stage renal disease: Secondary | ICD-10-CM

## 2014-10-09 DIAGNOSIS — Z794 Long term (current) use of insulin: Secondary | ICD-10-CM | POA: Insufficient documentation

## 2014-10-09 DIAGNOSIS — Z4931 Encounter for adequacy testing for hemodialysis: Secondary | ICD-10-CM

## 2014-10-09 DIAGNOSIS — Z89421 Acquired absence of other right toe(s): Secondary | ICD-10-CM | POA: Insufficient documentation

## 2014-10-09 DIAGNOSIS — Z8546 Personal history of malignant neoplasm of prostate: Secondary | ICD-10-CM | POA: Insufficient documentation

## 2014-10-09 HISTORY — PX: AV FISTULA PLACEMENT: SHX1204

## 2014-10-09 LAB — GLUCOSE, CAPILLARY
GLUCOSE-CAPILLARY: 69 mg/dL (ref 65–99)
GLUCOSE-CAPILLARY: 59 mg/dL — AB (ref 65–99)
GLUCOSE-CAPILLARY: 82 mg/dL (ref 65–99)
Glucose-Capillary: 87 mg/dL (ref 65–99)

## 2014-10-09 LAB — POCT I-STAT 4, (NA,K, GLUC, HGB,HCT)
GLUCOSE: 69 mg/dL (ref 65–99)
HEMATOCRIT: 33 % — AB (ref 39.0–52.0)
HEMOGLOBIN: 11.2 g/dL — AB (ref 13.0–17.0)
Potassium: 3.6 mmol/L (ref 3.5–5.1)
Sodium: 140 mmol/L (ref 135–145)

## 2014-10-09 SURGERY — ARTERIOVENOUS (AV) FISTULA CREATION
Anesthesia: Monitor Anesthesia Care | Site: Arm Lower | Laterality: Left

## 2014-10-09 MED ORDER — LIDOCAINE HCL (CARDIAC) 20 MG/ML IV SOLN
INTRAVENOUS | Status: AC
  Filled 2014-10-09: qty 10

## 2014-10-09 MED ORDER — SODIUM CHLORIDE 0.9 % IV SOLN: INTRAVENOUS | Status: DC

## 2014-10-09 MED ORDER — MIDAZOLAM HCL 2 MG/2ML IJ SOLN
INTRAMUSCULAR | Status: AC
  Filled 2014-10-09: qty 2

## 2014-10-09 MED ORDER — FENTANYL CITRATE (PF) 250 MCG/5ML IJ SOLN
INTRAMUSCULAR | Status: DC | PRN
  Administered 2014-10-09: 50 ug via INTRAVENOUS
  Administered 2014-10-09 (×2): 25 ug via INTRAVENOUS

## 2014-10-09 MED ORDER — EPHEDRINE SULFATE 50 MG/ML IJ SOLN
INTRAMUSCULAR | Status: AC
  Filled 2014-10-09: qty 1

## 2014-10-09 MED ORDER — HEPARIN SODIUM (PORCINE) 1000 UNIT/ML IJ SOLN
INTRAMUSCULAR | Status: AC
  Filled 2014-10-09: qty 2

## 2014-10-09 MED ORDER — MIDAZOLAM HCL 2 MG/2ML IJ SOLN
INTRAMUSCULAR | Status: DC | PRN
  Administered 2014-10-09: 2 mg via INTRAVENOUS

## 2014-10-09 MED ORDER — HEPARIN SODIUM (PORCINE) 1000 UNIT/ML IJ SOLN
INTRAMUSCULAR | Status: DC | PRN
  Administered 2014-10-09: 3000 [IU] via INTRAVENOUS

## 2014-10-09 MED ORDER — LIDOCAINE-EPINEPHRINE 1 %-1:100000 IJ SOLN
INTRAMUSCULAR | Status: DC | PRN
  Administered 2014-10-09: 10 mL via INTRADERMAL

## 2014-10-09 MED ORDER — FENTANYL CITRATE (PF) 250 MCG/5ML IJ SOLN
INTRAMUSCULAR | Status: AC
  Filled 2014-10-09: qty 5

## 2014-10-09 MED ORDER — PROPOFOL 10 MG/ML IV BOLUS
INTRAVENOUS | Status: AC
  Filled 2014-10-09: qty 20

## 2014-10-09 MED ORDER — DEXTROSE 50 % IV SOLN
INTRAVENOUS | Status: AC
  Administered 2014-10-09: 5 mL via INTRAVENOUS
  Filled 2014-10-09: qty 50

## 2014-10-09 MED ORDER — 0.9 % SODIUM CHLORIDE (POUR BTL) OPTIME
TOPICAL | Status: DC | PRN
  Administered 2014-10-09: 1000 mL

## 2014-10-09 MED ORDER — OXYCODONE-ACETAMINOPHEN 5-325 MG PO TABS: 1.0000 | ORAL_TABLET | Freq: Four times a day (QID) | ORAL | Status: DC | PRN

## 2014-10-09 MED ORDER — PROPOFOL INFUSION 10 MG/ML OPTIME
INTRAVENOUS | Status: DC | PRN
  Administered 2014-10-09: 50 ug/kg/min via INTRAVENOUS

## 2014-10-09 MED ORDER — ROCURONIUM BROMIDE 50 MG/5ML IV SOLN
INTRAVENOUS | Status: AC
  Filled 2014-10-09: qty 1

## 2014-10-09 MED ORDER — DEXTROSE 50 % IV SOLN
25.0000 mL | Freq: Once | INTRAVENOUS | Status: AC
  Administered 2014-10-09: 25 mL via INTRAVENOUS
  Filled 2014-10-09: qty 50

## 2014-10-09 MED ORDER — ONDANSETRON HCL 4 MG/2ML IJ SOLN
INTRAMUSCULAR | Status: AC
Start: 2014-10-09 — End: 2014-10-09
  Filled 2014-10-09: qty 2

## 2014-10-09 MED ORDER — SODIUM CHLORIDE 0.9 % IR SOLN
Status: DC | PRN
  Administered 2014-10-09: 500 mL

## 2014-10-09 MED ORDER — PROTAMINE SULFATE 10 MG/ML IV SOLN
INTRAVENOUS | Status: DC | PRN
  Administered 2014-10-09: 5 mg via INTRAVENOUS
  Administered 2014-10-09 (×2): 10 mg via INTRAVENOUS

## 2014-10-09 MED ORDER — LIDOCAINE-EPINEPHRINE 1 %-1:100000 IJ SOLN
INTRAMUSCULAR | Status: AC
  Filled 2014-10-09: qty 1

## 2014-10-09 MED ORDER — ARTIFICIAL TEARS OP OINT
TOPICAL_OINTMENT | OPHTHALMIC | Status: AC
  Filled 2014-10-09: qty 3.5

## 2014-10-09 MED ORDER — PROTAMINE SULFATE 10 MG/ML IV SOLN
INTRAVENOUS | Status: AC
  Filled 2014-10-09: qty 5

## 2014-10-09 MED ORDER — SUCCINYLCHOLINE CHLORIDE 20 MG/ML IJ SOLN
INTRAMUSCULAR | Status: AC
  Filled 2014-10-09: qty 1

## 2014-10-09 MED ORDER — SODIUM CHLORIDE 0.9 % IJ SOLN
INTRAMUSCULAR | Status: AC
  Filled 2014-10-09: qty 10

## 2014-10-09 SURGICAL SUPPLY — 35 items
ARMBAND PINK RESTRICT EXTREMIT (MISCELLANEOUS) ×3 IMPLANT
CANISTER SUCTION 2500CC (MISCELLANEOUS) ×3 IMPLANT
CLIP TI MEDIUM 6 (CLIP) ×3 IMPLANT
CLIP TI WIDE RED SMALL 6 (CLIP) ×3 IMPLANT
COVER PROBE W GEL 5X96 (DRAPES) ×3 IMPLANT
DECANTER SPIKE VIAL GLASS SM (MISCELLANEOUS) ×3 IMPLANT
ELECT REM PT RETURN 9FT ADLT (ELECTROSURGICAL) ×3
ELECTRODE REM PT RTRN 9FT ADLT (ELECTROSURGICAL) ×1 IMPLANT
GLOVE BIO SURGEON STRL SZ 6.5 (GLOVE) ×4 IMPLANT
GLOVE BIO SURGEONS STRL SZ 6.5 (GLOVE) ×2
GLOVE BIOGEL PI IND STRL 6.5 (GLOVE) ×1 IMPLANT
GLOVE BIOGEL PI IND STRL 7.0 (GLOVE) ×1 IMPLANT
GLOVE BIOGEL PI IND STRL 7.5 (GLOVE) ×1 IMPLANT
GLOVE BIOGEL PI INDICATOR 6.5 (GLOVE) ×2
GLOVE BIOGEL PI INDICATOR 7.0 (GLOVE) ×2
GLOVE BIOGEL PI INDICATOR 7.5 (GLOVE) ×2
GLOVE ECLIPSE 7.5 STRL STRAW (GLOVE) ×12 IMPLANT
GLOVE SURG SS PI 7.5 STRL IVOR (GLOVE) ×3 IMPLANT
GOWN STRL REUS W/ TWL LRG LVL3 (GOWN DISPOSABLE) ×1 IMPLANT
GOWN STRL REUS W/ TWL XL LVL3 (GOWN DISPOSABLE) ×3 IMPLANT
GOWN STRL REUS W/TWL LRG LVL3 (GOWN DISPOSABLE) ×2
GOWN STRL REUS W/TWL XL LVL3 (GOWN DISPOSABLE) ×6
HEMOSTAT SNOW SURGICEL 2X4 (HEMOSTASIS) IMPLANT
KIT BASIN OR (CUSTOM PROCEDURE TRAY) ×3 IMPLANT
KIT ROOM TURNOVER OR (KITS) ×3 IMPLANT
LIQUID BAND (GAUZE/BANDAGES/DRESSINGS) ×3 IMPLANT
NS IRRIG 1000ML POUR BTL (IV SOLUTION) ×3 IMPLANT
PACK CV ACCESS (CUSTOM PROCEDURE TRAY) ×3 IMPLANT
PAD ARMBOARD 7.5X6 YLW CONV (MISCELLANEOUS) ×6 IMPLANT
SUT PROLENE 6 0 CC (SUTURE) ×3 IMPLANT
SUT VIC AB 3-0 SH 27 (SUTURE) ×2
SUT VIC AB 3-0 SH 27X BRD (SUTURE) ×1 IMPLANT
SUT VICRYL 4-0 PS2 18IN ABS (SUTURE) ×3 IMPLANT
UNDERPAD 30X30 INCONTINENT (UNDERPADS AND DIAPERS) ×3 IMPLANT
WATER STERILE IRR 1000ML POUR (IV SOLUTION) IMPLANT

## 2014-10-09 NOTE — Transfer of Care (Signed)
Immediate Anesthesia Transfer of Care Note  Patient: Austin Kramer  Procedure(s) Performed: Procedure(s): CREATION OF LEFT RADIOCEPHALIC ARTERIOVENOUS (AV) FISTULA  (Left)  Patient Location: PACU  Anesthesia Type:MAC  Level of Consciousness: awake and alert   Airway & Oxygen Therapy: Patient Spontanous Breathing  Post-op Assessment: Report given to RN and Post -op Vital signs reviewed and stable  Post vital signs: Reviewed and stable  Last Vitals: There were no vitals filed for this visit.  Complications: No apparent anesthesia complications

## 2014-10-09 NOTE — Progress Notes (Signed)
Dr Ola Spurr called and informed of cbg of 36, new orders noted.

## 2014-10-09 NOTE — Op Note (Signed)
    Patient name: Austin Kramer MRN: [id number] DOB: 02/09/1961 Sex: male  10/09/2014 Pre-operative Diagnosis: End-stage renal disease Post-operative diagnosis:  Same Surgeon:  Annamarie Major Assistants:  Silva Bandy Procedure:   Left radiocephalic fistula Anesthesia:  Mac Blood Loss:  See anesthesia record Specimens:  None  Findings:  Artery was healthy approximately 2.5 mm.  The vein easily accepted a 3 mm dilator  Indications:  Patient is here for dialysis access.  He is not yet on renal replacement therapy  Procedure:  The patient was identified in the holding area and taken to [place] [facility]  The patient was then placed supine on the table. MAC anesthesia was administered.  The patient was prepped and draped in the usual sterile fashion.  A time out was called and antibiotics were administultrasound was used to evaluate the cephalic vein in the forearm.  It appeared to be acceptable.  1% lidocaine was used for local anesthesia.  A longitudinal incision was made just proximal to the wrist.  Through this incision the cephalic vein was identified and encircled with a blue vessel loop.  It was mobilized proximally and distally.  Side branches were ligated between 3-0 silk ties and metal clips.  The vein was then marked for orientation.  Attention was then turned towards the radial artery which was dissected out.  This was a disease-free artery measuring approximately 2.5 mm.  3000 units heparin were administered.  The heparin had circulated, the vein was occluded distally and ligated.  It was distended with heparinized saline.  It easily accepted a 3 dilator.  The radial artery was then occluded.  A #11 blade was used to make an arteriotomy which was extended longitudinally with Potts scissors.  The vein was cut the appropriate length and then spatulated to fit the size of the arteriotomy.  A running anastomosis was created with 6-0 Prolene.  Prior to completion the appropriate flushing maneuvers  were performed and the anastomosis was completed.  There was a good thrill within the fistula which was confirmed by Doppler evaluation.  The patient did have a somewhat dampened radial artery signal which augmented with fistula compression.  The patient had a triphasic ulnar artery signal.  25 mg of protamine was a Company secretary.  The incision was closed in 2 layers of Vicryls following hemostasis.  Dermabond was applied.   Disposition:  To PACU in stable condition.   Theotis Burrow, M.D. [facility] Office: [phone] Pager:  [phone]

## 2014-10-09 NOTE — Anesthesia Postprocedure Evaluation (Signed)
  Anesthesia Post-op Note  Patient: Austin Kramer  Procedure(s) Performed: Procedure(s): CREATION OF LEFT RADIOCEPHALIC ARTERIOVENOUS (AV) FISTULA  (Left)  Patient Location: PACU  Anesthesia Type:MAC  Level of Consciousness: awake, alert  and oriented  Airway and Oxygen Therapy: Patient Spontanous Breathing  Post-op Pain: none  Post-op Assessment: Post-op Vital signs reviewed              Post-op Vital Signs: Reviewed  Last Vitals:  Filed Vitals:   10/09/14 1255  BP: 174/90  Pulse: 73  Temp:   Resp: 15    Complications: No apparent anesthesia complications

## 2014-10-09 NOTE — Progress Notes (Signed)
Report given to elise nelson rn as caregiver

## 2014-10-09 NOTE — Progress Notes (Signed)
CBG 69 I stat also shows 18 Dr Ola Spurr informed no new orders noted.

## 2014-10-09 NOTE — H&P (Signed)
Patient name: Austin Guardado BurroughMRN: [id number] DOB: 10-09-1962Sex: male   Referred by: Dr. Justin Mend  Reason for referral:  Chief Complaint  Patient presents with  . New Evaluation    eval for access     HISTORY OF PRESENT ILLNESS: this is a 54 year old gentleman who comes in today for evaluation of chronic renal insufficiency, stage IV. He is right-handed. His renal failure secondary to diabetes and hypertension. He reports having a difficult time getting his diabetes under control, however now he is very proud that it is getting better. His most recent hemoglobin A1c was 7.0. The patient's hypertension has been difficult to control. It fluctuates from day to day and is moderately or intermittently controlled. The patient is a nonsmoker.  Past Medical History  Diagnosis Date  . Diabetes mellitus   . ED (erectile dysfunction)   . Obesity   . Cancer     PROSTATE  . PPD positive, treated   . Hypertension     [name]  . Neuromuscular disorder     neuropathy, "tingling in feet"  . GERD (gastroesophageal reflux disease)     occasional    Past Surgical History  Procedure Laterality Date  . Vasectomy    . Prostatectomy  2011  . Knee cartilage surgery    . Amputation  01/26/2012    Procedure: AMPUTATION RAY; Surgeon: Newt Minion, MD; Location: [facility]; Service: Orthopedics; Laterality: Right; Right foot 2nd ray amputation  . Colonoscopy    . Pars plana vitrectomy Left 12/17/2012    Procedure: LEFT PARS PLANA VITRECTOMY WITH 25 GAUGE/ENDO LASER; Surgeon: Hurman Horn, MD; Location: [facility]; Service: Ophthalmology; Laterality: Left;  . Photocoagulation with laser Left 12/17/2012    Procedure: PHOTOCOAGULATION WITH LASER; Surgeon: Hurman Horn, MD; Location: [facility]; Service: Ophthalmology; Laterality: Left;    History   Social History   . Marital Status: Married    Spouse Name: N/A  . Number of Children: N/A  . Years of Education: N/A   Occupational History  . Not on file.   Social History Main Topics  . Smoking status: Never Smoker   . Smokeless tobacco: Never Used  . Alcohol Use: No  . Drug Use: No  . Sexual Activity: Yes   Other Topics Concern  . Not on file   Social History Narrative    Family History  Problem Relation Age of Onset  . Diabetes Mother   . Diabetes Father     Allergies as of 06/22/2014  . (No Known Allergies)    Current Outpatient Prescriptions on File Prior to Visit  Medication Sig Dispense Refill  . amLODipine (NORVASC) 10 MG tablet Take 10 mg by mouth daily.    . ferrous sulfate 325 (65 FE) MG tablet Take 325 mg by mouth daily with breakfast.    . fish oil-omega-3 fatty acids 1000 MG capsule Take 1 g by mouth daily.    . hydrochlorothiazide (HYDRODIURIL) 25 MG tablet Take 25 mg by mouth daily.    . insulin detemir (LEVEMIR) 100 UNIT/ML injection Inject 0.5 mLs (50 Units total) into the skin 2 (two) times daily. 20 mL 0  . insulin lispro (HUMALOG) 100 UNIT/ML injection Inject 20-25 Units into the skin 2 (two) times daily. For low blood sugar as needed. Sliding scale 20 mL 0  . Multiple Vitamin (MULTIVITAMIN WITH MINERALS) TABS Take 1 tablet by mouth daily.    . rosuvastatin (CRESTOR) 20 MG tablet Take 1 tablet (20  mg total) by mouth daily. 49 tablet 0   No current facility-administered medications on file prior to visit.     REVIEW OF SYSTEMS: Cardiovascular: No chest pain, chest pressure, palpitations,No claudication or rest pain, No history of DVT or phlebitis. Positive for shortness of breath with exertion, leg swelling and varicose veins Pulmonary: No productive cough, asthma or wheezing. Neurologic: No weakness, paresthesias, aphasia, or amaurosis. No  dizziness. Hematologic: No bleeding problems or clotting disorders. Musculoskeletal: No joint pain or joint swelling. Gastrointestinal: No blood in stool or hematemesis Genitourinary: No dysuria or hematuria. Psychiatric:: No history of major depression. Integumentary: No rashes or ulcers. Constitutional: No fever or chills.  PHYSICAL EXAMINATION:  Filed Vitals:   06/22/14 1603  BP: 176/105  Pulse: 98  Height: 6' (1.829 m)  Weight: 291 lb 4.8 oz (132.133 kg)  SpO2: 98%   Body mass index is 39.5 kg/(m^2). General: The patient appears their stated age.  HEENT: No gross abnormalities Pulmonary: Respirations are non-labored Musculoskeletal: There are no major deformities.  Neurologic: No focal weakness or paresthesias are detected, Skin: There are no ulcer or rashes noted. Psychiatric: The patient has normal affect. Cardiovascular: There is a regular rate and rhythm without significant murmur appreciated. Palpable left radial pulse. No carotid bruits  Diagnostic Studies: duplex studies have been reviewed. He has triphasic arterial blood flow bilaterally with a normal Allen test.   I have reviewed his vein mapping. He has an excellent basilic vein above the antecubital crease. The cephalic vein from the wrist to the elbow is a nice size vein. The cephalic vein drains into the basilic vein at the elbow.   Assessment:  stage IV renal insufficiency Plan: we have reviewed his vein mapping. I feel the best option is a left radiocephalic vein. The cephalic vein in the upper arm does not appear to be continuous with the lower arm cephalic vein which is of adequate size. I discussed the risks and benefits of the procedure including the risk of steal syndrome as well as the Risk of non-maturity requiring additional interventions. The patient is getting ready to take a long vacation and would like to get this scheduled for July.     Eldridge Abrahams,  M.D. [facility] Office: [phone] Pager: [phone]       No interval changes

## 2014-10-09 NOTE — Anesthesia Preprocedure Evaluation (Addendum)
Anesthesia Evaluation  Patient identified by MRN, date of birth, ID band Patient awake    Reviewed: Allergy & Precautions, NPO status , Patient's Chart, lab work & pertinent test results  Airway Mallampati: II  TM Distance: >3 FB Neck ROM: Full    Dental  (+) Teeth Intact, Dental Advisory Given   Pulmonary neg pulmonary ROS,  breath sounds clear to auscultation        Cardiovascular hypertension, Pt. on medications Rhythm:Regular Rate:Normal     Neuro/Psych negative neurological ROS     GI/Hepatic Neg liver ROS, GERD-  ,  Endo/Other  diabetes, Type 2, Insulin DependentMorbid obesity  Renal/GU CRFRenal disease     Musculoskeletal   Abdominal   Peds  Hematology   Anesthesia Other Findings   Reproductive/Obstetrics                           Anesthesia Physical Anesthesia Plan  ASA: III  Anesthesia Plan: MAC   Post-op Pain Management:    Induction: Intravenous  Airway Management Planned: Natural Airway and Simple Face Mask  Additional Equipment:   Intra-op Plan:   Post-operative Plan:   Informed Consent: I have reviewed the patients History and Physical, chart, labs and discussed the procedure including the risks, benefits and alternatives for the proposed anesthesia with the patient or authorized representative who has indicated his/her understanding and acceptance.     Plan Discussed with: CRNA  Anesthesia Plan Comments:         Anesthesia Quick Evaluation

## 2014-10-12 ENCOUNTER — Encounter (HOSPITAL_COMMUNITY): Payer: Self-pay | Admitting: Surgery

## 2014-10-16 ENCOUNTER — Telehealth: Payer: Self-pay | Admitting: Surgery

## 2014-10-16 NOTE — Telephone Encounter (Addendum)
-----   Message from Mena Goes, RN sent at 10/09/2014  2:54 PM EDT ----- Regarding: Schedule   ----- Message -----    From: Alvia Grove, PA-C    Sent: 10/09/2014  11:37 AM      To: Vvs Charge Pool  S/p left radiocephalic AVF [id number]  F/u with Dr. Trula Slade in 6 weeks with duplex  Thanks Maudie Mercury   notified patient of post op appt. on 11-30-14 lab at 3pm; then with dr. Trula Slade at [facility]

## 2014-11-11 ENCOUNTER — Telehealth: Payer: Self-pay

## 2014-11-11 DIAGNOSIS — G8918 Other acute postprocedural pain: Secondary | ICD-10-CM

## 2014-11-11 MED ORDER — OXYCODONE-ACETAMINOPHEN 5-325 MG PO TABS: 1.0000 | ORAL_TABLET | Freq: Four times a day (QID) | ORAL | Status: DC | PRN

## 2014-11-11 NOTE — Telephone Encounter (Signed)
Phone call from pt.  Requested refill on his pain medication.  Reported most of the pain is at the wrist; stated the wrist area is stiff in the morning, when he walks   Reported some difficulty with gripping, and intermittent numbness in the little finger, of the left hand.  Denied any numbness in the hand, or color change in the left hand or fingers.  Stated he has been exercising his hand/fingers with a ball.  Denied any redness, inflammation, or drainage of incision.  Denied fever.  Stated "it looks like it is healing."  Discussed with Dr. Scot Dock.  Gave verbal okay to refill Percocet 5/325 mg 1 tab po, q 6 hrs/ prn; #20; no RF.  Stated if pt's pain not improving over the next week, he needs to make appt. for evaluation.  Phone call to pt.  Advised of Dr. Nicole Cella recommendation.  Advised he can pick up Rx at office to take to his pharmacy.  Verb. Understanding.

## 2014-11-16 ENCOUNTER — Encounter: Payer: Self-pay | Admitting: Family Medicine

## 2014-11-26 ENCOUNTER — Encounter: Payer: Self-pay | Admitting: Surgery

## 2014-11-30 ENCOUNTER — Ambulatory Visit (HOSPITAL_COMMUNITY)
Admission: RE | Admit: 2014-11-30 | Discharge: 2014-11-30 | Disposition: A | Discharge status: Home / Self Care | Payer: BLUE CROSS/BLUE SHIELD | Source: Ambulatory Visit | Attending: Surgery | Admitting: Surgery

## 2014-11-30 ENCOUNTER — Ambulatory Visit (INDEPENDENT_AMBULATORY_CARE_PROVIDER_SITE_OTHER): Payer: Self-pay | Admitting: Surgery

## 2014-11-30 ENCOUNTER — Encounter: Payer: Self-pay | Admitting: Surgery

## 2014-11-30 VITALS — BP 179/103 | HR 112 | Temp 98.9°F | Resp 18 | Ht 72.0 in | Wt 293.0 lb

## 2014-11-30 DIAGNOSIS — N186 End stage renal disease: Secondary | ICD-10-CM | POA: Diagnosis not present

## 2014-11-30 DIAGNOSIS — Z4931 Encounter for adequacy testing for hemodialysis: Secondary | ICD-10-CM

## 2014-11-30 DIAGNOSIS — N184 Chronic kidney disease, stage 4 (severe): Secondary | ICD-10-CM

## 2014-11-30 NOTE — Progress Notes (Signed)
  POST OPERATIVE OFFICE NOTE    CC:  F/u for surgery  HPI:  This is a 54 y.o. male who is s/p left radiocephalic AVF on [id number].  He returns today for f/u.  He has done quite well.  He denies any steal symptoms.   No Known Allergies  Current Outpatient Prescriptions  Medication Sig Dispense Refill  . amLODipine (NORVASC) 10 MG tablet Take 10 mg by mouth daily.    Marland Kitchen aspirin 81 MG tablet Take 81 mg by mouth daily.    . ferrous sulfate 325 (65 FE) MG tablet Take 325 mg by mouth daily with breakfast.    . fish oil-omega-3 fatty acids 1000 MG capsule Take 1 g by mouth daily.    . hydrochlorothiazide (HYDRODIURIL) 25 MG tablet Take 25 mg by mouth daily.    . insulin detemir (LEVEMIR) 100 UNIT/ML injection Inject 0.5 mLs (50 Units total) into the skin 2 (two) times daily. 20 mL 0  . insulin lispro (HUMALOG) 100 UNIT/ML injection Inject 20-25 Units into the skin 2 (two) times daily. For low blood sugar as needed. Sliding scale 20 mL 0  . Multiple Vitamin (MULTIVITAMIN WITH MINERALS) TABS Take 1 tablet by mouth daily.    Marland Kitchen oxyCODONE-acetaminophen (ROXICET) 5-325 MG tablet Take 1 tablet by mouth every 6 (six) hours as needed for severe pain. 20 tablet 0  . rosuvastatin (CRESTOR) 20 MG tablet Take 1 tablet (20 mg total) by mouth daily. 90 tablet 3   No current facility-administered medications for this visit.     ROS:  See HPI  Physical Exam:  Filed Vitals:   11/30/14 1614  BP: 179/103  Pulse: 112  Temp:   Resp:     Incision:  Well healed Extremities:  +thrill throughout fistula to just proximal to the antecubital space.  Sensation / Motor of left hand are in tact.   Assessment/Plan:  This is a 54 y.o. male who is s/p: Left radial-cephalic AVF on [id number].  -pt doing well without steal symptoms -he does have a good thrill throughout his fistula to just distal to the antecubital space.  On duplex, he does have a large branch that is present. -given the fact that he is not on dialysis,  we will give the fistula more time to mature and check a duplex in 2 months. -at that time, if fistula hasn't matured, he may need a fistulogram at that time. -he may return to full duties at work next week 12/11/14.   Leontine Locket, PA-C [facility] [phone]  Clinic MD:  Pt seen and examined with Dr. Trula Slade  I agree with the above.  I have seen and evaluated the patient.  On examination the patient has a very good thrill up to the mid forearm.  His duplex ultrasound shows areas of the vein which are of adequate diameter and some that are not.  There is also a branch in the upper arm.  Given that the patient is not yet on dialysis, I would like to give him several more months to see if this fistula will mature.  I'm and have come back in 2 months with a repeat duplex of his fistula.  If at that time it has not matured, I will determine whether or not to proceed with a fistulogram or branch ligation.  Annamarie Major

## 2014-11-30 NOTE — Addendum Note (Signed)
Addended by: Dorthula Rue L on: 11/30/2014 05:34 PM   Modules accepted: Orders

## 2014-11-30 NOTE — Progress Notes (Signed)
Filed Vitals:   11/30/14 1609 11/30/14 1614  BP: 193/111 179/103  Pulse: 110 112  Temp: 98.9 F (37.2 C)   TempSrc: Oral   Resp: 18   Height: 6' (1.829 m)   Weight: 293 lb (132.904 kg)   SpO2: 99%

## 2015-02-11 ENCOUNTER — Encounter: Payer: Self-pay | Admitting: Surgery

## 2015-02-16 ENCOUNTER — Ambulatory Visit (HOSPITAL_COMMUNITY)
Admission: RE | Admit: 2015-02-16 | Discharge: 2015-02-16 | Disposition: A | Discharge status: Home / Self Care | Payer: BLUE CROSS/BLUE SHIELD | Source: Ambulatory Visit | Attending: Vascular Surgery | Admitting: Vascular Surgery

## 2015-02-16 DIAGNOSIS — E1122 Type 2 diabetes mellitus with diabetic chronic kidney disease: Secondary | ICD-10-CM | POA: Insufficient documentation

## 2015-02-16 DIAGNOSIS — N184 Chronic kidney disease, stage 4 (severe): Secondary | ICD-10-CM | POA: Diagnosis not present

## 2015-02-16 DIAGNOSIS — I129 Hypertensive chronic kidney disease with stage 1 through stage 4 chronic kidney disease, or unspecified chronic kidney disease: Secondary | ICD-10-CM | POA: Insufficient documentation

## 2015-02-19 ENCOUNTER — Encounter: Payer: Self-pay | Admitting: Surgery

## 2015-02-22 ENCOUNTER — Ambulatory Visit: Payer: BLUE CROSS/BLUE SHIELD | Admitting: Surgery

## 2015-02-23 LAB — HM DIABETES EYE EXAM

## 2015-03-01 ENCOUNTER — Encounter: Payer: Self-pay | Admitting: Surgery

## 2015-03-01 ENCOUNTER — Ambulatory Visit (INDEPENDENT_AMBULATORY_CARE_PROVIDER_SITE_OTHER): Payer: BLUE CROSS/BLUE SHIELD | Admitting: Surgery

## 2015-03-01 VITALS — BP 156/80 | HR 105 | Temp 98.7°F | Resp 16 | Ht 72.0 in | Wt 301.0 lb

## 2015-03-01 DIAGNOSIS — N184 Chronic kidney disease, stage 4 (severe): Secondary | ICD-10-CM

## 2015-03-01 NOTE — Progress Notes (Signed)
Filed Vitals:   03/01/15 1557 03/01/15 1604  BP: 173/90 156/80  Pulse: 106 105  Temp: 98.7 F (37.1 C)   TempSrc: Oral   Resp: 16   Height: 6' (1.829 m)   Weight: 301 lb (136.533 kg)   SpO2: 100%

## 2015-03-01 NOTE — Progress Notes (Signed)
Patient name: Austin Kramer MRN: [id number] DOB: [birth year] Sex: male     Chief Complaint  Patient presents with  . Re-evaluation    CKD  f/u S/P    Left Radiocephalic Fistula [id number]  Pt needs U/S  lab results of  02-16-15    HISTORY OF PRESENT ILLNESS: The patient is back for follow-up.  He initially underwent left radiocephalic fistula creation on 10/09/2014.  At his 6 week follow-up, I did not feel his vein was of adequate size by duplex.  Since he is not yet on dialysis, I elected to wait another few months and reimage him.  He is back today without complaints.  He is not on dialysis.  He is trying to lose weight and eat healthy as well as exercise.  Past Medical History  Diagnosis Date  . Diabetes mellitus   . ED (erectile dysfunction)   . Obesity   . 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 PPD positive, treated   . Hypertension     [name]  . Neuromuscular disorder (HCC)     neuropathy, "tingling in feet"  . GERD (gastroesophageal reflux disease)     occasional  . Chronic kidney disease     Past Surgical History  Procedure Laterality Date  . Vasectomy    . Prostatectomy  2011  . Knee cartilage surgery    . Amputation  01/26/2012    Procedure: AMPUTATION RAY;  Surgeon: Newt Minion, MD;  Location: [facility];  Service: Orthopedics;  Laterality: Right;  Right foot 2nd ray amputation  . Colonoscopy    . Pars plana vitrectomy Left 12/17/2012    Procedure: LEFT PARS PLANA VITRECTOMY WITH 25 GAUGE/ENDO LASER;  Surgeon: Hurman Horn, MD;  Location: [facility];  Service: Ophthalmology;  Laterality: Left;  . Photocoagulation with laser Left 12/17/2012    Procedure: PHOTOCOAGULATION WITH LASER;  Surgeon: Hurman Horn, MD;  Location: [facility];  Service: Ophthalmology;  Laterality: Left;  . Fracture surgery      shoulder surgery  . Av fistula placement Left 10/09/2014    Procedure: CREATION OF LEFT RADIOCEPHALIC ARTERIOVENOUS (AV) FISTULA ;  Surgeon: Serafina Mitchell, MD;   Location[facility];  Service: Vascular;  Laterality: Left;    Social History   Social History  . Marital Status: Married    Spouse Name: N/A  . Number of Children: N/A  . Years of Education: N/A   Occupational History  . Not on file.   Social History Main Topics  . Smoking status: Never Smoker   . Smokeless tobacco: Never Used  . Alcohol Use: No  . Drug Use: No  . Sexual Activity: Yes   Other Topics Concern  . Not on file   Social History Narrative    Family History  Problem Relation Age of Onset  . Diabetes Mother   . Diabetes Father     Allergies as of 03/01/2015  . (No Known Allergies)    Current Outpatient Prescriptions on File Prior to Visit  Medication Sig Dispense Refill  . amLODipine (NORVASC) 10 MG tablet Take 10 mg by mouth daily.    Marland Kitchen aspirin 81 MG tablet Take 81 mg by mouth daily.    . ferrous sulfate 325 (65 FE) MG tablet Take 325 mg by mouth daily with breakfast.    . fish oil-omega-3 fatty acids 1000 MG capsule Take 1 g by mouth daily.    . hydrochlorothiazide (HYDRODIURIL)  25 MG tablet Take 25 mg by mouth daily.    . insulin detemir (LEVEMIR) 100 UNIT/ML injection Inject 0.5 mLs (50 Units total) into the skin 2 (two) times daily. 20 mL 0  . insulin lispro (HUMALOG) 100 UNIT/ML injection Inject 20-25 Units into the skin 2 (two) times daily. For low blood sugar as needed. Sliding scale 20 mL 0  . Multiple Vitamin (MULTIVITAMIN WITH MINERALS) TABS Take 1 tablet by mouth daily.    . rosuvastatin (CRESTOR) 20 MG tablet Take 1 tablet (20 mg total) by mouth daily. 90 tablet 3  . oxyCODONE-acetaminophen (ROXICET) 5-325 MG tablet Take 1 tablet by mouth every 6 (six) hours as needed for severe pain. (Patient not taking: Reported on 03/01/2015) 20 tablet 0   No current facility-administered medications on file prior to visit.     REVIEW OF SYSTEMS: No change from visit 2 months ago  PHYSICAL EXAMINATION:   Vital signs are  Filed Vitals:   03/01/15 1557  03/01/15 1604  BP: 173/90 156/80  Pulse: 106 105  Temp: 98.7 F (37.1 C)   TempSrc: Oral   Resp: 16   Height: 6' (1.829 m)   Weight: 301 lb (136.533 kg)   SpO2: 100%    Body mass index is 40.81 kg/(m^2). General: The patient appears their stated age. HEENT:  No gross abnormalities Pulmonary:  Non labored breathing Musculoskeletal: There are no major deformities. Neurologic: No focal weakness or paresthesias are detected, Skin: There are no ulcer or rashes noted. Psychiatric: The patient has normal affect. Cardiovascular: strongly palpable thrill within the left radiocephalic fistula from the wrist to the antecubitum   Diagnostic Studies I have ordered and reviewed his ultrasound.  Today the duplex shows that all diameters of the fistula are greater than 0.5 cm.  All depths are less than 0.5 cm.  Assessment: Chronic renal insufficiency Plan: The patient has an excellent thrill within his fistula as well as a duplex which shows this to be widely patent.  I feel that it is now ready for use, should he require dialysis  V. Leia Alf, M.D. [facility] Office: [phone] Pager:  [phone]

## 2015-03-24 ENCOUNTER — Encounter: Payer: Self-pay | Admitting: *Deleted

## 2015-07-29 DIAGNOSIS — N184 Chronic kidney disease, stage 4 (severe): Secondary | ICD-10-CM | POA: Diagnosis not present

## 2015-07-29 DIAGNOSIS — E7800 Pure hypercholesterolemia, unspecified: Secondary | ICD-10-CM | POA: Diagnosis not present

## 2015-07-29 DIAGNOSIS — N2581 Secondary hyperparathyroidism of renal origin: Secondary | ICD-10-CM | POA: Diagnosis not present

## 2015-07-29 DIAGNOSIS — I10 Essential (primary) hypertension: Secondary | ICD-10-CM | POA: Diagnosis not present

## 2015-07-29 DIAGNOSIS — R7989 Other specified abnormal findings of blood chemistry: Secondary | ICD-10-CM | POA: Diagnosis not present

## 2015-07-29 DIAGNOSIS — E11319 Type 2 diabetes mellitus with unspecified diabetic retinopathy without macular edema: Secondary | ICD-10-CM | POA: Diagnosis not present

## 2015-07-29 DIAGNOSIS — E1122 Type 2 diabetes mellitus with diabetic chronic kidney disease: Secondary | ICD-10-CM | POA: Diagnosis not present

## 2015-07-29 DIAGNOSIS — I129 Hypertensive chronic kidney disease with stage 1 through stage 4 chronic kidney disease, or unspecified chronic kidney disease: Secondary | ICD-10-CM | POA: Diagnosis not present

## 2015-07-29 DIAGNOSIS — E1165 Type 2 diabetes mellitus with hyperglycemia: Secondary | ICD-10-CM | POA: Diagnosis not present

## 2015-07-29 DIAGNOSIS — G609 Hereditary and idiopathic neuropathy, unspecified: Secondary | ICD-10-CM | POA: Diagnosis not present

## 2015-08-03 ENCOUNTER — Other Ambulatory Visit (HOSPITAL_COMMUNITY): Payer: Self-pay | Admitting: *Deleted

## 2015-08-04 ENCOUNTER — Encounter (HOSPITAL_COMMUNITY): Payer: BLUE CROSS/BLUE SHIELD

## 2015-09-07 DIAGNOSIS — R7989 Other specified abnormal findings of blood chemistry: Secondary | ICD-10-CM | POA: Diagnosis not present

## 2015-09-07 DIAGNOSIS — E1122 Type 2 diabetes mellitus with diabetic chronic kidney disease: Secondary | ICD-10-CM | POA: Diagnosis not present

## 2015-09-07 DIAGNOSIS — N2581 Secondary hyperparathyroidism of renal origin: Secondary | ICD-10-CM | POA: Diagnosis not present

## 2015-09-07 DIAGNOSIS — N184 Chronic kidney disease, stage 4 (severe): Secondary | ICD-10-CM | POA: Diagnosis not present

## 2015-09-07 DIAGNOSIS — I129 Hypertensive chronic kidney disease with stage 1 through stage 4 chronic kidney disease, or unspecified chronic kidney disease: Secondary | ICD-10-CM | POA: Diagnosis not present

## 2015-09-21 ENCOUNTER — Encounter (HOSPITAL_COMMUNITY)
Admission: RE | Admit: 2015-09-21 | Discharge: 2015-09-21 | Disposition: A | Discharge status: Home / Self Care | Payer: BLUE CROSS/BLUE SHIELD | Source: Ambulatory Visit | Attending: Nephrology | Admitting: Nephrology

## 2015-09-21 DIAGNOSIS — N19 Unspecified kidney failure: Secondary | ICD-10-CM | POA: Diagnosis not present

## 2015-09-21 LAB — POCT HEMOGLOBIN-HEMACUE: HEMOGLOBIN: 7.8 g/dL — AB (ref 13.0–17.0)

## 2015-09-21 MED ORDER — CLONIDINE HCL 0.1 MG PO TABS: 0.1000 mg | ORAL_TABLET | Freq: Once | ORAL | Status: DC | PRN

## 2015-09-21 MED ORDER — DARBEPOETIN ALFA 60 MCG/0.3ML IJ SOSY
60.0000 ug | PREFILLED_SYRINGE | INTRAMUSCULAR | Status: DC
  Administered 2015-09-21: 60 ug via SUBCUTANEOUS

## 2015-09-21 MED ORDER — DARBEPOETIN ALFA 60 MCG/0.3ML IJ SOSY
PREFILLED_SYRINGE | INTRAMUSCULAR | Status: AC
  Administered 2015-09-21: 60 ug via SUBCUTANEOUS
  Filled 2015-09-21: qty 0.3

## 2015-09-21 NOTE — Discharge Instructions (Signed)
Darbepoetin Alfa injection What is this medicine? DARBEPOETIN ALFA (dar be POE e tin AL fa) helps your body make more red blood cells. It is used to treat anemia caused by chronic kidney failure and chemotherapy. This medicine may be used for other purposes; ask your health care provider or pharmacist if you have questions. What should I tell my health care provider before I take this medicine? They need to know if you have any of these conditions: -blood clotting disorders or history of blood clots -cancer patient not on chemotherapy -cystic fibrosis -heart disease, such as angina, heart failure, or a history of a heart attack -hemoglobin level of 12 g/dL or greater -high blood pressure -low levels of folate, iron, or vitamin B12 -seizures -an unusual or allergic reaction to darbepoetin, erythropoietin, albumin, hamster proteins, latex, other medicines, foods, dyes, or preservatives -pregnant or trying to get pregnant -breast-feeding How should I use this medicine? This medicine is for injection into a vein or under the skin. It is usually given by a health care professional in a hospital or clinic setting. If you get this medicine at home, you will be taught how to prepare and give this medicine. Do not shake the solution before you withdraw a dose. Use exactly as directed. Take your medicine at regular intervals. Do not take your medicine more often than directed. It is important that you put your used needles and syringes in a special sharps container. Do not put them in a trash can. If you do not have a sharps container, call your pharmacist or healthcare provider to get one. Talk to your pediatrician regarding the use of this medicine in children. While this medicine may be used in children as young as 1 year for selected conditions, precautions do apply. Overdosage: If you think you have taken too much of this medicine contact a poison control center or emergency room at once. NOTE:  This medicine is only for you. Do not share this medicine with others. What if I miss a dose? If you miss a dose, take it as soon as you can. If it is almost time for your next dose, take only that dose. Do not take double or extra doses. What may interact with this medicine? Do not take this medicine with any of the following medications: -epoetin alfa This list may not describe all possible interactions. Give your health care provider a list of all the medicines, herbs, non-prescription drugs, or dietary supplements you use. Also tell them if you smoke, drink alcohol, or use illegal drugs. Some items may interact with your medicine. What should I watch for while using this medicine? Visit your prescriber or health care professional for regular checks on your progress and for the needed blood tests and blood pressure measurements. It is especially important for the doctor to make sure your hemoglobin level is in the desired range, to limit the risk of potential side effects and to give you the best benefit. Keep all appointments for any recommended tests. Check your blood pressure as directed. Ask your doctor what your blood pressure should be and when you should contact him or her. As your body makes more red blood cells, you may need to take iron, folic acid, or vitamin B supplements. Ask your doctor or health care provider which products are right for you. If you have kidney disease continue dietary restrictions, even though this medication can make you feel better. Talk with your doctor or health care professional about the   foods you eat and the vitamins that you take. What side effects may I notice from receiving this medicine? Side effects that you should report to your doctor or health care professional as soon as possible: -allergic reactions like skin rash, itching or hives, swelling of the face, lips, or tongue -breathing problems -changes in vision -chest pain -confusion, trouble speaking  or understanding -feeling faint or lightheaded, falls -high blood pressure -muscle aches or pains -pain, swelling, warmth in the leg -rapid weight gain -severe headaches -sudden numbness or weakness of the face, arm or leg -trouble walking, dizziness, loss of balance or coordination -seizures (convulsions) -swelling of the ankles, feet, hands -unusually weak or tired Side effects that usually do not require medical attention (report to your doctor or health care professional if they continue or are bothersome): -diarrhea -fever, chills (flu-like symptoms) -headaches -nausea, vomiting -redness, stinging, or swelling at site where injected This list may not describe all possible side effects. Call your doctor for medical advice about side effects. You may report side effects to FDA at 1-800-FDA-1088. Where should I keep my medicine? Keep out of the reach of children. Store in a refrigerator between 2 and 8 degrees C (36 and 46 degrees F). Do not freeze. Do not shake. Throw away any unused portion if using a single-dose vial. Throw away any unused medicine after the expiration date. NOTE: This sheet is a summary. It may not cover all possible information. If you have questions about this medicine, talk to your doctor, pharmacist, or health care provider.    2016, Elsevier/Gold Standard. (2008-01-14 10:23:57)  

## 2015-10-05 ENCOUNTER — Encounter (HOSPITAL_COMMUNITY)
Admission: RE | Admit: 2015-10-05 | Discharge: 2015-10-05 | Disposition: A | Discharge status: Home / Self Care | Payer: BLUE CROSS/BLUE SHIELD | Source: Ambulatory Visit | Attending: Nephrology | Admitting: Nephrology

## 2015-10-05 DIAGNOSIS — N19 Unspecified kidney failure: Secondary | ICD-10-CM

## 2015-10-05 LAB — POCT HEMOGLOBIN-HEMACUE: HEMOGLOBIN: 8.4 g/dL — AB (ref 13.0–17.0)

## 2015-10-05 MED ORDER — DARBEPOETIN ALFA 60 MCG/0.3ML IJ SOSY
PREFILLED_SYRINGE | INTRAMUSCULAR | Status: AC
Start: 2015-10-05 — End: 2015-10-05
  Administered 2015-10-05: 60 ug via SUBCUTANEOUS
  Filled 2015-10-05: qty 0.3

## 2015-10-05 MED ORDER — DARBEPOETIN ALFA 60 MCG/0.3ML IJ SOSY
60.0000 ug | PREFILLED_SYRINGE | INTRAMUSCULAR | Status: DC
  Administered 2015-10-05: 60 ug via SUBCUTANEOUS

## 2015-10-19 ENCOUNTER — Inpatient Hospital Stay (HOSPITAL_COMMUNITY): Admission: RE | Admit: 2015-10-19 | Payer: BLUE CROSS/BLUE SHIELD | Source: Ambulatory Visit

## 2015-10-26 ENCOUNTER — Encounter (HOSPITAL_COMMUNITY)
Admission: RE | Admit: 2015-10-26 | Discharge: 2015-10-26 | Disposition: A | Discharge status: Home / Self Care | Payer: BLUE CROSS/BLUE SHIELD | Source: Ambulatory Visit | Attending: Nephrology | Admitting: Nephrology

## 2015-10-26 DIAGNOSIS — N19 Unspecified kidney failure: Secondary | ICD-10-CM | POA: Diagnosis not present

## 2015-10-26 LAB — IRON AND TIBC
Iron: 24 ug/dL — ABNORMAL LOW (ref 45–182)
Saturation Ratios: 8 % — ABNORMAL LOW (ref 17.9–39.5)
TIBC: 319 ug/dL (ref 250–450)
UIBC: 295 ug/dL

## 2015-10-26 LAB — POCT HEMOGLOBIN-HEMACUE: Hemoglobin: 9 g/dL — ABNORMAL LOW (ref 13.0–17.0)

## 2015-10-26 LAB — FERRITIN: FERRITIN: 101 ng/mL (ref 24–336)

## 2015-10-26 MED ORDER — DARBEPOETIN ALFA 60 MCG/0.3ML IJ SOSY
PREFILLED_SYRINGE | INTRAMUSCULAR | Status: AC
  Filled 2015-10-26: qty 0.3

## 2015-10-26 MED ORDER — DARBEPOETIN ALFA 60 MCG/0.3ML IJ SOSY
60.0000 ug | PREFILLED_SYRINGE | INTRAMUSCULAR | Status: DC
  Administered 2015-10-26: 60 ug via SUBCUTANEOUS

## 2015-11-09 ENCOUNTER — Other Ambulatory Visit (HOSPITAL_COMMUNITY): Payer: Self-pay | Admitting: *Deleted

## 2015-11-10 ENCOUNTER — Inpatient Hospital Stay (HOSPITAL_COMMUNITY): Admission: RE | Admit: 2015-11-10 | Payer: BLUE CROSS/BLUE SHIELD | Source: Ambulatory Visit

## 2015-11-10 ENCOUNTER — Encounter (HOSPITAL_COMMUNITY)
Admission: RE | Admit: 2015-11-10 | Discharge: 2015-11-10 | Disposition: A | Discharge status: Home / Self Care | Payer: BLUE CROSS/BLUE SHIELD | Source: Ambulatory Visit | Attending: Nephrology | Admitting: Nephrology

## 2015-11-10 DIAGNOSIS — N19 Unspecified kidney failure: Secondary | ICD-10-CM | POA: Diagnosis not present

## 2015-11-10 LAB — POCT HEMOGLOBIN-HEMACUE: HEMOGLOBIN: 8.4 g/dL — AB (ref 13.0–17.0)

## 2015-11-10 MED ORDER — DARBEPOETIN ALFA 60 MCG/0.3ML IJ SOSY
60.0000 ug | PREFILLED_SYRINGE | INTRAMUSCULAR | Status: DC
  Administered 2015-11-10: 60 ug via SUBCUTANEOUS

## 2015-11-10 MED ORDER — SODIUM CHLORIDE 0.9 % IV SOLN
510.0000 mg | INTRAVENOUS | Status: DC
  Administered 2015-11-10: 510 mg via INTRAVENOUS
  Filled 2015-11-10: qty 17

## 2015-11-10 MED ORDER — DARBEPOETIN ALFA 60 MCG/0.3ML IJ SOSY
PREFILLED_SYRINGE | INTRAMUSCULAR | Status: AC
  Filled 2015-11-10: qty 0.3

## 2015-11-17 DIAGNOSIS — I129 Hypertensive chronic kidney disease with stage 1 through stage 4 chronic kidney disease, or unspecified chronic kidney disease: Secondary | ICD-10-CM | POA: Diagnosis not present

## 2015-11-17 DIAGNOSIS — N184 Chronic kidney disease, stage 4 (severe): Secondary | ICD-10-CM | POA: Diagnosis not present

## 2015-11-17 DIAGNOSIS — E1122 Type 2 diabetes mellitus with diabetic chronic kidney disease: Secondary | ICD-10-CM | POA: Diagnosis not present

## 2015-11-17 DIAGNOSIS — N2581 Secondary hyperparathyroidism of renal origin: Secondary | ICD-10-CM | POA: Diagnosis not present

## 2015-11-17 DIAGNOSIS — R7989 Other specified abnormal findings of blood chemistry: Secondary | ICD-10-CM | POA: Diagnosis not present

## 2015-11-25 ENCOUNTER — Ambulatory Visit (INDEPENDENT_AMBULATORY_CARE_PROVIDER_SITE_OTHER): Payer: BLUE CROSS/BLUE SHIELD | Admitting: Family Medicine

## 2015-11-25 ENCOUNTER — Encounter: Payer: Self-pay | Admitting: Family Medicine

## 2015-11-25 ENCOUNTER — Other Ambulatory Visit (HOSPITAL_COMMUNITY): Payer: Self-pay | Admitting: *Deleted

## 2015-11-25 VITALS — BP 150/70 | HR 94 | Temp 97.5°F | Ht 70.0 in | Wt 290.0 lb

## 2015-11-25 DIAGNOSIS — I10 Essential (primary) hypertension: Secondary | ICD-10-CM

## 2015-11-25 DIAGNOSIS — E1122 Type 2 diabetes mellitus with diabetic chronic kidney disease: Secondary | ICD-10-CM | POA: Diagnosis not present

## 2015-11-25 DIAGNOSIS — N184 Chronic kidney disease, stage 4 (severe): Secondary | ICD-10-CM | POA: Diagnosis not present

## 2015-11-25 DIAGNOSIS — R0609 Other forms of dyspnea: Secondary | ICD-10-CM | POA: Diagnosis not present

## 2015-11-25 DIAGNOSIS — R0600 Dyspnea, unspecified: Secondary | ICD-10-CM | POA: Diagnosis not present

## 2015-11-25 DIAGNOSIS — E1159 Type 2 diabetes mellitus with other circulatory complications: Secondary | ICD-10-CM

## 2015-11-25 DIAGNOSIS — Z794 Long term (current) use of insulin: Secondary | ICD-10-CM | POA: Diagnosis not present

## 2015-11-25 DIAGNOSIS — E785 Hyperlipidemia, unspecified: Secondary | ICD-10-CM

## 2015-11-25 LAB — COMPREHENSIVE METABOLIC PANEL
ALK PHOS: 60 U/L (ref 40–115)
ALT: 14 U/L (ref 9–46)
AST: 19 U/L (ref 10–35)
Albumin: 3.2 g/dL — ABNORMAL LOW (ref 3.6–5.1)
BILIRUBIN TOTAL: 0.3 mg/dL (ref 0.2–1.2)
BUN: 81 mg/dL — AB (ref 7–25)
CALCIUM: 8.3 mg/dL — AB (ref 8.6–10.3)
CO2: 19 mmol/L — AB (ref 20–31)
Chloride: 105 mmol/L (ref 98–110)
Creat: 7.77 mg/dL — ABNORMAL HIGH (ref 0.70–1.33)
GLUCOSE: 37 mg/dL — AB (ref 65–99)
POTASSIUM: 3.6 mmol/L (ref 3.5–5.3)
Sodium: 137 mmol/L (ref 135–146)
Total Protein: 6.6 g/dL (ref 6.1–8.1)

## 2015-11-25 LAB — LIPID PANEL
CHOL/HDL RATIO: 4.6 ratio (ref <=5.0)
CHOLESTEROL: 171 mg/dL (ref 125–200)
HDL: 37 mg/dL — ABNORMAL LOW (ref >=40)
LDL CALC: 113 mg/dL (ref <130)
Triglycerides: 107 mg/dL (ref <150)
VLDL: 21 mg/dL (ref <30)

## 2015-11-25 LAB — BRAIN NATRIURETIC PEPTIDE: Brain Natriuretic Peptide: 415.6 pg/mL — ABNORMAL HIGH (ref <100)

## 2015-11-25 NOTE — Progress Notes (Addendum)
   Subjective:    Patient ID: Austin Kramer, male    DOB: 11-04-1960, 55 y.o.   MRN: [id number]  HPI He is here for evaluation of a six-month history of difficulty with cough and wheezing. He states that when he lays down, he will start coughing and wheezing and sitting up makes this go away. He also notes shortness of breath with minimal physical activity including sexual activity and walking up one flight of steps. He has also noted peripheral edema and does get better when he lies down. He also says that he has had 5 episodes in the last month of coughing up blood. No sore throat, fever, chest pain or tightness. He states that he has not mentioned this to Dr. Justin Mend or Dr. Gaylene Brooks.   Review of Systems     Objective:   Physical Exam Alert and in no distress. Tympanic membranes and canals are normal. Pharyngeal area is normal. Neck is supple without adenopathy or thyromegaly. Cardiac exam shows a regular sinus rhythm without murmurs or gallops,Rate is 97. Lungs show bibasilar rales. Lower extremities show 3+ pitting edema. EKG does show nonspecific ST-T changes.       Assessment & Plan:  Type 2 diabetes mellitus with stage 4 chronic kidney disease, with long-term current use of insulin (HCC)  Hypertension associated with diabetes ([place])  Hyperlipidemia LDL goal <70  Dyspnea on exertion  PND (paroxysmal nocturnal dyspnea) He is scheduled for IV iron tomorrow and will get the x-ray at that time. I will order the BNP stat. His symptoms are certainly suggestive of angina. 11/26/15. 9:20 Am The blood work was reviewed and does show evidence of heart failure as well as worsening renal failure. The case was discussed with Dr. Saunders Revel who recommended that he go to the hospital for further evaluation and treatment. With his renal failure him it would be difficult to diurese all of this and he might require dialysis. Also of note is his low blood sugar however he was not symptomatic from it and did  get some lemonade before he left the office. I discussed this with Arzell and he will be going to the hospital now.

## 2015-11-26 ENCOUNTER — Encounter (HOSPITAL_COMMUNITY): Payer: Self-pay

## 2015-11-26 ENCOUNTER — Emergency Department (HOSPITAL_COMMUNITY): Payer: BLUE CROSS/BLUE SHIELD

## 2015-11-26 ENCOUNTER — Ambulatory Visit (HOSPITAL_COMMUNITY): Admission: RE | Admit: 2015-11-26 | Payer: BLUE CROSS/BLUE SHIELD | Source: Ambulatory Visit

## 2015-11-26 ENCOUNTER — Emergency Department (HOSPITAL_COMMUNITY)
Admission: EM | Admit: 2015-11-26 | Discharge: 2015-11-26 | Disposition: A | Discharge status: Home / Self Care | Payer: BLUE CROSS/BLUE SHIELD | Attending: Emergency Medicine | Admitting: Emergency Medicine

## 2015-11-26 DIAGNOSIS — R600 Localized edema: Secondary | ICD-10-CM | POA: Diagnosis not present

## 2015-11-26 DIAGNOSIS — E11319 Type 2 diabetes mellitus with unspecified diabetic retinopathy without macular edema: Secondary | ICD-10-CM | POA: Diagnosis not present

## 2015-11-26 DIAGNOSIS — R0600 Dyspnea, unspecified: Secondary | ICD-10-CM

## 2015-11-26 DIAGNOSIS — Z7982 Long term (current) use of aspirin: Secondary | ICD-10-CM | POA: Diagnosis not present

## 2015-11-26 DIAGNOSIS — E1140 Type 2 diabetes mellitus with diabetic neuropathy, unspecified: Secondary | ICD-10-CM | POA: Diagnosis not present

## 2015-11-26 DIAGNOSIS — R05 Cough: Secondary | ICD-10-CM | POA: Diagnosis not present

## 2015-11-26 DIAGNOSIS — Z794 Long term (current) use of insulin: Secondary | ICD-10-CM | POA: Diagnosis not present

## 2015-11-26 DIAGNOSIS — E1122 Type 2 diabetes mellitus with diabetic chronic kidney disease: Secondary | ICD-10-CM | POA: Insufficient documentation

## 2015-11-26 DIAGNOSIS — R0602 Shortness of breath: Secondary | ICD-10-CM | POA: Diagnosis not present

## 2015-11-26 DIAGNOSIS — Z8546 Personal history of malignant neoplasm of prostate: Secondary | ICD-10-CM | POA: Insufficient documentation

## 2015-11-26 DIAGNOSIS — I129 Hypertensive chronic kidney disease with stage 1 through stage 4 chronic kidney disease, or unspecified chronic kidney disease: Secondary | ICD-10-CM | POA: Insufficient documentation

## 2015-11-26 DIAGNOSIS — N189 Chronic kidney disease, unspecified: Secondary | ICD-10-CM | POA: Insufficient documentation

## 2015-11-26 LAB — I-STAT TROPONIN, ED: Troponin i, poc: 0.07 ng/mL (ref 0.00–0.08)

## 2015-11-26 LAB — BASIC METABOLIC PANEL
Anion gap: 12 (ref 5–15)
BUN: 80 mg/dL — ABNORMAL HIGH (ref 6–20)
CO2: 19 mmol/L — ABNORMAL LOW (ref 22–32)
Calcium: 8.8 mg/dL — ABNORMAL LOW (ref 8.9–10.3)
Chloride: 108 mmol/L (ref 101–111)
Creatinine, Ser: 8.09 mg/dL — ABNORMAL HIGH (ref 0.61–1.24)
GFR calc Af Amer: 8 mL/min — ABNORMAL LOW (ref >60)
GFR calc non Af Amer: 7 mL/min — ABNORMAL LOW (ref >60)
Glucose, Bld: 89 mg/dL (ref 65–99)
Potassium: 4.1 mmol/L (ref 3.5–5.1)
Sodium: 139 mmol/L (ref 135–145)

## 2015-11-26 LAB — CBC
HCT: 29.6 % — ABNORMAL LOW (ref 39.0–52.0)
Hemoglobin: 9.3 g/dL — ABNORMAL LOW (ref 13.0–17.0)
MCH: 22.2 pg — ABNORMAL LOW (ref 26.0–34.0)
MCHC: 31.4 g/dL (ref 30.0–36.0)
MCV: 70.8 fL — ABNORMAL LOW (ref 78.0–100.0)
Platelets: 301 10*3/uL (ref 150–400)
RBC: 4.18 MIL/uL — ABNORMAL LOW (ref 4.22–5.81)
RDW: 20.6 % — ABNORMAL HIGH (ref 11.5–15.5)
WBC: 11.2 10*3/uL — ABNORMAL HIGH (ref 4.0–10.5)

## 2015-11-26 MED ORDER — POTASSIUM CHLORIDE CRYS ER 20 MEQ PO TBCR
40.0000 meq | EXTENDED_RELEASE_TABLET | Freq: Once | ORAL | Status: AC
  Administered 2015-11-26: 40 meq via ORAL
  Filled 2015-11-26: qty 2

## 2015-11-26 MED ORDER — AZITHROMYCIN 250 MG PO TABS: 250.0000 mg | ORAL_TABLET | Freq: Every day | ORAL | 0 refills | Status: DC

## 2015-11-26 MED ORDER — POTASSIUM CHLORIDE CRYS ER 20 MEQ PO TBCR: 20.0000 meq | EXTENDED_RELEASE_TABLET | Freq: Two times a day (BID) | ORAL | 0 refills | Status: DC

## 2015-11-26 MED ORDER — FUROSEMIDE 10 MG/ML IJ SOLN
80.0000 mg | Freq: Once | INTRAMUSCULAR | Status: AC
  Administered 2015-11-26: 80 mg via INTRAVENOUS
  Filled 2015-11-26: qty 8

## 2015-11-26 MED ORDER — AZITHROMYCIN 250 MG PO TABS
500.0000 mg | ORAL_TABLET | Freq: Once | ORAL | Status: AC
  Administered 2015-11-26: 500 mg via ORAL
  Filled 2015-11-26: qty 2

## 2015-11-26 MED ORDER — DEXTROSE 5 % IV SOLN
1.0000 g | Freq: Once | INTRAVENOUS | Status: AC
  Administered 2015-11-26: 1 g via INTRAVENOUS
  Filled 2015-11-26: qty 10

## 2015-11-26 MED ORDER — AMOXICILLIN 500 MG PO CAPS: 500.0000 mg | ORAL_CAPSULE | Freq: Two times a day (BID) | ORAL | 0 refills | Status: DC

## 2015-11-26 MED ORDER — FUROSEMIDE 40 MG PO TABS: 40.0000 mg | ORAL_TABLET | Freq: Two times a day (BID) | ORAL | 0 refills | Status: DC

## 2015-11-26 NOTE — ED Notes (Signed)
Pt's wife at nurse's station asking if pt can eat. Dr. Wilson Singer said the pt could eat a little but not a lot.

## 2015-11-26 NOTE — ED Notes (Signed)
Pt ambulated to bathroom and back to room independently and without difficulty.

## 2015-11-26 NOTE — ED Notes (Signed)
Pulse Ox while ambulating:  99%-100%.

## 2015-11-26 NOTE — ED Provider Notes (Signed)
[facility] DEPT Provider Note   CSN: [id number] Arrival date & time: 11/26/15  1122  By signing my name below, I, Austin Kramer, attest that this documentation has been prepared under the direction and in the presence of Austin Manifold, MD . Electronically Signed: [name], Scribe. 11/26/2015. 5:30 PM.   History   Chief Complaint Chief Complaint  Patient presents with  . Shortness of Breath  . Cough    The history is provided by the patient. No language interpreter was used.    HPI Comments:  Austin Kramer is a 55 y.o. male with a history of HTN and CKD,who presents to the Emergency Department complaining of SOB and productive cough x 6 months with associated congestion. He states he has noticed blood in his sputum a few times. He also notes orthopnea, dyspnea on exertion, and waxing and waning BLE swelling. He was advised to come to the ED by PCP for further evaluation. No alleviating factors noted. He denies CP and fever. He notes his nephrologist recently increased his diuretic dose with moderate improvement.   Austin Kramer- Nephrologist    Past Medical History:  Diagnosis Date  . Cancer ([place])    PROSTATE  . Chronic kidney disease   . Diabetes mellitus   . ED (erectile dysfunction)   . GERD (gastroesophageal reflux disease)    occasional  . Hypertension    sees Dr. Jill Kramer  . Neuromuscular disorder (HCC)    neuropathy, "tingling in feet"  . Obesity   . PPD positive, treated     Patient Active Problem List   Diagnosis Date Noted  . Ureteritis 06/10/2013  . Renal failure 06/10/2013  . Diabetic retinopathy associated with type 2 diabetes mellitus ([place]) 12/17/2012  . 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 Hyponatremia 01/11/2012  . Hypokalemia 01/11/2012  . Renal insufficiency 01/11/2012  . Cellulitis 01/10/2012  . Leukocytosis 01/10/2012  . Diabetes mellitus (Austin Kramer) 03/27/2011  . Hypertension associated with diabetes ([place]) 03/27/2011  . Hyperlipidemia LDL goal <70  03/27/2011  . Obesity (BMI 30-39.9) 03/27/2011    Past Surgical History:  Procedure Laterality Date  . AMPUTATION  01/26/2012   Procedure: AMPUTATION RAY;  Surgeon: Newt Minion, MD;  Location: [facility];  Service: Orthopedics;  Laterality: Right;  Right foot 2nd ray amputation  . AV FISTULA PLACEMENT Left 10/09/2014   Procedure: CREATION OF LEFT RADIOCEPHALIC ARTERIOVENOUS (AV) FISTULA ;  Surgeon: Serafina Mitchell, MD;  Location: [facility];  Service: Vascular;  Laterality: Left;  . COLONOSCOPY    . FRACTURE SURGERY     shoulder surgery  . KNEE CARTILAGE SURGERY    . PARS PLANA VITRECTOMY Left 12/17/2012   Procedure: LEFT PARS PLANA VITRECTOMY WITH 25 GAUGE/ENDO LASER;  Surgeon: Austin Horn, MD;  Location: [facility];  Service: Ophthalmology;  Laterality: Left;  . PHOTOCOAGULATION WITH LASER Left 12/17/2012   Procedure: PHOTOCOAGULATION WITH LASER;  Surgeon: Austin Horn, MD;  Location: [facility];  Service: Ophthalmology;  Laterality: Left;  . PROSTATECTOMY  2011  . VASECTOMY         Home Medications    Prior to Admission medications   Medication Sig Start Date End Date Taking? Authorizing Provider  amLODipine (NORVASC) 10 MG tablet Take 10 mg by mouth daily.    Historical Provider, MD  aspirin 81 MG tablet Take 81 mg by mouth daily.    Historical Provider, MD  ferrous sulfate 325 (65 FE) MG tablet Take 325 mg by mouth daily with breakfast.  Historical Provider, MD  fish oil-omega-3 fatty acids 1000 MG capsule Take 1 g by mouth daily.    Historical Provider, MD  hydrochlorothiazide (HYDRODIURIL) 25 MG tablet Take 25 mg by mouth daily.    Historical Provider, MD  insulin detemir (LEVEMIR) 100 UNIT/ML injection Inject 0.5 mLs (50 Units total) into the skin 2 (two) times daily. 06/12/13   Kinnie Feil, MD  insulin lispro (HUMALOG) 100 UNIT/ML injection Inject 20-25 Units into the skin 2 (two) times daily. For low blood sugar as needed. Sliding scale 04/24/13   Denita Lung, MD  Multiple Vitamin  (MULTIVITAMIN WITH MINERALS) TABS Take 1 tablet by mouth daily.    Historical Provider, MD  rosuvastatin (CRESTOR) 20 MG tablet Take 1 tablet (20 mg total) by mouth daily. 07/24/14   Denita Lung, MD    Family History Family History  Problem Relation Age of Onset  . Diabetes Mother   . Diabetes Father     Social History Social History  Substance Use Topics  . Smoking status: Never Smoker  . Smokeless tobacco: Never Used  . Alcohol use No     Allergies   Review of patient's allergies indicates no known allergies.   Review of Systems Review of Systems  Constitutional: Negative for fever.  HENT: Positive for congestion.   Respiratory: Positive for cough and shortness of breath.   Cardiovascular: Positive for leg swelling. Negative for chest pain.  All other systems reviewed and are negative.  Physical Exam Updated Vital Signs BP 146/77 (BP Location: Left Arm)   Pulse 84   Temp 98.3 F (36.8 C) (Oral)   Resp 20   Ht 5\' 10"  (1.778 m)   Wt 290 lb (131.5 kg)   SpO2 99%   BMI 41.61 kg/m   Physical Exam  Constitutional: He is oriented to person, place, and time. He appears well-developed and well-nourished.  HENT:  Head: Normocephalic and atraumatic.  Eyes: EOM are normal.  Neck: Normal range of motion.  Cardiovascular: Normal rate, regular rhythm, normal heart sounds and intact distal pulses.   Pulmonary/Chest: Effort normal. No respiratory distress.  Breath sounds are distant  Abdominal: Soft. He exhibits no distension. There is no tenderness.  Musculoskeletal: Normal range of motion. He exhibits edema.  Severe symmetric pitting BLE edema   Neurological: He is alert and oriented to person, place, and time.  Skin: Skin is warm and dry.  Psychiatric: He has a normal mood and affect. Judgment normal.  Nursing note and vitals reviewed.    ED Treatments / Results  DIAGNOSTIC STUDIES:  Oxygen Saturation is 99% on RA, normal by my interpretation.     COORDINATION OF CARE:  5:26 PM Discussed treatment plan with pt at bedside and pt agreed to plan.  Labs (all labs ordered are listed, but only abnormal results are displayed) Labs Reviewed  BASIC METABOLIC PANEL - Abnormal; Notable for the following:       Result Value   CO2 19 (*)    BUN 80 (*)    Creatinine, Ser 8.09 (*)    Calcium 8.8 (*)    GFR calc non Af Amer 7 (*)    GFR calc Af Amer 8 (*)    All other components within normal limits  CBC - Abnormal; Notable for the following:    WBC 11.2 (*)    RBC 4.18 (*)    Hemoglobin 9.3 (*)    HCT 29.6 (*)    MCV 70.8 (*)  MCH 22.2 (*)    RDW 20.6 (*)    All other components within normal limits  I-STAT TROPOININ, ED    EKG  EKG Interpretation  Date/Time:  Friday November 26 2015 12:10:55 EDT Ventricular Rate:  96 PR Interval:  168 QRS Duration: 98 QT Interval:  380 QTC Calculation: 480 R Axis:   66 Text Interpretation:  Sinus rhythm with occasional Premature ventricular complexes Possible Left atrial enlargement Prolonged QT Abnormal ECG Confirmed by RAY MD, Andee Poles [phone]) on 11/26/2015 12:26:29 PM       Radiology Dg Chest 2 View  Result Date: 11/26/2015 CLINICAL DATA:  Dry cough for 6 months. EXAM: CHEST  2 VIEW COMPARISON:  PA and lateral chest 07/12/2012. FINDINGS: Bibasilar airspace disease is worse on the left. No pleural effusion. No pneumothorax. Heart size is upper normal. IMPRESSION: Left worse than right basilar airspace disease worrisome for pneumonia. Recommend follow-up to clearing. Electronically Signed   By: Inge Rise M.D.   On: 11/26/2015 12:57    Procedures Procedures (including critical care time)  Medications Ordered in ED Medications - No data to display   Initial Impression / Assessment and Plan / ED Course  I have reviewed the triage vital signs and the nursing notes.  Pertinent labs & imaging results that were available during my care of the patient were reviewed by me and  considered in my medical decision making (see chart for details).  Clinical Course    55yM with progressive dyspnea. Volume overloaded. He says he is taking a diuretic? I only see HCTZ on his med list. He doesn't seem particularly knowledgeable in general about his medications though. No mention of loop diuretic in recent PCP note. Will place him on lasix. He has severe CKD though and I'm not sure how much he may respond. CXR with pneumonia versus assymetric edema. With cough and blood tinged sputum, I will tx for possible CAP. I think infectious etiology is less likely though. ACS is consideration but symptoms seem atypical. EKG doesn't look acutely changed. No past hx of DVT/PE. LE swelling is symmetric. Denies recent procedures, immobilization, etc.   His o2 sats were 99-100% on room air when ambulating. If he really hasn't been on a loop diuretic than I think a trial is reasonable and doesn't require hospitalization at this point.   It has been determined that no acute conditions requiring further emergency intervention are present at this time. The patient has been advised of the diagnosis and plan. I reviewed any labs and imaging including any potential incidental findings. We have discussed signs and symptoms that warrant return to the ED and they are listed in the discharge instructions.     Final Clinical Impressions(s) / ED Diagnoses   Final diagnoses:  Dyspnea, unspecified type    New Prescriptions New Prescriptions   No medications on file   I personally preformed the services scribed in my presence. The recorded information has been reviewed is accurate. Austin Manifold, MD.     Austin Manifold, MD 12/01/15 [phone]

## 2015-11-26 NOTE — ED Triage Notes (Signed)
Per PT, Pt was sent over by PCP with complaints of SOB, Cough, and swelling in his legs bilaterally for the past six months. Pt denies chest pain. Pt reports having some kidney failure.

## 2015-11-26 NOTE — ED Notes (Signed)
One failed attempted at IV.  Loma Sousa, RN to attempt ultrasound IV.

## 2015-11-27 ENCOUNTER — Encounter (HOSPITAL_COMMUNITY): Payer: Self-pay | Admitting: Emergency Medicine

## 2015-11-27 ENCOUNTER — Emergency Department (HOSPITAL_COMMUNITY)
Admission: EM | Admit: 2015-11-27 | Discharge: 2015-11-27 | Disposition: A | Discharge status: Home / Self Care | Payer: BLUE CROSS/BLUE SHIELD | Attending: Emergency Medicine | Admitting: Emergency Medicine

## 2015-11-27 DIAGNOSIS — E8770 Fluid overload, unspecified: Secondary | ICD-10-CM | POA: Diagnosis not present

## 2015-11-27 DIAGNOSIS — I129 Hypertensive chronic kidney disease with stage 1 through stage 4 chronic kidney disease, or unspecified chronic kidney disease: Secondary | ICD-10-CM | POA: Insufficient documentation

## 2015-11-27 DIAGNOSIS — Z7982 Long term (current) use of aspirin: Secondary | ICD-10-CM | POA: Insufficient documentation

## 2015-11-27 DIAGNOSIS — E119 Type 2 diabetes mellitus without complications: Secondary | ICD-10-CM | POA: Diagnosis not present

## 2015-11-27 DIAGNOSIS — Z8546 Personal history of malignant neoplasm of prostate: Secondary | ICD-10-CM | POA: Diagnosis not present

## 2015-11-27 DIAGNOSIS — R0602 Shortness of breath: Secondary | ICD-10-CM | POA: Diagnosis present

## 2015-11-27 DIAGNOSIS — N189 Chronic kidney disease, unspecified: Secondary | ICD-10-CM | POA: Insufficient documentation

## 2015-11-27 DIAGNOSIS — Z794 Long term (current) use of insulin: Secondary | ICD-10-CM | POA: Insufficient documentation

## 2015-11-27 DIAGNOSIS — I10 Essential (primary) hypertension: Secondary | ICD-10-CM | POA: Diagnosis not present

## 2015-11-27 LAB — BASIC METABOLIC PANEL
ANION GAP: 13 (ref 5–15)
BUN: 84 mg/dL — AB (ref 6–20)
CO2: 19 mmol/L — ABNORMAL LOW (ref 22–32)
Calcium: 8.3 mg/dL — ABNORMAL LOW (ref 8.9–10.3)
Chloride: 103 mmol/L (ref 101–111)
Creatinine, Ser: 8.07 mg/dL — ABNORMAL HIGH (ref 0.61–1.24)
GFR, EST AFRICAN AMERICAN: 8 mL/min — AB (ref >60)
GFR, EST NON AFRICAN AMERICAN: 7 mL/min — AB (ref >60)
Glucose, Bld: 122 mg/dL — ABNORMAL HIGH (ref 65–99)
POTASSIUM: 4 mmol/L (ref 3.5–5.1)
SODIUM: 135 mmol/L (ref 135–145)

## 2015-11-27 LAB — CBC
HEMATOCRIT: 28 % — AB (ref 39.0–52.0)
Hemoglobin: 8.9 g/dL — ABNORMAL LOW (ref 13.0–17.0)
MCH: 22.4 pg — ABNORMAL LOW (ref 26.0–34.0)
MCHC: 31.8 g/dL (ref 30.0–36.0)
MCV: 70.5 fL — ABNORMAL LOW (ref 78.0–100.0)
Platelets: 314 10*3/uL (ref 150–400)
RBC: 3.97 MIL/uL — AB (ref 4.22–5.81)
RDW: 20.4 % — AB (ref 11.5–15.5)
WBC: 9.4 10*3/uL (ref 4.0–10.5)

## 2015-11-27 LAB — I-STAT TROPONIN, ED: Troponin i, poc: 0.07 ng/mL (ref 0.00–0.08)

## 2015-11-27 LAB — BRAIN NATRIURETIC PEPTIDE: B NATRIURETIC PEPTIDE 5: 439.9 pg/mL — AB (ref 0.0–100.0)

## 2015-11-27 NOTE — ED Provider Notes (Signed)
[facility] DEPT Provider Note   CSN: [id number] Arrival date & time: 11/27/15  1432     History   Chief Complaint Chief Complaint  Patient presents with  . Abnormal Lab    HPI Austin Kramer is a 55 y.o. male.  HPI Patient presents emergency department 6 months of cough with some blood-tinged sputum over the past several days.  He was seen in emergency department yesterday and diagnosed with pneumonia and volume overload and was started on antibiotics and diuretics.  He filled these this morning but prior to taking these medications his primary care physician called him and told him that his BNP was elevated and he was concerned that he was volume overloaded and told him to come to the ER for further evaluation.  The patientReports she's not short of breath at this time.  He does report exertional shortness of breath and persistent orthopnea.  He reports his orthopnea is not worsening.  He continues to have lower extreme swelling.  No history of congestive heart failure.  Denies history of DVT or pulmonary embolism.   Past Medical History:  Diagnosis Date  . 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 Chronic kidney disease   . Diabetes mellitus   . ED (erectile dysfunction)   . GERD (gastroesophageal reflux disease)    occasional  . Hypertension    [name]  . Neuromuscular disorder (HCC)    neuropathy, "tingling in feet"  . Obesity   . PPD positive, treated     Patient Active Problem List   Diagnosis Date Noted  . Ureteritis 06/10/2013  . Renal failure 06/10/2013  . Diabetic retinopathy associated with type 2 diabetes mellitus ([place]) 12/17/2012  . 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 Hyponatremia 01/11/2012  . Hypokalemia 01/11/2012  . Renal insufficiency 01/11/2012  . Cellulitis 01/10/2012  . Leukocytosis 01/10/2012  . Diabetes mellitus ([place]) 03/27/2011  . Hypertension associated with diabetes ([place]) 03/27/2011  . Hyperlipidemia LDL goal <70 03/27/2011  . Obesity (BMI  30-39.9) 03/27/2011    Past Surgical History:  Procedure Laterality Date  . AMPUTATION  01/26/2012   Procedure: AMPUTATION RAY;  Surgeon: Newt Minion, MD;  Location: [facility];  Service: Orthopedics;  Laterality: Right;  Right foot 2nd ray amputation  . AV FISTULA PLACEMENT Left 10/09/2014   Procedure: CREATION OF LEFT RADIOCEPHALIC ARTERIOVENOUS (AV) FISTULA ;  Surgeon: Serafina Mitchell, MD;  Location: [facility];  Service: Vascular;  Laterality: Left;  . COLONOSCOPY    . FRACTURE SURGERY     shoulder surgery  . KNEE CARTILAGE SURGERY    . PARS PLANA VITRECTOMY Left 12/17/2012   Procedure: LEFT PARS PLANA VITRECTOMY WITH 25 GAUGE/ENDO LASER;  Surgeon: Hurman Horn, MD;  Location: [facility];  Service: Ophthalmology;  Laterality: Left;  . PHOTOCOAGULATION WITH LASER Left 12/17/2012   Procedure: PHOTOCOAGULATION WITH LASER;  Surgeon: Hurman Horn, MD;  Location: [facility];  Service: Ophthalmology;  Laterality: Left;  . PROSTATECTOMY  2011  . VASECTOMY         Home Medications    Prior to Admission medications   Medication Sig Start Date End Date Taking? Authorizing Provider  amLODipine (NORVASC) 10 MG tablet Take 10 mg by mouth daily.    Historical Provider, MD  amoxicillin (AMOXIL) 500 MG capsule Take 1 capsule (500 mg total) by mouth 2 (two) times daily. 11/26/15   Virgel Manifold, MD  aspirin 81 MG tablet Take 81 mg by mouth daily.  Historical Provider, MD  azithromycin (ZITHROMAX Z-PAK) 250 MG tablet Take 1 tablet (250 mg total) by mouth daily. 2 pills (500mg ) day 1 then 1 pill (250mg ) days 2-5. 11/26/15   Virgel Manifold, MD  ferrous sulfate 325 (65 FE) MG tablet Take 325 mg by mouth daily with breakfast.    Historical Provider, MD  fish oil-omega-3 fatty acids 1000 MG capsule Take 1 g by mouth daily.    Historical Provider, MD  furosemide (LASIX) 40 MG tablet Take 1 tablet (40 mg total) by mouth 2 (two) times daily. 11/26/15   Virgel Manifold, MD  hydrochlorothiazide (HYDRODIURIL) 25 MG tablet Take  25 mg by mouth daily.    Historical Provider, MD  insulin detemir (LEVEMIR) 100 UNIT/ML injection Inject 0.5 mLs (50 Units total) into the skin 2 (two) times daily. 06/12/13   Kinnie Feil, MD  insulin lispro (HUMALOG) 100 UNIT/ML injection Inject 20-25 Units into the skin 2 (two) times daily. For low blood sugar as needed. Sliding scale 04/24/13   Denita Lung, MD  Multiple Vitamin (MULTIVITAMIN WITH MINERALS) TABS Take 1 tablet by mouth daily.    Historical Provider, MD  potassium chloride SA (K-DUR,KLOR-CON) 20 MEQ tablet Take 1 tablet (20 mEq total) by mouth 2 (two) times daily. 11/26/15   Virgel Manifold, MD  rosuvastatin (CRESTOR) 20 MG tablet Take 1 tablet (20 mg total) by mouth daily. 07/24/14   Denita Lung, MD    Family History Family History  Problem Relation Age of Onset  . Diabetes Mother   . Diabetes Father     Social History Social History  Substance Use Topics  . Smoking status: Never Smoker  . Smokeless tobacco: Never Used  . Alcohol use No     Allergies   Review of patient's allergies indicates no known allergies.   Review of Systems Review of Systems  All other systems reviewed and are negative.    Physical Exam Updated Vital Signs BP 167/94   Pulse 95   Temp 98.3 F (36.8 C) (Oral)   Resp 21   Ht 5\' 10"  (1.778 m)   Wt 290 lb (131.5 kg)   SpO2 100%   BMI 41.61 kg/m   Physical Exam  Constitutional: He is oriented to person, place, and time. He appears well-developed and well-nourished.  HENT:  Head: Normocephalic and atraumatic.  Eyes: EOM are normal.  Neck: Normal range of motion.  Cardiovascular: Normal rate, regular rhythm, normal heart sounds and intact distal pulses.   Pulmonary/Chest: Effort normal. No respiratory distress. He has rales.  Abdominal: Soft. He exhibits no distension. There is no tenderness.  Musculoskeletal: Normal range of motion. He exhibits edema.  Neurological: He is alert and oriented to person, place, and time.    Skin: Skin is warm and dry.  Psychiatric: He has a normal mood and affect. Judgment normal.  Nursing note and vitals reviewed.    ED Treatments / Results  Labs (all labs ordered are listed, but only abnormal results are displayed) Labs Reviewed  BASIC METABOLIC PANEL - Abnormal; Notable for the following:       Result Value   CO2 19 (*)    Glucose, Bld 122 (*)    BUN 84 (*)    Creatinine, Ser 8.07 (*)    Calcium 8.3 (*)    GFR calc non Af Amer 7 (*)    GFR calc Af Amer 8 (*)    All other components within normal limits  CBC - Abnormal;  Notable for the following:    RBC 3.97 (*)    Hemoglobin 8.9 (*)    HCT 28.0 (*)    MCV 70.5 (*)    MCH 22.4 (*)    RDW 20.4 (*)    All other components within normal limits  BRAIN NATRIURETIC PEPTIDE - Abnormal; Notable for the following:    B Natriuretic Peptide 439.9 (*)    All other components within normal limits  I-STAT TROPOININ, ED    EKG  EKG Interpretation None       Radiology Dg Chest 2 View  Result Date: 11/26/2015 CLINICAL DATA:  Dry cough for 6 months. EXAM: CHEST  2 VIEW COMPARISON:  PA and lateral chest 07/12/2012. FINDINGS: Bibasilar airspace disease is worse on the left. No pleural effusion. No pneumothorax. Heart size is upper normal. IMPRESSION: Left worse than right basilar airspace disease worrisome for pneumonia. Recommend follow-up to clearing. Electronically Signed   By: Inge Rise M.D.   On: 11/26/2015 12:57    Procedures Procedures (including critical care time)  Medications Ordered in ED Medications - No data to display   Initial Impression / Assessment and Plan / ED Course  I have reviewed the triage vital signs and the nursing notes.  Pertinent labs & imaging results that were available during my care of the patient were reviewed by me and considered in my medical decision making (see chart for details).  Clinical Course    Workup from yesterday reviewed.  I think a course of  antibiotics and loop diuretics is reasonable.  It sounds that the patient responded well to the dose of loop diuretics given in the ER yesterday.  He has best on sleep.  I do not think he needs additional workup at this time.  I think he needs close outpatient follow-up with the nephrology team as he likely will need dialysis sooner than later.  I also think he needs to follow-up with cardiology for an outpatient echocardiogram.  He will begin monitoring daily weights.  He will take the Lasix as directed by Dr. Jaquita Rector high.  I discussed his care with his primary care physician Dr. Redmond School, MD regarding his care and his workup yesterday and today in the emergency department.  He is agreeable to follow-up the patient as an outpatient now that he seems to be responding to loop diuretics.  I've asked the patient to return to the emergency department for any new or worsening symptoms  Final Clinical Impressions(s) / ED Diagnoses   Final diagnoses:  Hypervolemia, unspecified hypervolemia type  Chronic renal failure, unspecified CKD stage    New Prescriptions New Prescriptions   No medications on file     Jola Schmidt, MD 11/27/15 1947

## 2015-11-27 NOTE — ED Triage Notes (Signed)
Pt reports that he was contacted by his primary doctor to tell him that his BNP is greater than 400. Pt denies symptoms.

## 2015-11-29 ENCOUNTER — Encounter: Payer: Self-pay | Admitting: Family Medicine

## 2015-11-29 NOTE — Progress Notes (Signed)
I talked to him today. He has lost 5 pounds since his visit to the emergency room. He is to set up an appointment with [facility] cardiology to follow-up on his failure. He is working. I sent an email to Dr. Justin Mend concerning his failure and renal status. He apparently is scheduled to be rechecked there in 3 months.

## 2015-12-16 ENCOUNTER — Encounter: Payer: Self-pay | Admitting: Cardiology

## 2015-12-16 DIAGNOSIS — E113593 Type 2 diabetes mellitus with proliferative diabetic retinopathy without macular edema, bilateral: Secondary | ICD-10-CM | POA: Diagnosis not present

## 2015-12-16 DIAGNOSIS — E113553 Type 2 diabetes mellitus with stable proliferative diabetic retinopathy, bilateral: Secondary | ICD-10-CM | POA: Diagnosis not present

## 2015-12-16 DIAGNOSIS — H25813 Combined forms of age-related cataract, bilateral: Secondary | ICD-10-CM | POA: Diagnosis not present

## 2015-12-16 DIAGNOSIS — E113313 Type 2 diabetes mellitus with moderate nonproliferative diabetic retinopathy with macular edema, bilateral: Secondary | ICD-10-CM | POA: Diagnosis not present

## 2015-12-17 ENCOUNTER — Ambulatory Visit (INDEPENDENT_AMBULATORY_CARE_PROVIDER_SITE_OTHER): Payer: BLUE CROSS/BLUE SHIELD | Admitting: Cardiology

## 2015-12-17 ENCOUNTER — Encounter: Payer: Self-pay | Admitting: Cardiology

## 2015-12-17 VITALS — BP 138/72 | HR 100 | Ht 72.0 in | Wt 288.0 lb

## 2015-12-17 DIAGNOSIS — E1122 Type 2 diabetes mellitus with diabetic chronic kidney disease: Secondary | ICD-10-CM

## 2015-12-17 DIAGNOSIS — Z794 Long term (current) use of insulin: Secondary | ICD-10-CM

## 2015-12-17 DIAGNOSIS — R0609 Other forms of dyspnea: Secondary | ICD-10-CM

## 2015-12-17 DIAGNOSIS — N186 End stage renal disease: Secondary | ICD-10-CM | POA: Diagnosis not present

## 2015-12-17 DIAGNOSIS — R0602 Shortness of breath: Secondary | ICD-10-CM

## 2015-12-17 DIAGNOSIS — R072 Precordial pain: Secondary | ICD-10-CM | POA: Diagnosis not present

## 2015-12-17 DIAGNOSIS — N184 Chronic kidney disease, stage 4 (severe): Secondary | ICD-10-CM

## 2015-12-17 NOTE — Patient Instructions (Signed)
Schedule Lexiscan Myoview   Schedule Echocardiogram     Your physician recommends that you schedule a follow-up appointment with Dr.Nelson 1 week after test.

## 2015-12-17 NOTE — Progress Notes (Signed)
Cardiology Office Note  NEW PATIENT VISIT Date:  12/17/2015   ID:  Austin Kramer, DOB 07-Sep-1960, MRN [id number]  PCP:  Wyatt Haste, MD  Cardiologist:  NEW    Chief Complaint  Patient presents with  . Chest Pain  . Leg Swelling  . Shortness of Breath      History of Present Illness: Austin Kramer is a 55 y.o. male who presents for eval of CAD for DOE edema and occ chest pain.  He has a hx of DM-2 since his 26s and on insulin for 10 years.  He has CKD stage 4- 5 recent Cr 8.09 and BUN 80. BNP 415.  He has a fistula in Lt wrist and has not yet been dialyzed.  He is also on Renal transplant list.    Recent increasing DOE and swelling, Dr. Justin Mend has him on lasix 40 mg BID.  He continues to void and has lost weight on this dose and with his compression hose the edema has improved.   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 At times he has to stop for SOB. With rest it resolves.  At times he has chest pain associated.  He does have a cough and and at times productive with bloody streaks, he is recovering from PNA as well and now off antibiotics.          Past Medical History:  Diagnosis Date  . 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 Chronic kidney disease   . Diabetes mellitus   . ED (erectile dysfunction)   . GERD (gastroesophageal reflux disease)    occasional  . Hypertension    [name]  . Neuromuscular disorder (HCC)    neuropathy, "tingling in feet"  . Obesity   . PPD positive, treated     Past Surgical History:  Procedure Laterality Date  . AMPUTATION  01/26/2012   Procedure: AMPUTATION RAY;  Surgeon: Newt Minion, MD;  Location: [facility];  Service: Orthopedics;  Laterality: Right;  Right foot 2nd ray amputation  . AV FISTULA PLACEMENT Left 10/09/2014   Procedure: CREATION OF LEFT RADIOCEPHALIC ARTERIOVENOUS (AV) FISTULA ;  Surgeon: Serafina Mitchell, MD;  Location: [facility];  Service: Vascular;  Laterality: Left;  .  COLONOSCOPY    . FRACTURE SURGERY     shoulder surgery  . KNEE CARTILAGE SURGERY    . PARS PLANA VITRECTOMY Left 12/17/2012   Procedure: LEFT PARS PLANA VITRECTOMY WITH 25 GAUGE/ENDO LASER;  Surgeon: Hurman Horn, MD;  Location: [facility];  Service: Ophthalmology;  Laterality: Left;  . PHOTOCOAGULATION WITH LASER Left 12/17/2012   Procedure: PHOTOCOAGULATION WITH LASER;  Surgeon: Hurman Horn, MD;  Location: [facility];  Service: Ophthalmology;  Laterality: Left;  . PROSTATECTOMY  2011  . VASECTOMY       Current Outpatient Prescriptions  Medication Sig Dispense Refill  . amLODipine (NORVASC) 10 MG tablet Take 10 mg by mouth daily.    Marland Kitchen aspirin 81 MG tablet Take 81 mg by mouth daily.    . calcitRIOL (ROCALTROL) 0.25 MCG capsule Take 0.25 mcg by mouth daily.    . cloNIDine (CATAPRES) 0.2 MG tablet Take 0.2 mg by mouth 2 (two) times daily.    . ferrous sulfate 325 (65 FE) MG tablet Take 325 mg by mouth daily with breakfast.    . fish oil-omega-3 fatty acids 1000 MG capsule Take 1 g by mouth daily.    Marland Kitchen  furosemide (LASIX) 80 MG tablet Take 80 mg by mouth 2 (two) times daily.    . hydrochlorothiazide (HYDRODIURIL) 25 MG tablet Take 25 mg by mouth daily.    . insulin detemir (LEVEMIR) 100 UNIT/ML injection Inject 0.5 mLs (50 Units total) into the skin 2 (two) times daily. 20 mL 0  . insulin lispro (HUMALOG) 100 UNIT/ML injection Inject 20-25 Units into the skin 2 (two) times daily. For low blood sugar as needed. Sliding scale 20 mL 0  . Multiple Vitamin (MULTIVITAMIN WITH MINERALS) TABS Take 1 tablet by mouth daily.    . potassium chloride SA (K-DUR,KLOR-CON) 20 MEQ tablet Take 1 tablet (20 mEq total) by mouth 2 (two) times daily. 10 tablet 0  . rosuvastatin (CRESTOR) 20 MG tablet Take 1 tablet (20 mg total) by mouth daily. 90 tablet 3  . simvastatin (ZOCOR) 20 MG tablet Take 20 mg by mouth daily.     No current facility-administered medications for this visit.     Allergies:   Review of patient's  allergies indicates no known allergies.    Social History:  The patient  reports that he has never smoked. He has never used smokeless tobacco. He reports that he does not drink alcohol or use drugs.   Family History:  The patient's family history includes Diabetes in his father and mother.  No FH of CAD   ROS:  General:no colds or fevers- recent PNA now off ABX,  weight is down some Skin:no rashes or ulcers HEENT:no blurred vision, no congestion CV:see HPI PUL:see HPI GI:no diarrhea constipation or melena, no indigestion GU:no hematuria, no dysuria MS:no joint pain, no claudication Neuro:no syncope, no lightheadedness Endo:+ diabetes, no thyroid disease Anemia- + iron def anemia receives IV Iron  Wt Readings from Last 3 Encounters:  12/17/15 288 lb (130.6 kg)  11/27/15 290 lb (131.5 kg)  11/26/15 290 lb (131.5 kg)     PHYSICAL EXAM: VS:  BP 138/72   Pulse 100   Ht 6' (1.829 m)   Wt 288 lb (130.6 kg)   BMI 39.06 kg/m  , BMI Body mass index is 39.06 kg/m. General:Pleasant affect, NAD Skin:Warm and dry, brisk capillary refill HEENT:normocephalic, sclera clear, mucus membranes moist Neck:supple, no JVD, no bruits  Heart:S1S2 RRR without murmur, gallup, rub or click Lungs: with few rales in bases, no rhonchi, or wheezes GYF:VCBSW,HQPR, non tender, + BS, do not palpate liver spleen or masses Ext:+ lower ext edema, Lt leg > rt -Lt leg tight but both with equal edema at ankles, he did not wear compression hose today. 2+ post. tib pulses bil., 2+ radial pulses Neuro:alert and oriented X 3, MAE, follows commands, + facial symmetry    EKG:  EKG is ordered today. The ekg ordered today demonstrates ST with rt atrial enlargement.poor R wave progression.     Recent Labs: 11/25/2015: ALT 14 11/27/2015: B Natriuretic Peptide 439.9; BUN 84; Creatinine, Ser 8.07; Hemoglobin 8.9; Platelets 314; Potassium 4.0; Sodium 135    Lipid Panel    Component Value Date/Time   CHOL 171  11/25/2015 1616   TRIG 107 11/25/2015 1616   HDL 37 (L) 11/25/2015 1616   CHOLHDL 4.6 11/25/2015 1616   VLDL 21 11/25/2015 1616   LDLCALC 113 11/25/2015 1616       Other studies Reviewed: Additional studies/ records that were reviewed today include: review of Dr. Lanice Shirts notes and ER notes. .   ASSESSMENT AND PLAN:  1.  Increasing episodes of edema and SOB, especially  DOE, which could be ischemia.  Will do echo to eval LV function and Lexiscan myoview to rule out ischemia in male with DM, HTN and hyperlipidemia.   Additionally pt is/wil be on renal transplant list and would need lexiscan myoview.   I discussed with Dr. Meda Coffee and she will follow up with the Pt.  2. Chest pain see above  3. 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 May need dialysis soon, he will be on list for renal transplant as well.  4.  DM-2 long term insulin dependent with renal failure followed by Dr. Justin Mend.  5. Hyperlipidemia followed by Dr. Redmond School, on statin.  6. Essential HTN.controlled.   Current medicines are reviewed with the patient today.  The patient Has no concerns regarding medicines.  The following changes have been made:  See above Labs/ tests ordered today include:see above  Disposition:   FU:  see above  Signed, Cecilie Kicks, NP  12/17/2015 4:05 PM    [facility] [place], [place] [place], [place] Phone: [phone]; Fax: [phone]

## 2015-12-20 NOTE — Addendum Note (Signed)
Addended by: Valere Dross on: 12/20/2015 01:45 PM   Modules accepted: Orders

## 2015-12-21 ENCOUNTER — Ambulatory Visit: Payer: BLUE CROSS/BLUE SHIELD | Admitting: Physician Assistant

## 2016-01-10 ENCOUNTER — Other Ambulatory Visit (HOSPITAL_COMMUNITY): Payer: BLUE CROSS/BLUE SHIELD

## 2016-01-10 ENCOUNTER — Ambulatory Visit (HOSPITAL_COMMUNITY): Payer: BLUE CROSS/BLUE SHIELD

## 2016-01-11 ENCOUNTER — Ambulatory Visit (HOSPITAL_COMMUNITY): Payer: BLUE CROSS/BLUE SHIELD

## 2016-01-20 ENCOUNTER — Telehealth: Payer: Self-pay | Admitting: *Deleted

## 2016-01-20 ENCOUNTER — Encounter: Payer: Self-pay | Admitting: Physician Assistant

## 2016-01-20 ENCOUNTER — Ambulatory Visit: Payer: BLUE CROSS/BLUE SHIELD | Admitting: Physician Assistant

## 2016-01-20 NOTE — Progress Notes (Deleted)
Cardiology Office Note    Date:  01/20/2016  ID:  Austin Kramer, DOB January 29, 1961, MRN [id number] PCP:  Austin Haste, MD  Cardiologist:  Dr. Meda Coffee plans to follow pt   Chief Complaint: f/u testing  History of Present Illness:  Austin Kramer is a 55 y.o. male with history of DM since his 50s, prostate CA, CKD stage V with fistula, GERD, HTN, hyperlipidemia (followed by PCP), neuropathy, obesity who presents for f/u. He was seen as a new patient last month with DOE, occasional CP, occasional cough productive with bloody streaks, and edema. Dr. Justin Mend had had him on Lasix 40mg  BID. He is soon to be on the transplant list for kidney.  hemoptysis    Past Medical History:  Diagnosis Date  . 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 Chronic kidney disease   . CKD (chronic kidney disease) stage 5, GFR less than 15 ml/min (HCC)   . Diabetes mellitus   . ED (erectile dysfunction)   . GERD (gastroesophageal reflux disease)    occasional  . Hyperlipidemia   . Hypertension    [name]  . Neuromuscular disorder (HCC)    neuropathy, "tingling in feet"  . Obesity   . PPD positive, treated     Past Surgical History:  Procedure Laterality Date  . AMPUTATION  01/26/2012   Procedure: AMPUTATION RAY;  Surgeon: Newt Minion, MD;  Location: [facility];  Service: Orthopedics;  Laterality: Right;  Right foot 2nd ray amputation  . AV FISTULA PLACEMENT Left 10/09/2014   Procedure: CREATION OF LEFT RADIOCEPHALIC ARTERIOVENOUS (AV) FISTULA ;  Surgeon: Serafina Mitchell, MD;  Location: [facility];  Service: Vascular;  Laterality: Left;  . COLONOSCOPY    . FRACTURE SURGERY     shoulder surgery  . KNEE CARTILAGE SURGERY    . PARS PLANA VITRECTOMY Left 12/17/2012   Procedure: LEFT PARS PLANA VITRECTOMY WITH 25 GAUGE/ENDO LASER;  Surgeon: Hurman Horn, MD;  Location: [facility];  Service: Ophthalmology;  Laterality: Left;  . PHOTOCOAGULATION WITH LASER Left 12/17/2012   Procedure: PHOTOCOAGULATION WITH  LASER;  Surgeon: Hurman Horn, MD;  Location: [facility];  Service: Ophthalmology;  Laterality: Left;  . PROSTATECTOMY  2011  . VASECTOMY      Current Medications: Current Outpatient Prescriptions  Medication Sig Dispense Refill  . amLODipine (NORVASC) 10 MG tablet Take 10 mg by mouth daily.    Marland Kitchen aspirin 81 MG tablet Take 81 mg by mouth daily.    . calcitRIOL (ROCALTROL) 0.25 MCG capsule Take 0.25 mcg by mouth daily.    . cloNIDine (CATAPRES) 0.2 MG tablet Take 0.2 mg by mouth 2 (two) times daily.    . ferrous sulfate 325 (65 FE) MG tablet Take 325 mg by mouth daily with breakfast.    . fish oil-omega-3 fatty acids 1000 MG capsule Take 1 g by mouth daily.    . furosemide (LASIX) 80 MG tablet Take 80 mg by mouth 2 (two) times daily.    . hydrochlorothiazide (HYDRODIURIL) 25 MG tablet Take 25 mg by mouth daily.    . insulin detemir (LEVEMIR) 100 UNIT/ML injection Inject 0.5 mLs (50 Units total) into the skin 2 (two) times daily. 20 mL 0  . insulin lispro (HUMALOG) 100 UNIT/ML injection Inject 20-25 Units into the skin 2 (two) times daily. For low blood sugar as needed. Sliding scale 20 mL 0  . Multiple Vitamin (MULTIVITAMIN WITH MINERALS) TABS Take 1 tablet by mouth  daily.    . potassium chloride SA (K-DUR,KLOR-CON) 20 MEQ tablet Take 1 tablet (20 mEq total) by mouth 2 (two) times daily. 10 tablet 0  . rosuvastatin (CRESTOR) 20 MG tablet Take 1 tablet (20 mg total) by mouth daily. 90 tablet 3  . simvastatin (ZOCOR) 20 MG tablet Take 20 mg by mouth daily.     No current facility-administered medications for this visit.      Allergies:   Patient has no known allergies.   Social History   Social History  . Marital status: Married    Spouse name: Austin Kramer  . Number of children: 4  . Years of education: Austin Kramer   Occupational History  . warehouse    Social History Main Topics  . Smoking status: Never Smoker  . Smokeless tobacco: Never Used  . Alcohol use No  . Drug use: No  . Sexual activity:  Yes   Other Topics Concern  . Not on file   Social History Narrative  . No narrative on file     Family History:  The patient's family history includes Diabetes in his father and mother. ***  ROS:   Please see the history of present illness. Otherwise, review of systems is positive for ***.  All other systems are reviewed and otherwise negative.    PHYSICAL EXAM:   VS:  There were no vitals taken for this visit.  BMI: There is no height or weight on file to calculate BMI. GEN: Well nourished, well developed, in no acute distress  HEENT: normocephalic, atraumatic Neck: no JVD, carotid bruits, or masses Cardiac: ***RRR; no murmurs, rubs, or gallops, no edema  Respiratory:  clear to auscultation bilaterally, normal work of breathing GI: soft, nontender, nondistended, + BS MS: no deformity or atrophy  Skin: warm and dry, no rash Neuro:  Alert and Oriented x 3, Strength and sensation are intact, follows commands Psych: euthymic mood, full affect  Wt Readings from Last 3 Encounters:  12/17/15 288 lb (130.6 kg)  11/27/15 290 lb (131.5 kg)  11/26/15 290 lb (131.5 kg)      Studies/Labs Reviewed:   EKG:  EKG was ordered today and personally reviewed by me and demonstrates *** EKG was not ordered today.***  Recent Labs: 11/25/2015: ALT 14 11/27/2015: B Natriuretic Peptide 439.9; BUN 84; Creatinine, Ser 8.07; Hemoglobin 8.9; Platelets 314; Potassium 4.0; Sodium 135   Lipid Panel    Component Value Date/Time   CHOL 171 11/25/2015 1616   TRIG 107 11/25/2015 1616   HDL 37 (L) 11/25/2015 1616   CHOLHDL 4.6 11/25/2015 1616   VLDL 21 11/25/2015 1616   LDLCALC 113 11/25/2015 1616    Additional studies/ records that were reviewed today include: Summarized above.***    ASSESSMENT & PLAN:   1. Chest pain 2. Shortness of breath 3. CKD 5 4. HTN 5. Hyperlipidemia  Disposition: F/u with ***   Medication Adjustments/Labs and Tests Ordered: Current medicines are reviewed  at length with the patient today.  Concerns regarding medicines are outlined above. Medication changes, Labs and Tests ordered today are summarized above and listed in the Patient Instructions accessible in Encounters.   Raechel Ache PA-C  01/20/2016 1:58 PM    [facility] [place], [place], [place]  [address] Phone: [phone]; Fax: [phone]

## 2016-01-20 NOTE — Telephone Encounter (Signed)
Left pt a message re: appt today with Melina Copa, PA-C.  Pt was supposed to have Stress & Echo done before f/u and he cancelled it and rescheduled it for later this month, and it's no need in him coming until after the tests are complete.

## 2016-01-31 ENCOUNTER — Telehealth (HOSPITAL_COMMUNITY): Payer: Self-pay | Admitting: *Deleted

## 2016-01-31 DIAGNOSIS — H2511 Age-related nuclear cataract, right eye: Secondary | ICD-10-CM | POA: Diagnosis not present

## 2016-01-31 DIAGNOSIS — H25811 Combined forms of age-related cataract, right eye: Secondary | ICD-10-CM | POA: Diagnosis not present

## 2016-01-31 DIAGNOSIS — H25011 Cortical age-related cataract, right eye: Secondary | ICD-10-CM | POA: Diagnosis not present

## 2016-01-31 NOTE — Telephone Encounter (Signed)
Patient given detailed instructions per Myocardial Perfusion Study Information Sheet for the test on 02/02/16 at 1230. Patient notified to arrive 15 minutes early and that it is imperative to arrive on time for appointment to keep from having the test rescheduled.  If you need to cancel or reschedule your appointment, please call the office within 24 hours of your appointment. Failure to do so may result in a cancellation of your appointment, and a $50 no show fee. Patient verbalized understanding.Kataya Guimont, Ranae Palms

## 2016-02-02 ENCOUNTER — Other Ambulatory Visit: Payer: Self-pay

## 2016-02-02 ENCOUNTER — Ambulatory Visit (HOSPITAL_BASED_OUTPATIENT_CLINIC_OR_DEPARTMENT_OTHER): Payer: BLUE CROSS/BLUE SHIELD

## 2016-02-02 ENCOUNTER — Ambulatory Visit (HOSPITAL_COMMUNITY): Payer: BLUE CROSS/BLUE SHIELD | Attending: Cardiology

## 2016-02-02 DIAGNOSIS — I871 Compression of vein: Secondary | ICD-10-CM | POA: Diagnosis not present

## 2016-02-02 DIAGNOSIS — I340 Nonrheumatic mitral (valve) insufficiency: Secondary | ICD-10-CM | POA: Diagnosis not present

## 2016-02-02 DIAGNOSIS — I5189 Other ill-defined heart diseases: Secondary | ICD-10-CM | POA: Diagnosis not present

## 2016-02-02 DIAGNOSIS — R0602 Shortness of breath: Secondary | ICD-10-CM | POA: Diagnosis not present

## 2016-02-02 DIAGNOSIS — I361 Nonrheumatic tricuspid (valve) insufficiency: Secondary | ICD-10-CM | POA: Diagnosis not present

## 2016-02-02 DIAGNOSIS — R9439 Abnormal result of other cardiovascular function study: Secondary | ICD-10-CM | POA: Diagnosis not present

## 2016-02-02 DIAGNOSIS — R072 Precordial pain: Secondary | ICD-10-CM

## 2016-02-02 MED ORDER — REGADENOSON 0.4 MG/5ML IV SOLN
0.4000 mg | Freq: Once | INTRAVENOUS | Status: AC
  Administered 2016-02-02: 0.4 mg via INTRAVENOUS

## 2016-02-02 MED ORDER — TECHNETIUM TC 99M TETROFOSMIN IV KIT
32.4000 | PACK | Freq: Once | INTRAVENOUS | Status: AC | PRN
  Administered 2016-02-02: 32.4 via INTRAVENOUS
  Filled 2016-02-02: qty 33

## 2016-02-03 ENCOUNTER — Telehealth: Payer: Self-pay | Admitting: *Deleted

## 2016-02-03 ENCOUNTER — Ambulatory Visit (HOSPITAL_COMMUNITY): Payer: BLUE CROSS/BLUE SHIELD | Attending: Internal Medicine

## 2016-02-03 DIAGNOSIS — R0609 Other forms of dyspnea: Secondary | ICD-10-CM

## 2016-02-03 DIAGNOSIS — I2699 Other pulmonary embolism without acute cor pulmonale: Secondary | ICD-10-CM

## 2016-02-03 DIAGNOSIS — R0600 Dyspnea, unspecified: Secondary | ICD-10-CM

## 2016-02-03 MED ORDER — TECHNETIUM TC 99M TETROFOSMIN IV KIT
31.9000 | PACK | Freq: Once | INTRAVENOUS | Status: AC | PRN
  Administered 2016-02-03: 31.9 via INTRAVENOUS
  Filled 2016-02-03: qty 32

## 2016-02-03 NOTE — Telephone Encounter (Signed)
Called pt re: echo results and to advise him that we have ordered a CTA to check for thromboembolism. Orders for CTA have been placed.  Left pt a message to call back.

## 2016-02-03 NOTE — Telephone Encounter (Signed)
-----   Message from Isaiah Serge, NP sent at 02/02/2016  3:52 PM EST ----- Dr. Meda Coffee I saw this pt that is new - chest pressure SOB with exertion also with elevated Cr and has graft for dialysis and discussed with you we did echo and he is for nuc.  Please Review!! PA pressure 95.  Thanks Mickel Baas,

## 2016-02-04 ENCOUNTER — Encounter (HOSPITAL_COMMUNITY): Payer: Self-pay | Admitting: Vascular Surgery

## 2016-02-04 LAB — MYOCARDIAL PERFUSION IMAGING
CHL CUP NUCLEAR SRS: 4
CHL CUP NUCLEAR SSS: 5
CHL CUP RESTING HR STRESS: 98 {beats}/min
CSEPPHR: 107 {beats}/min
LV sys vol: 144 mL
LVDIAVOL: 247 mL (ref 62–150)
NUC STRESS TID: 1.04
RATE: 0.42
SDS: 1

## 2016-02-04 NOTE — Progress Notes (Signed)
Anesthesia chart review: SAME DAY WORK-UP.  Patient is a 55 year old male scheduled for pars plana vitrectomy with 25-gauge with endolaser (right) - with block on 02/05/16 by Dr. Jalene Mullet. DX: vitreous hemorrhage, right eye. Case is posted for MAC--however, patient does not think that he can lie flat for over 15 minutes.   History includes CKD stage V (not yet on hemodialysis, but s/p left radiocephalic AVF 4/65/68), DM (on insulin), prostate cancer s/p prostatectomy '11, GERD, HTN, ED, positive PPD (treated), s/p left pars plana vitrectomy '14, right 2nd toe amputation '13, non-smoker, obesity. 02/02/16 echo shows elevated PA peak pressures consistent with severe pulmonary hypertension. He has a pending chest CTA to evaluate for chronic PE.  PCP is Dr. Jill Alexanders. Nephrologist is Dr. Edrick Oh. Cardiologist is Dr. Gaylan Gerold newly established on 12/17/15 at visit with Cecilie Kicks, NP on for evaluation of chest pressure and exertional dyspnea. He was also needing cardiac testing to be considered for renal transplant (pending evaluation at [facility]).  Meds include amlodipine, aspirin 81 mg, calcitriol, clonidine, para sulfate, fish oil, Lasix, HCTZ, Levemir, Humalog, KCl, Zocor.  Nuclear stress test 02/03/16:   Nuclear stress EF: 42%.  There was no ST segment deviation noted during stress.  The left ventricular ejection fraction is moderately decreased (30-44%).  This is an intermediate risk study due to reduced systolic function. There was no ischemia.  Echo 02/02/16: Study Conclusions - Left ventricle: The cavity size was normal. Wall thickness was   increased in a pattern of moderate LVH. Systolic function was   mildly to moderately reduced. The estimated ejection fraction was   in the range of 40% to 45%. Diffuse hypokinesis. The study is not   technically sufficient to allow evaluation of LV diastolic   function. - Ventricular septum: The contour showed diastolic  flattening and   systolic flattening. - Mitral valve: Mildly thickened leaflets . There was mild   regurgitation. - Left atrium: The atrium was mildly dilated. - Right ventricle: The cavity size was normal. RVH is present.   Systolic function was normal. - Right atrium: The atrium was mildly dilated. - Tricuspid valve: There was trivial regurgitation. - Pulmonary arteries: The main pulmonary artery is dilated. PA peak   pressure: 94 mm Hg (S). - Inferior vena cava: The vessel was dilated. The respirophasic   diameter changes were blunted (< 50%), consistent with elevated   central venous pressure. Impressions: - LVEF 40-45%, moderate global hypokinesis, flattening of the   intraventricular septum suggestive of pressure overload of the   RV, mild MR, mild LAE, normal RV size with RVH and normal   systolic function, mild RAE, mild TR, RVSP 94 mmHg (consistent   with severe pulmonary hypertension), dilated IVC, no pericardial   effusion. (On 02/02/16, Dr. Meda Coffee recommended a chest CTA to evaluate for chronic thromboembolism.)  EKG 12/17/15: Sinus tach at 101 bpm, right atrial enlargement, possible anterior infarct, age undetermined.  CXR 11/26/15: FINDINGS: Bibasilar airspace disease is worse on the left. No pleural effusion. No pneumothorax. Heart size is upper normal. IMPRESSION: Left worse than right basilar airspace disease worrisome for pneumonia. Recommend follow-up to clearing.  He is for labs prior to surgery. Last BUN/Cr were 84/8.07 and H/H 8.9/28.0 on 11/27/15.   I called and spoke with patient. He reports that for he has had a gradual onset of SOB over the past several months. He has had associated coughing. Apparently in September even had some hemoptysis (blood tinged sputum). Symptoms became  worrisome enough that he saw Dr. Redmond School on 11/25/15 and was referred to the ED for further evaluation. CXR showed bibasilar airspace disease (PNA versus asymmetric edema). He was  treated with Zithromax. Patient has not noticed much improvement in his symptoms. No SOB at rest, but has noticed increased exertional dyspnea when going up a flight of stairs. He is sleeping in a recliner and doesn't think that he can lie flat for over 15 minutes. Coughing is mainly dry and occurs with sitting or lying. No coughing or conversational dyspnea noted during our phone conversations today. He also reports LE edema and is on diuretic therapy. He believes he will have to start hemodialysis once he become more symptomatic. His wife says he snores, but he has never had a sleep study or diagnosis of OSA. He does not use home oxygen. He denied known MI/CAD. He has had intermittent chest pain after long coughing spells. DM has been overall controlled.   I have spoken with anesthesiologists Dr. Lissa Hoard and Dr. Marcie Bal about this patient. I spoke with cardiologist Dr. Meda Coffee. She does not feel that patient would require chest CTA prior to surgery as it will only help with long term management plan and did not feel that there was a short term intervention that would improve his operative risk. He is at higher risk if general anesthesia is needed, but she would defer to his anesthesiologist whether on not to proceed if case could only be done under general anesthesia. Discussed as well with Dr. Posey Pronto. He was made aware of echo results/pulmonary HTN and that patient may require GA due to inability to lie flat for long periods and history of coughing, and that if GA is needed patient may require admission with prolonged intubation. Dr. Ermalene Postin will likely be the assigned anesthesiologist, so Dr. Lissa Hoard has provided him some some details as well. Patient will be evaluated on the day of surgery to discuss the definitve plan. I did advise patient that based on echo results, he is at higher risk if general anesthesia is needed and that he may require admission. His anesthesiologist will discuss further with him  tomorrow.   Austin Kramer [facility] Short Stay Center/Anesthesiology Phone [phone] 02/04/2016 12:34 PM

## 2016-02-04 NOTE — Progress Notes (Signed)
   02/04/16 1235  OBSTRUCTIVE SLEEP APNEA  Have you ever been diagnosed with sleep apnea through a sleep study? No  Do you snore loudly (loud enough to be heard through closed doors)?  1  Do you often feel tired, fatigued, or sleepy during the daytime (such as falling asleep during driving or talking to someone)? 1  Has anyone observed you stop breathing during your sleep? 0  Do you have, or are you being treated for high blood pressure? 1  BMI more than 35 kg/m2? 1  Age > 72 (1-yes) 1  Male Gender (Yes=1) 1  Obstructive Sleep Apnea Score 6  Score 5 or greater  Results sent to PCP

## 2016-02-05 ENCOUNTER — Encounter (HOSPITAL_COMMUNITY)
Admission: RE | Disposition: A | Discharge status: Home / Self Care | Payer: Self-pay | Source: Ambulatory Visit | Attending: Ophthalmology

## 2016-02-05 ENCOUNTER — Ambulatory Visit (HOSPITAL_COMMUNITY): Payer: BLUE CROSS/BLUE SHIELD | Admitting: Anesthesiology

## 2016-02-05 ENCOUNTER — Encounter (HOSPITAL_COMMUNITY): Payer: Self-pay | Admitting: *Deleted

## 2016-02-05 ENCOUNTER — Ambulatory Visit: Payer: Self-pay | Admitting: Ophthalmology

## 2016-02-05 ENCOUNTER — Ambulatory Visit (HOSPITAL_COMMUNITY)
Admission: RE | Admit: 2016-02-05 | Discharge: 2016-02-05 | Disposition: A | Discharge status: Home / Self Care | Payer: BLUE CROSS/BLUE SHIELD | Source: Ambulatory Visit | Attending: Ophthalmology | Admitting: Ophthalmology

## 2016-02-05 DIAGNOSIS — E669 Obesity, unspecified: Secondary | ICD-10-CM | POA: Diagnosis not present

## 2016-02-05 DIAGNOSIS — E113591 Type 2 diabetes mellitus with proliferative diabetic retinopathy without macular edema, right eye: Secondary | ICD-10-CM | POA: Diagnosis not present

## 2016-02-05 DIAGNOSIS — Z992 Dependence on renal dialysis: Secondary | ICD-10-CM | POA: Diagnosis not present

## 2016-02-05 DIAGNOSIS — I272 Pulmonary hypertension, unspecified: Secondary | ICD-10-CM | POA: Insufficient documentation

## 2016-02-05 DIAGNOSIS — H2101 Hyphema, right eye: Secondary | ICD-10-CM | POA: Insufficient documentation

## 2016-02-05 DIAGNOSIS — Z6837 Body mass index (BMI) 37.0-37.9, adult: Secondary | ICD-10-CM | POA: Insufficient documentation

## 2016-02-05 DIAGNOSIS — E1122 Type 2 diabetes mellitus with diabetic chronic kidney disease: Secondary | ICD-10-CM | POA: Diagnosis not present

## 2016-02-05 DIAGNOSIS — K219 Gastro-esophageal reflux disease without esophagitis: Secondary | ICD-10-CM | POA: Insufficient documentation

## 2016-02-05 DIAGNOSIS — E1140 Type 2 diabetes mellitus with diabetic neuropathy, unspecified: Secondary | ICD-10-CM | POA: Insufficient documentation

## 2016-02-05 DIAGNOSIS — Z794 Long term (current) use of insulin: Secondary | ICD-10-CM | POA: Insufficient documentation

## 2016-02-05 DIAGNOSIS — E871 Hypo-osmolality and hyponatremia: Secondary | ICD-10-CM | POA: Diagnosis not present

## 2016-02-05 DIAGNOSIS — Z89421 Acquired absence of other right toe(s): Secondary | ICD-10-CM | POA: Diagnosis not present

## 2016-02-05 DIAGNOSIS — I120 Hypertensive chronic kidney disease with stage 5 chronic kidney disease or end stage renal disease: Secondary | ICD-10-CM | POA: Diagnosis not present

## 2016-02-05 DIAGNOSIS — E785 Hyperlipidemia, unspecified: Secondary | ICD-10-CM | POA: Insufficient documentation

## 2016-02-05 DIAGNOSIS — H4311 Vitreous hemorrhage, right eye: Secondary | ICD-10-CM | POA: Diagnosis not present

## 2016-02-05 DIAGNOSIS — Z79899 Other long term (current) drug therapy: Secondary | ICD-10-CM | POA: Insufficient documentation

## 2016-02-05 DIAGNOSIS — Z8546 Personal history of malignant neoplasm of prostate: Secondary | ICD-10-CM | POA: Diagnosis not present

## 2016-02-05 DIAGNOSIS — N185 Chronic kidney disease, stage 5: Secondary | ICD-10-CM | POA: Diagnosis not present

## 2016-02-05 HISTORY — PX: PARS PLANA VITRECTOMY: SHX2166

## 2016-02-05 HISTORY — DX: Pulmonary hypertension, unspecified: I27.20

## 2016-02-05 LAB — CBC
HEMATOCRIT: 27.2 % — AB (ref 39.0–52.0)
HEMOGLOBIN: 8.6 g/dL — AB (ref 13.0–17.0)
MCH: 22.7 pg — ABNORMAL LOW (ref 26.0–34.0)
MCHC: 31.6 g/dL (ref 30.0–36.0)
MCV: 71.8 fL — AB (ref 78.0–100.0)
Platelets: 296 10*3/uL (ref 150–400)
RBC: 3.79 MIL/uL — AB (ref 4.22–5.81)
RDW: 19 % — AB (ref 11.5–15.5)
WBC: 8.7 10*3/uL (ref 4.0–10.5)

## 2016-02-05 LAB — BASIC METABOLIC PANEL
ANION GAP: 9 (ref 5–15)
BUN: 75 mg/dL — AB (ref 6–20)
CHLORIDE: 105 mmol/L (ref 101–111)
CO2: 25 mmol/L (ref 22–32)
Calcium: 8.5 mg/dL — ABNORMAL LOW (ref 8.9–10.3)
Creatinine, Ser: 8.54 mg/dL — ABNORMAL HIGH (ref 0.61–1.24)
GFR, EST AFRICAN AMERICAN: 7 mL/min — AB (ref >60)
GFR, EST NON AFRICAN AMERICAN: 6 mL/min — AB (ref >60)
Glucose, Bld: 77 mg/dL (ref 65–99)
POTASSIUM: 4.2 mmol/L (ref 3.5–5.1)
SODIUM: 139 mmol/L (ref 135–145)

## 2016-02-05 LAB — GLUCOSE, CAPILLARY: Glucose-Capillary: 81 mg/dL (ref 65–99)

## 2016-02-05 SURGERY — PARS PLANA VITRECTOMY WITH 25 GAUGE
Anesthesia: General | Site: Eye | Laterality: Right

## 2016-02-05 MED ORDER — ONDANSETRON HCL 4 MG/2ML IJ SOLN
INTRAMUSCULAR | Status: DC | PRN
  Administered 2016-02-05: 4 mg via INTRAVENOUS

## 2016-02-05 MED ORDER — BSS IO SOLN
INTRAOCULAR | Status: DC | PRN
  Administered 2016-02-05: 500 mL via INTRAOCULAR

## 2016-02-05 MED ORDER — BSS IO SOLN
INTRAOCULAR | Status: AC
  Filled 2016-02-05: qty 500

## 2016-02-05 MED ORDER — SODIUM CHLORIDE 0.9 % IV SOLN
INTRAVENOUS | Status: DC | PRN
  Administered 2016-02-05: 07:00:00 via INTRAVENOUS

## 2016-02-05 MED ORDER — NOREPINEPHRINE BITARTRATE 1 MG/ML IV SOLN
0.0000 ug/min | INTRAVENOUS | Status: DC
  Filled 2016-02-05: qty 4

## 2016-02-05 MED ORDER — HYPROMELLOSE (GONIOSCOPIC) 2.5 % OP SOLN
OPHTHALMIC | Status: DC | PRN
  Administered 2016-02-05: 1 [drp] via OPHTHALMIC

## 2016-02-05 MED ORDER — EPINEPHRINE PF 1 MG/ML IJ SOLN
INTRAMUSCULAR | Status: AC
  Filled 2016-02-05: qty 1

## 2016-02-05 MED ORDER — METOCLOPRAMIDE HCL 5 MG/ML IJ SOLN
INTRAMUSCULAR | Status: AC
  Filled 2016-02-05: qty 2

## 2016-02-05 MED ORDER — EPHEDRINE SULFATE 50 MG/ML IJ SOLN
INTRAMUSCULAR | Status: DC | PRN
  Administered 2016-02-05: 10 mg via INTRAVENOUS
  Administered 2016-02-05: 5 mg via INTRAVENOUS

## 2016-02-05 MED ORDER — DEXAMETHASONE SODIUM PHOSPHATE 10 MG/ML IJ SOLN
INTRAMUSCULAR | Status: DC | PRN
  Administered 2016-02-05: 10 mg via INTRAVENOUS

## 2016-02-05 MED ORDER — HYALURONIDASE OVINE 200 UNIT/ML IJ SOLN
150.0000 [IU] | Freq: Once | INTRAMUSCULAR | Status: DC
  Filled 2016-02-05: qty 0.75

## 2016-02-05 MED ORDER — TROPICAMIDE 1 % OP SOLN
1.0000 [drp] | OPHTHALMIC | Status: AC | PRN
  Administered 2016-02-05 (×3): 1 [drp] via OPHTHALMIC
  Filled 2016-02-05: qty 15

## 2016-02-05 MED ORDER — ALBUTEROL SULFATE HFA 108 (90 BASE) MCG/ACT IN AERS
INHALATION_SPRAY | RESPIRATORY_TRACT | Status: DC | PRN
  Administered 2016-02-05: 4 via RESPIRATORY_TRACT

## 2016-02-05 MED ORDER — MIDAZOLAM HCL 2 MG/2ML IJ SOLN
INTRAMUSCULAR | Status: AC
  Filled 2016-02-05: qty 2

## 2016-02-05 MED ORDER — METOCLOPRAMIDE HCL 5 MG/ML IJ SOLN
INTRAMUSCULAR | Status: DC | PRN
  Administered 2016-02-05: 10 mg via INTRAVENOUS

## 2016-02-05 MED ORDER — MIDAZOLAM HCL 5 MG/5ML IJ SOLN
INTRAMUSCULAR | Status: DC | PRN
  Administered 2016-02-05: 2 mg via INTRAVENOUS

## 2016-02-05 MED ORDER — TOBRAMYCIN-DEXAMETHASONE 0.3-0.1 % OP OINT
TOPICAL_OINTMENT | OPHTHALMIC | Status: DC | PRN
  Administered 2016-02-05: 1

## 2016-02-05 MED ORDER — TRIAMCINOLONE ACETONIDE 40 MG/ML IJ SUSP
INTRAMUSCULAR | Status: AC
  Filled 2016-02-05: qty 5

## 2016-02-05 MED ORDER — BSS IO SOLN
INTRAOCULAR | Status: AC
  Filled 2016-02-05: qty 15

## 2016-02-05 MED ORDER — CYCLOPENTOLATE HCL 1 % OP SOLN
1.0000 [drp] | OPHTHALMIC | Status: AC | PRN
  Administered 2016-02-05 (×3): 1 [drp] via OPHTHALMIC
  Filled 2016-02-05: qty 2

## 2016-02-05 MED ORDER — PHENYLEPHRINE HCL 2.5 % OP SOLN
1.0000 [drp] | OPHTHALMIC | Status: AC | PRN
  Administered 2016-02-05 (×3): 1 [drp] via OPHTHALMIC
  Filled 2016-02-05: qty 2

## 2016-02-05 MED ORDER — FENTANYL CITRATE (PF) 100 MCG/2ML IJ SOLN
INTRAMUSCULAR | Status: AC
  Filled 2016-02-05: qty 2

## 2016-02-05 MED ORDER — TETRACAINE HCL 0.5 % OP SOLN
OPHTHALMIC | Status: AC
  Filled 2016-02-05: qty 2

## 2016-02-05 MED ORDER — SUCCINYLCHOLINE CHLORIDE 20 MG/ML IJ SOLN
INTRAMUSCULAR | Status: DC | PRN
  Administered 2016-02-05: 80 mg via INTRAVENOUS

## 2016-02-05 MED ORDER — LIDOCAINE 2% (20 MG/ML) 5 ML SYRINGE
INTRAMUSCULAR | Status: AC
  Filled 2016-02-05: qty 5

## 2016-02-05 MED ORDER — MILRINONE LACTATE IN DEXTROSE 20-5 MG/100ML-% IV SOLN
0.1250 ug/kg/min | INTRAVENOUS | Status: DC
  Administered 2016-02-05: 0.375 ug/kg/min via INTRAVENOUS
  Filled 2016-02-05: qty 100

## 2016-02-05 MED ORDER — PROPOFOL 10 MG/ML IV BOLUS
INTRAVENOUS | Status: DC | PRN
  Administered 2016-02-05: 80 mg via INTRAVENOUS
  Administered 2016-02-05: 100 mg via INTRAVENOUS
  Administered 2016-02-05: 20 mg via INTRAVENOUS

## 2016-02-05 MED ORDER — FENTANYL CITRATE (PF) 100 MCG/2ML IJ SOLN
INTRAMUSCULAR | Status: DC | PRN
  Administered 2016-02-05 (×2): 50 ug via INTRAVENOUS

## 2016-02-05 MED ORDER — LIDOCAINE HCL (CARDIAC) 20 MG/ML IV SOLN
INTRAVENOUS | Status: DC | PRN
  Administered 2016-02-05: 60 mg via INTRATRACHEAL

## 2016-02-05 MED ORDER — SUGAMMADEX SODIUM 200 MG/2ML IV SOLN
INTRAVENOUS | Status: AC
  Filled 2016-02-05: qty 2

## 2016-02-05 MED ORDER — SUGAMMADEX SODIUM 200 MG/2ML IV SOLN
INTRAVENOUS | Status: DC | PRN
  Administered 2016-02-05: 100 mg via INTRAVENOUS

## 2016-02-05 MED ORDER — LABETALOL HCL 5 MG/ML IV SOLN
INTRAVENOUS | Status: DC | PRN
  Administered 2016-02-05: 5 mg via INTRAVENOUS

## 2016-02-05 MED ORDER — EPINEPHRINE PF 1 MG/ML IJ SOLN
INTRAMUSCULAR | Status: DC | PRN
  Administered 2016-02-05: .3 mL

## 2016-02-05 MED ORDER — TRIAMCINOLONE ACETONIDE 40 MG/ML IJ SUSP
INTRAMUSCULAR | Status: DC | PRN
  Administered 2016-02-05: 40 mg via INTRAMUSCULAR

## 2016-02-05 MED ORDER — ONDANSETRON HCL 4 MG/2ML IJ SOLN
INTRAMUSCULAR | Status: AC
  Filled 2016-02-05: qty 2

## 2016-02-05 MED ORDER — BUPIVACAINE HCL (PF) 0.75 % IJ SOLN
INTRAMUSCULAR | Status: DC | PRN
  Administered 2016-02-05: 20 mL via RETROBULBAR

## 2016-02-05 MED ORDER — DEXAMETHASONE SODIUM PHOSPHATE 10 MG/ML IJ SOLN
INTRAMUSCULAR | Status: AC
  Filled 2016-02-05: qty 1

## 2016-02-05 MED ORDER — BUPIVACAINE HCL (PF) 0.75 % IJ SOLN
INTRAMUSCULAR | Status: AC
  Filled 2016-02-05: qty 10

## 2016-02-05 MED ORDER — BSS PLUS IO SOLN
INTRAOCULAR | Status: AC
  Filled 2016-02-05: qty 500

## 2016-02-05 MED ORDER — ROCURONIUM BROMIDE 100 MG/10ML IV SOLN
INTRAVENOUS | Status: DC | PRN
  Administered 2016-02-05: 20 mg via INTRAVENOUS

## 2016-02-05 MED ORDER — TETRACAINE HCL 0.5 % OP SOLN
OPHTHALMIC | Status: DC | PRN
  Administered 2016-02-05: 1 [drp]

## 2016-02-05 MED ORDER — TOBRAMYCIN-DEXAMETHASONE 0.3-0.1 % OP OINT
TOPICAL_OINTMENT | OPHTHALMIC | Status: AC
  Filled 2016-02-05: qty 3.5

## 2016-02-05 MED ORDER — LIDOCAINE HCL 2 % IJ SOLN
INTRAMUSCULAR | Status: AC
  Filled 2016-02-05: qty 20

## 2016-02-05 MED ORDER — ROCURONIUM BROMIDE 50 MG/5ML IV SOSY
PREFILLED_SYRINGE | INTRAVENOUS | Status: AC
  Filled 2016-02-05: qty 10

## 2016-02-05 MED ORDER — HYPROMELLOSE (GONIOSCOPIC) 2.5 % OP SOLN
OPHTHALMIC | Status: AC
  Filled 2016-02-05: qty 15

## 2016-02-05 SURGICAL SUPPLY — 53 items
APPLICATOR COTTON TIP 6IN STRL (MISCELLANEOUS) ×3 IMPLANT
BLADE MVR KNIFE 20G (BLADE) IMPLANT
CANNULA ANT CHAM MAIN (OPHTHALMIC RELATED) IMPLANT
CANNULA DUAL BORE 23G (CANNULA) IMPLANT
CANNULA DUALBORE 25G (CANNULA) IMPLANT
CANNULA VLV SOFT TIP 25GA (OPHTHALMIC) ×3 IMPLANT
CAUTERY EYE LOW TEMP 1300F FIN (OPHTHALMIC RELATED) IMPLANT
CLOSURE STERI-STRIP 1/2X4 (GAUZE/BANDAGES/DRESSINGS) ×1
CLSR STERI-STRIP ANTIMIC 1/2X4 (GAUZE/BANDAGES/DRESSINGS) ×2 IMPLANT
CORDS BIPOLAR (ELECTRODE) IMPLANT
COVER MAYO STAND STRL (DRAPES) ×3 IMPLANT
DRAPE INCISE 51X51 W/FILM STRL (DRAPES) ×3 IMPLANT
DRAPE PROXIMA HALF (DRAPES) ×3 IMPLANT
DRAPE RETRACTOR (MISCELLANEOUS) ×3 IMPLANT
ERASER HMR WETFIELD 23G BP (MISCELLANEOUS) IMPLANT
FORCEPS ECKARDT ILM 25G SERR (OPHTHALMIC RELATED) IMPLANT
FORCEPS GRIESHABER ILM 25G A (INSTRUMENTS) IMPLANT
GAS AUTO FILL CONSTEL (OPHTHALMIC)
GAS AUTO FILL CONSTELLATION (OPHTHALMIC) IMPLANT
GLOVE BIOGEL PI IND STRL 7.5 (GLOVE) ×1 IMPLANT
GLOVE BIOGEL PI INDICATOR 7.5 (GLOVE) ×2
GOWN STRL REUS W/ TWL LRG LVL3 (GOWN DISPOSABLE) ×1 IMPLANT
GOWN STRL REUS W/TWL LRG LVL3 (GOWN DISPOSABLE) ×2
HANDLE PNEUMATIC FOR CONSTEL (OPHTHALMIC) IMPLANT
KIT BASIN OR (CUSTOM PROCEDURE TRAY) ×3 IMPLANT
KIT ROOM TURNOVER OR (KITS) ×3 IMPLANT
LENS BIOM SUPER VIEW SET DISP (OPHTHALMIC RELATED) ×3 IMPLANT
MICROPICK 25G (MISCELLANEOUS)
NEEDLE 18GX1X1/2 (RX/OR ONLY) (NEEDLE) ×3 IMPLANT
NEEDLE 25GX 5/8IN NON SAFETY (NEEDLE) ×3 IMPLANT
NEEDLE FILTER BLUNT 18X 1/2SAF (NEEDLE) ×4
NEEDLE FILTER BLUNT 18X1 1/2 (NEEDLE) ×2 IMPLANT
NEEDLE HYPO 25GX1X1/2 BEV (NEEDLE) IMPLANT
NEEDLE HYPO 30X.5 LL (NEEDLE) ×6 IMPLANT
NEEDLE RETROBULBAR 25GX1.5 (NEEDLE) ×3 IMPLANT
NS IRRIG 1000ML POUR BTL (IV SOLUTION) ×3 IMPLANT
PACK VITRECTOMY CUSTOM (CUSTOM PROCEDURE TRAY) ×3 IMPLANT
PAD ARMBOARD 7.5X6 YLW CONV (MISCELLANEOUS) ×6 IMPLANT
PAK PIK VITRECTOMY CVS 25GA (OPHTHALMIC) ×3 IMPLANT
PENCIL BIPOLAR 25GA STR DISP (OPHTHALMIC RELATED) IMPLANT
PICK MICROPICK 25G (MISCELLANEOUS) IMPLANT
PROBE LASER ILLUM FLEX CVD 25G (OPHTHALMIC) IMPLANT
ROLLS DENTAL (MISCELLANEOUS) IMPLANT
SCRAPER DIAMOND 25GA (OPHTHALMIC RELATED) IMPLANT
STOPCOCK 4 WAY LG BORE MALE ST (IV SETS) IMPLANT
SUT VICRYL 7 0 TG140 8 (SUTURE) ×3 IMPLANT
SYR 20CC LL (SYRINGE) ×3 IMPLANT
SYR 5ML LL (SYRINGE) ×3 IMPLANT
SYR TB 1ML LUER SLIP (SYRINGE) IMPLANT
SYRINGE 10CC LL (SYRINGE) IMPLANT
TOWEL OR 17X24 6PK STRL BLUE (TOWEL DISPOSABLE) ×6 IMPLANT
WATER STERILE IRR 1000ML POUR (IV SOLUTION) ×3 IMPLANT
WIPE INSTRUMENT VISIWIPE 73X73 (MISCELLANEOUS) ×3 IMPLANT

## 2016-02-05 NOTE — Brief Op Note (Signed)
02/05/2016  9:49 AM  PATIENT:  Austin Kramer  55 y.o. male  PRE-OPERATIVE DIAGNOSIS:  vitreous hemorrhage right eye  POST-OPERATIVE DIAGNOSIS:  vitreous hemorrhage right eye  PROCEDURE:  Procedure(s) with comments: PARS PLANA VITRECTOMY WITH 25 GAUGE with endo laser (Right) - with block  SURGEON:  Surgeon(s) and Role:    * Jalene Mullet, MD - Primary  PHYSICIAN ASSISTANT:   ASSISTANTS: none   ANESTHESIA:   local and general  EBL:  Total I/O In: -  Out: 2 [Blood:2]  BLOOD ADMINISTERED:none  DRAINS: none   LOCAL MEDICATIONS USED:  MARCAINE    and LIDOCAINE   SPECIMEN:  No Specimen  DISPOSITION OF SPECIMEN:  N/A  COUNTS:  YES  TOURNIQUET:  * No tourniquets in log *  DICTATION: .Note written in EPIC  PLAN OF CARE: Admit for overnight observation  PATIENT DISPOSITION:  PACU - guarded condition.   Delay start of Pharmacological VTE agent (>24hrs) due to surgical blood loss or risk of bleeding: N/A

## 2016-02-05 NOTE — Anesthesia Postprocedure Evaluation (Signed)
Anesthesia Post Note  Patient: Oryan Winterton Runquist  Procedure(s) Performed: Procedure(s) (LRB): PARS PLANA VITRECTOMY WITH 25 GAUGE (Right)  Patient location during evaluation: PACU Anesthesia Type: General Level of consciousness: awake and alert Pain management: pain level controlled Vital Signs Assessment: post-procedure vital signs reviewed and stable Respiratory status: spontaneous breathing, nonlabored ventilation and respiratory function stable Cardiovascular status: stable Postop Assessment: no signs of nausea or vomiting Anesthetic complications: no       Last Vitals:  Vitals:   02/05/16 1145 02/05/16 1200  BP: (!) 147/80 (!) 144/75  Pulse: 97 98  Resp: 18 19  Temp:      Last Pain:  Vitals:   02/05/16 0639  TempSrc: Oral                 Elizer Bostic

## 2016-02-05 NOTE — Anesthesia Preprocedure Evaluation (Signed)
Anesthesia Evaluation  Patient identified by MRN, date of birth, ID band Patient awake    Reviewed: Allergy & Precautions, NPO status , Patient's Chart, lab work & pertinent test results  History of Anesthesia Complications Negative for: history of anesthetic complications  Airway Mallampati: II  TM Distance: >3 FB     Dental  (+) Teeth Intact   Pulmonary neg pulmonary ROS,    breath sounds clear to auscultation       Cardiovascular hypertension, Pt. on medications (-) angina+CHF  (-) dysrhythmias  Rhythm:Regular  Severe pHTN, EF 45%, no management for pHTN and actively symptomatic, RV function normal   Neuro/Psych  Neuromuscular disease negative psych ROS   GI/Hepatic Neg liver ROS, GERD  ,  Endo/Other  diabetes, Type 2, Insulin Dependent  Renal/GU CRFRenal disease     Musculoskeletal   Abdominal   Peds  Hematology   Anesthesia Other Findings   Reproductive/Obstetrics                             Anesthesia Physical Anesthesia Plan  ASA: IV  Anesthesia Plan: General   Post-op Pain Management:    Induction: Intravenous  Airway Management Planned: Oral ETT  Additional Equipment: Arterial line  Intra-op Plan:   Post-operative Plan: Extubation in OR and Possible Post-op intubation/ventilation  Informed Consent: I have reviewed the patients History and Physical, chart, labs and discussed the procedure including the risks, benefits and alternatives for the proposed anesthesia with the patient or authorized representative who has indicated his/her understanding and acceptance.   Dental advisory given  Plan Discussed with: CRNA and Surgeon  Anesthesia Plan Comments:         Anesthesia Quick Evaluation

## 2016-02-05 NOTE — Transfer of Care (Signed)
Immediate Anesthesia Transfer of Care Note  Patient: Austin Kramer  Procedure(s) Performed: Procedure(s) with comments: PARS PLANA VITRECTOMY WITH 25 GAUGE with endo laser (Right) - with block  Patient Location: PACU  Anesthesia Type:General  Level of Consciousness: awake, oriented, sedated, patient cooperative and responds to stimulation  Airway & Oxygen Therapy: Patient Spontanous Breathing and Patient connected to nasal cannula oxygen  Post-op Assessment: Report given to RN, Post -op Vital signs reviewed and stable, Patient moving all extremities and Patient moving all extremities X 4  Post vital signs: Reviewed and stable  Last Vitals:  Vitals:   02/05/16 0639  BP: (!) 165/91  Pulse: 100  Resp: 20  Temp: 36.8 C    Last Pain:  Vitals:   02/05/16 0639  TempSrc: Oral      Patients Stated Pain Goal: 5 (38/88/28 0034)  Complications: No apparent anesthesia complications

## 2016-02-05 NOTE — Anesthesia Procedure Notes (Signed)
Procedure Name: Intubation Date/Time: 02/05/2016 8:14 AM Performed by: Jacquiline Doe A Pre-anesthesia Checklist: Patient identified, Emergency Drugs available, Suction available and Patient being monitored Patient Re-evaluated:Patient Re-evaluated prior to inductionOxygen Delivery Method: Circle System Utilized and Circle system utilized Preoxygenation: Pre-oxygenation with 100% oxygen Intubation Type: IV induction and Cricoid Pressure applied Ventilation: Mask ventilation without difficulty Laryngoscope Size: Mac and 4 Grade View: Grade I Tube type: Oral Tube size: 7.5 mm Number of attempts: 1 Airway Equipment and Method: Stylet and Oral airway Placement Confirmation: ETT inserted through vocal cords under direct vision,  positive ETCO2 and breath sounds checked- equal and bilateral Secured at: 24 cm Tube secured with: Tape Dental Injury: Teeth and Oropharynx as per pre-operative assessment

## 2016-02-05 NOTE — H&P (Signed)
Date of examination:  02/05/2016  Indication for surgery: vitreous hemorrhage right eye  Pertinent past medical history:  Past Medical History:  Diagnosis Date  . Cancer ([place])    PROSTATE  . Chronic kidney disease   . CKD (chronic kidney disease) stage 5, GFR less than 15 ml/min (HCC)   . Diabetes mellitus   . ED (erectile dysfunction)   . GERD (gastroesophageal reflux disease)    occasional  . Hyperlipidemia   . Hypertension    [name]  . Neuromuscular disorder ([facility])    neuropathy, "tingling in feet"  . Obesity   . PPD positive, treated   . Pulmonary hypertension    severe PAH by 01/2016 echo    Pertinent ocular history:  Vitreous hemorrhage right eye and proliferative diabetic retinopathy  Pertinent family history:  Family History  Problem Relation Age of Onset  . Diabetes Mother   . Diabetes Father     General:  Healthy appearing patient in no distress.    Eyes:    Acuity OD HM [place]   External: Within normal limits      Anterior segment: Within normal limits      Motility:      Fundus: vitreous hemorrhage OD, PDR with laser scars OS         Impression: Vitreous hemorrhage right eye and proliferative diabetic retinopathy  Plan: Pars plana vitrectomy with endolaser right eye  Royston Cowper

## 2016-02-05 NOTE — Discharge Instructions (Addendum)
Do not sleep on back, sleep on side or upright

## 2016-02-05 NOTE — Op Note (Signed)
Fionn A Nudelman 02/05/2016  Diagnosis: Vitreous hemorrhage and proliferative diabetic retinopathy with hyphema right eye  Procedure: Pars Plana Vitrectomy and Endolaser and anterior chamber washout. Operative Eye:  right eye  Surgeon: Royston Cowper Estimated Blood Loss: minimal Specimens for Pathology:  None Complications: none   The  patient was prepped and draped in the usual fashion for ocular surgery on the  right eye .  A lid speculum was placed.  Infusion line and trocar was placed at the 8 o'clock position approximately 3.5 mm from the surgical limbus.   The infusion line was allowed to run and then clamped when placed at the cannula opening. The line was inserted and secured to the drape with an adhesive strip.   Active trocars/cannula were placed at the 10 and 2 o'clock positions approximately 3.5 mm from the surgical limbus. The cannula was visualized in the vitreous cavity.  The light pipe and vitreous cutter were inserted into the vitreous cavity and a core vitrectomy was performed.  Care taken to remove the vitreous up to the vitreous base for 360 degrees.   A clear corneal wound was made and BSS was used to wash out the hyphema in the anterior chamber.  A partial air-fluid exchange was performed.  The superior cannulas were sequentially removed with concommitant tamponade using a cotton tipped applicator and noted to be air tight.  The infusion line and trocar were removed and the sclerotomy was noted to be air tight with normal intraocular pressure by digital palpapation.  Subconjunctival injections of  Dexamethasone 4mg /12ml was placed in the infero-medial quadrant.   The speculum and drapes were removed and the eye was patched with Polymixin/Bacitracin ophthalmic ointment. An eye shield was placed and the patient was transferred alert and conversant with stable vital signs to the post operative recovery area.  The patient tolerated the procedure well and no  complications were noted.  Royston Cowper MD

## 2016-02-06 ENCOUNTER — Encounter (HOSPITAL_COMMUNITY): Payer: Self-pay | Admitting: Ophthalmology

## 2016-02-09 LAB — GLUCOSE, CAPILLARY
Glucose-Capillary: 106 mg/dL — ABNORMAL HIGH (ref 65–99)
Glucose-Capillary: 184 mg/dL — ABNORMAL HIGH (ref 65–99)

## 2016-02-14 ENCOUNTER — Emergency Department (HOSPITAL_COMMUNITY)
Admission: EM | Admit: 2016-02-14 | Discharge: 2016-02-15 | Disposition: A | Discharge status: Home / Self Care | Payer: BLUE CROSS/BLUE SHIELD | Attending: Emergency Medicine | Admitting: Emergency Medicine

## 2016-02-14 DIAGNOSIS — S0990XA Unspecified injury of head, initial encounter: Secondary | ICD-10-CM | POA: Diagnosis present

## 2016-02-14 DIAGNOSIS — M79622 Pain in left upper arm: Secondary | ICD-10-CM | POA: Diagnosis not present

## 2016-02-14 DIAGNOSIS — E11649 Type 2 diabetes mellitus with hypoglycemia without coma: Secondary | ICD-10-CM | POA: Diagnosis not present

## 2016-02-14 DIAGNOSIS — Z8546 Personal history of malignant neoplasm of prostate: Secondary | ICD-10-CM | POA: Insufficient documentation

## 2016-02-14 DIAGNOSIS — Y999 Unspecified external cause status: Secondary | ICD-10-CM | POA: Insufficient documentation

## 2016-02-14 DIAGNOSIS — E1140 Type 2 diabetes mellitus with diabetic neuropathy, unspecified: Secondary | ICD-10-CM | POA: Diagnosis not present

## 2016-02-14 DIAGNOSIS — E1122 Type 2 diabetes mellitus with diabetic chronic kidney disease: Secondary | ICD-10-CM | POA: Insufficient documentation

## 2016-02-14 DIAGNOSIS — Z794 Long term (current) use of insulin: Secondary | ICD-10-CM | POA: Diagnosis not present

## 2016-02-14 DIAGNOSIS — I120 Hypertensive chronic kidney disease with stage 5 chronic kidney disease or end stage renal disease: Secondary | ICD-10-CM | POA: Insufficient documentation

## 2016-02-14 DIAGNOSIS — S0181XA Laceration without foreign body of other part of head, initial encounter: Secondary | ICD-10-CM | POA: Diagnosis not present

## 2016-02-14 DIAGNOSIS — Y92000 Kitchen of unspecified non-institutional (private) residence as  the place of occurrence of the external cause: Secondary | ICD-10-CM | POA: Diagnosis not present

## 2016-02-14 DIAGNOSIS — W01198A Fall on same level from slipping, tripping and stumbling with subsequent striking against other object, initial encounter: Secondary | ICD-10-CM | POA: Diagnosis not present

## 2016-02-14 DIAGNOSIS — N185 Chronic kidney disease, stage 5: Secondary | ICD-10-CM | POA: Diagnosis not present

## 2016-02-14 DIAGNOSIS — Y93G3 Activity, cooking and baking: Secondary | ICD-10-CM | POA: Diagnosis not present

## 2016-02-14 DIAGNOSIS — S0003XA Contusion of scalp, initial encounter: Secondary | ICD-10-CM | POA: Diagnosis not present

## 2016-02-14 DIAGNOSIS — S4992XA Unspecified injury of left shoulder and upper arm, initial encounter: Secondary | ICD-10-CM | POA: Diagnosis not present

## 2016-02-14 DIAGNOSIS — Z7982 Long term (current) use of aspirin: Secondary | ICD-10-CM | POA: Diagnosis not present

## 2016-02-14 DIAGNOSIS — Z79899 Other long term (current) drug therapy: Secondary | ICD-10-CM | POA: Insufficient documentation

## 2016-02-14 DIAGNOSIS — R0602 Shortness of breath: Secondary | ICD-10-CM | POA: Insufficient documentation

## 2016-02-14 DIAGNOSIS — W19XXXA Unspecified fall, initial encounter: Secondary | ICD-10-CM

## 2016-02-14 DIAGNOSIS — S0001XA Abrasion of scalp, initial encounter: Secondary | ICD-10-CM | POA: Insufficient documentation

## 2016-02-14 DIAGNOSIS — Z23 Encounter for immunization: Secondary | ICD-10-CM | POA: Insufficient documentation

## 2016-02-14 DIAGNOSIS — E162 Hypoglycemia, unspecified: Secondary | ICD-10-CM

## 2016-02-14 DIAGNOSIS — M542 Cervicalgia: Secondary | ICD-10-CM | POA: Diagnosis not present

## 2016-02-14 DIAGNOSIS — S098XXA Other specified injuries of head, initial encounter: Secondary | ICD-10-CM | POA: Diagnosis not present

## 2016-02-14 DIAGNOSIS — M25512 Pain in left shoulder: Secondary | ICD-10-CM | POA: Insufficient documentation

## 2016-02-14 DIAGNOSIS — J90 Pleural effusion, not elsewhere classified: Secondary | ICD-10-CM | POA: Diagnosis not present

## 2016-02-14 DIAGNOSIS — S199XXA Unspecified injury of neck, initial encounter: Secondary | ICD-10-CM | POA: Diagnosis not present

## 2016-02-15 ENCOUNTER — Emergency Department (HOSPITAL_COMMUNITY): Payer: BLUE CROSS/BLUE SHIELD

## 2016-02-15 ENCOUNTER — Encounter (HOSPITAL_COMMUNITY): Payer: Self-pay | Admitting: Emergency Medicine

## 2016-02-15 DIAGNOSIS — M25512 Pain in left shoulder: Secondary | ICD-10-CM | POA: Diagnosis not present

## 2016-02-15 DIAGNOSIS — S4992XA Unspecified injury of left shoulder and upper arm, initial encounter: Secondary | ICD-10-CM | POA: Diagnosis not present

## 2016-02-15 DIAGNOSIS — J90 Pleural effusion, not elsewhere classified: Secondary | ICD-10-CM | POA: Diagnosis not present

## 2016-02-15 DIAGNOSIS — S199XXA Unspecified injury of neck, initial encounter: Secondary | ICD-10-CM | POA: Diagnosis not present

## 2016-02-15 DIAGNOSIS — S0003XA Contusion of scalp, initial encounter: Secondary | ICD-10-CM | POA: Diagnosis not present

## 2016-02-15 LAB — URINALYSIS, ROUTINE W REFLEX MICROSCOPIC
BACTERIA UA: NONE SEEN
Bilirubin Urine: NEGATIVE
Glucose, UA: 150 mg/dL — AB
Ketones, ur: NEGATIVE mg/dL
Leukocytes, UA: NEGATIVE
Nitrite: NEGATIVE
PH: 6 (ref 5.0–8.0)
Protein, ur: >=300 mg/dL — AB
SPECIFIC GRAVITY, URINE: 1.009 (ref 1.005–1.030)

## 2016-02-15 LAB — BASIC METABOLIC PANEL
ANION GAP: 13 (ref 5–15)
BUN: 83 mg/dL — ABNORMAL HIGH (ref 6–20)
CALCIUM: 8.3 mg/dL — AB (ref 8.9–10.3)
CHLORIDE: 101 mmol/L (ref 101–111)
CO2: 19 mmol/L — ABNORMAL LOW (ref 22–32)
CREATININE: 7.83 mg/dL — AB (ref 0.61–1.24)
GFR calc non Af Amer: 7 mL/min — ABNORMAL LOW (ref >60)
GFR, EST AFRICAN AMERICAN: 8 mL/min — AB (ref >60)
Glucose, Bld: 113 mg/dL — ABNORMAL HIGH (ref 65–99)
Potassium: 4.2 mmol/L (ref 3.5–5.1)
SODIUM: 133 mmol/L — AB (ref 135–145)

## 2016-02-15 LAB — CBC WITH DIFFERENTIAL/PLATELET
Basophils Absolute: 0 10*3/uL (ref 0.0–0.1)
Basophils Relative: 0 %
EOS PCT: 0 %
Eosinophils Absolute: 0.1 10*3/uL (ref 0.0–0.7)
HCT: 27 % — ABNORMAL LOW (ref 39.0–52.0)
Hemoglobin: 8.6 g/dL — ABNORMAL LOW (ref 13.0–17.0)
LYMPHS ABS: 0.7 10*3/uL (ref 0.7–4.0)
LYMPHS PCT: 5 %
MCH: 22.8 pg — AB (ref 26.0–34.0)
MCHC: 31.9 g/dL (ref 30.0–36.0)
MCV: 71.4 fL — AB (ref 78.0–100.0)
MONO ABS: 0.6 10*3/uL (ref 0.1–1.0)
MONOS PCT: 4 %
Neutro Abs: 12.6 10*3/uL — ABNORMAL HIGH (ref 1.7–7.7)
Neutrophils Relative %: 91 %
PLATELETS: 343 10*3/uL (ref 150–400)
RBC: 3.78 MIL/uL — AB (ref 4.22–5.81)
RDW: 18.7 % — ABNORMAL HIGH (ref 11.5–15.5)
WBC: 14 10*3/uL — AB (ref 4.0–10.5)

## 2016-02-15 LAB — I-STAT TROPONIN, ED: TROPONIN I, POC: 0.1 ng/mL — AB (ref 0.00–0.08)

## 2016-02-15 LAB — CBG MONITORING, ED
GLUCOSE-CAPILLARY: 117 mg/dL — AB (ref 65–99)
GLUCOSE-CAPILLARY: 144 mg/dL — AB (ref 65–99)
GLUCOSE-CAPILLARY: 151 mg/dL — AB (ref 65–99)
Glucose-Capillary: 43 mg/dL — CL (ref 65–99)
Glucose-Capillary: 52 mg/dL — ABNORMAL LOW (ref 65–99)

## 2016-02-15 LAB — TROPONIN I: TROPONIN I: 0.06 ng/mL — AB (ref <0.03)

## 2016-02-15 LAB — MAGNESIUM: Magnesium: 2.2 mg/dL (ref 1.7–2.4)

## 2016-02-15 MED ORDER — ONDANSETRON HCL 4 MG/2ML IJ SOLN: 4.0000 mg | Freq: Once | INTRAMUSCULAR | Status: DC

## 2016-02-15 MED ORDER — LIDOCAINE-EPINEPHRINE (PF) 2 %-1:200000 IJ SOLN
10.0000 mL | Freq: Once | INTRAMUSCULAR | Status: AC
  Administered 2016-02-15: 10 mL via INTRADERMAL
  Filled 2016-02-15: qty 20

## 2016-02-15 MED ORDER — TETANUS-DIPHTH-ACELL PERTUSSIS 5-2.5-18.5 LF-MCG/0.5 IM SUSP
0.5000 mL | Freq: Once | INTRAMUSCULAR | Status: AC
  Administered 2016-02-15: 0.5 mL via INTRAMUSCULAR
  Filled 2016-02-15: qty 0.5

## 2016-02-15 MED ORDER — BACITRACIN ZINC 500 UNIT/GM EX OINT
TOPICAL_OINTMENT | Freq: Once | CUTANEOUS | Status: AC
  Administered 2016-02-15: 2 via TOPICAL

## 2016-02-15 MED ORDER — HYDROCODONE-ACETAMINOPHEN 5-325 MG PO TABS: 1.0000 | ORAL_TABLET | Freq: Four times a day (QID) | ORAL | 0 refills | Status: DC | PRN

## 2016-02-15 MED ORDER — DEXTROSE 50 % IV SOLN
1.0000 | Freq: Once | INTRAVENOUS | Status: DC
  Filled 2016-02-15: qty 50

## 2016-02-15 MED ORDER — ACETAMINOPHEN 500 MG PO TABS
1000.0000 mg | ORAL_TABLET | Freq: Once | ORAL | Status: AC
  Administered 2016-02-15: 1000 mg via ORAL
  Filled 2016-02-15: qty 2

## 2016-02-15 NOTE — ED Notes (Signed)
Wife poured urine out in sink, pt refusing cath, will try again to use urinal.

## 2016-02-15 NOTE — ED Notes (Signed)
Patient transported to X-ray 

## 2016-02-15 NOTE — ED Provider Notes (Signed)
By signing my name below, I, Austin Kramer, attest that this documentation has been prepared under the direction and in the presence of [place], DO. Electronically signed, Austin Kramer, ED Scribe. 02/15/16. 12:36 AM.  TIME SEEN: 12:24 AM  CHIEF COMPLAINT: Fall  HPI:  HPI Comments: Austin Kramer is a 56 y.o. male, with hx of CKD, DM, hypertension, hyperlipidemia brought in by ambulance, who presents to the Emergency Department s/p a fall that occurred just prior to arrival. Pt fell this evening in his kitchen while cooking. Pt states that he lost his balance and fell onto his hardwood floor, striking his posterior head on the floor. Per EMS, pt's blood sugar was 66 on arrival, normally runs 180's per pt. On arrival pt's CBG was 43. He is currently alert and oriented x4. C-collar currently in place. He currently reports pain to his head, posterior neck and left shoulder that he believes is due to the fall. He is on aspirin but not any other antiplatelets or anticoagulation. Pt dx with CKD but has not yet been to dialysis yet. Has a fistula that has been placed in the left upper extremity. He states that he has felt intermittently short of breath for the past month, no shortness of breath currently. No chest pain or chest discomfort. He states that he felt fine prior to today. Denies recent fever, cough, vomiting, diarrhea, bloody stool, melena, chest pain.  ROS: See HPI Constitutional: no fever  Eyes: no drainage  ENT: no runny nose   Cardiovascular:  no chest pain  Resp: Intermittent SOB 1 month GI: no vomiting GU: no dysuria Integumentary: no rash  Allergy: no hives  Musculoskeletal: no leg swelling  Neurological: no slurred speech ROS otherwise negative  PAST MEDICAL HISTORY/PAST SURGICAL HISTORY:  Past Medical History:  Diagnosis Date  . Cancer ([name])    PROSTATE  . Chronic kidney disease   . CKD (chronic kidney disease) stage 5, GFR less than 15 ml/min (HCC)   . Diabetes  mellitus   . ED (erectile dysfunction)   . GERD (gastroesophageal reflux disease)    occasional  . Hyperlipidemia   . Hypertension    [name]  . Neuromuscular disorder (HCC)    neuropathy, "tingling in feet"  . Obesity   . PPD positive, treated   . Pulmonary hypertension    severe PAH by 01/2016 echo    MEDICATIONS:  Prior to Admission medications   Medication Sig Start Date End Date Taking? Authorizing Provider  amLODipine (NORVASC) 10 MG tablet Take 10 mg by mouth daily.    Historical Provider, MD  aspirin 81 MG tablet Take 81 mg by mouth daily.    Historical Provider, MD  calcitRIOL (ROCALTROL) 0.25 MCG capsule Take 0.25 mcg by mouth daily.    Historical Provider, MD  cloNIDine (CATAPRES) 0.2 MG tablet Take 0.2 mg by mouth 2 (two) times daily.    Historical Provider, MD  ferrous sulfate 325 (65 FE) MG tablet Take 325 mg by mouth daily with breakfast.    Historical Provider, MD  fish oil-omega-3 fatty acids 1000 MG capsule Take 1 g by mouth daily.    Historical Provider, MD  furosemide (LASIX) 80 MG tablet Take 80 mg by mouth 2 (two) times daily.    Historical Provider, MD  hydrochlorothiazide (HYDRODIURIL) 25 MG tablet Take 25 mg by mouth daily.    Historical Provider, MD  insulin detemir (LEVEMIR) 100 UNIT/ML injection Inject 0.5 mLs (50 Units total) into  the skin 2 (two) times daily. 06/12/13   Kinnie Feil, MD  insulin lispro (HUMALOG) 100 UNIT/ML injection Inject 20-25 Units into the skin 2 (two) times daily. For low blood sugar as needed. Sliding scale 04/24/13   Denita Lung, MD  Multiple Vitamin (MULTIVITAMIN WITH MINERALS) TABS Take 1 tablet by mouth daily.    Historical Provider, MD  potassium chloride SA (K-DUR,KLOR-CON) 20 MEQ tablet Take 1 tablet (20 mEq total) by mouth 2 (two) times daily. 11/26/15   Virgel Manifold, MD  rosuvastatin (CRESTOR) 20 MG tablet Take 1 tablet (20 mg total) by mouth daily. 07/24/14   Denita Lung, MD  simvastatin (ZOCOR) 20 MG  tablet Take 20 mg by mouth daily.    Historical Provider, MD    ALLERGIES:  No Known Allergies  SOCIAL HISTORY:  Social History  Substance Use Topics  . Smoking status: Never Smoker  . Smokeless tobacco: Never Used  . Alcohol use No    FAMILY HISTORY: Family History  Problem Relation Age of Onset  . Diabetes Mother   . Diabetes Father     EXAM: BP 175/91 (BP Location: Right Arm)   Pulse 91   Temp 97.5 F (36.4 C) (Oral)   Resp 20   Ht 6' (1.829 m)   Wt 276 lb (125.2 kg)   SpO2 97%   BMI 37.43 kg/m  CONSTITUTIONAL: Alert and oriented x3 and responds appropriately to questions. Well-appearing; well-nourished; GCS 15 HEAD: Normocephalic; large hematoma to posterior scalp with a 6cm and 4cm superficial Abrasions that do not need repair are hemostatic. EYES: Conjunctivae clear, PERRL, EOMI ENT: normal nose; no rhinorrhea; moist mucous membranes; pharynx without lesions noted; no dental injury; no septal hematoma NECK: Supple, no meningismus, no LAD; no step-off or deformity; trachea midline. Diffuse cevical midlien spine tenderness, C-collar in place. CARD: RRR; S1 and S2 appreciated; no murmurs, no clicks, no rubs, no gallops RESP: Normal chest excursion without splinting or tachypnea; breath sounds clear and equal bilaterally; no wheezes, no rhonchi, no rales; no hypoxia or respiratory distress CHEST:  chest wall stable, no crepitus or ecchymosis or deformity, nontender to palpation; no flail chest ABD/GI: Normal bowel sounds; non-distended; soft, non-tender, no rebound, no guarding; no ecchymosis or other lesions noted PELVIS:  stable, nontender to palpation BACK:  The back appears normal and is non-tender to palpation, there is no CVA tenderness; no midline spinal tenderness, step-off or deformity EXT: TTP over left shoulder without bony deformity or loss of fullness. Fistula just proximal to left volar wrist with good thrill and 2+ radial pulse on the left. Normal ROM in  all joints; Otherwise extremities are non-tender to palpation; non-pitting significant lower extremity edema that appears chronic in nature; normal capillary refill; no cyanosis, no bony deformity of patient's extremities, no joint effusion, compartments are soft, extremities are warm and well-perfused, no ecchymosis or lacerations    SKIN: Normal color for age and race; warm NEURO: Moves all extremities equally, sensation to light touch intact diffusely, cranial nerves II through XII intact PSYCH: The patient's mood and manner are appropriate. Grooming and personal hygiene are appropriate.  MEDICAL DECISION MAKING: Patient here for mechanical fall at home. He reports having headache, neck pain and left shoulder pain. Has some superficial abrasions. These do not appear to need sutures at this time. We'll update his tetanus. We'll obtain CT of his head, cervical spine and x-ray of the left shoulder. He is requesting only Tylenol for pain.  Patient reports  feeling imminently short of breath for the past month but no shortness of breath today. No chest pain. Does not appear significantly volume overloaded on exam. Has a history of chronic kidney disease but is not yet on hemodialysis. We'll check labs, chest x-ray, EKG today.  Patient hypoglycemic. States he is on insulin. He is drinking and his blood sugar has improved. He is oriented 3 without neurologic deficit.  ED PROGRESS: Patient's imaging shows no acute abnormality. Labs show mild leukocytosis with left shift which may be reactive. He denies any recent infectious symptoms. He is uremic from his chronic kidney disease but has a lateral or otherwise within normal limits. Normal potassium and magnesium. Troponin mildly elevated but again this is in the setting of chronic kidney disease. He is not having chest pain or shortness of breath at this time. EKG showed slightly prolonged QT interval. We will repeat this. No ischemic abnormality.  I have  cleared his C-spine clinically and remove his c-collar. Plan is to repeat EKG, repeat blood sugar, check urinalysis for signs of infection, ambulate patient. Patient and wife at bedside have been updated with this plan.   4:00 AM  Pt able to ambulate but states he does feel slightly lightheaded and nauseous. No hypotension, chest pain, shortness of breath. No pain at this time. Blood sugar is 151. Will give IV Zofran. He is in the room and eating a cookout burger.  4:30 AM  Pt's repeat EKG has improved. Urine shows hematuria improved to near which is likely secondary to his chronic kidney disease but no infection. He is still hemodynamically stable. I feel he is safe to be discharged home with his family. Discussed head injury return precautions. Recommended they watch his blood sugar very closely for the next 24 hours. They verbalize understanding and are comfortable with this plan.   At this time, I do not feel there is any life-threatening condition present. I have reviewed and discussed all results (EKG, imaging, lab, urine as appropriate) and exam findings with patient/family. I have reviewed nursing notes and appropriate previous records.  I feel the patient is safe to be discharged home without further emergent workup and can continue workup as an outpatient as needed. Discussed usual and customary return precautions. Patient/family verbalize understanding and are comfortable with this plan.  Outpatient follow-up has been provided. All questions have been answered.   EKG Interpretation  Date/Time:  Tuesday February 15 2016 00:08:35 EST Ventricular Rate:  90 PR Interval:    QRS Duration: 101 QT Interval:  419 QTC Calculation: 513 R Axis:   80 Text Interpretation:  Sinus rhythm Low voltage, extremity leads Probable anterolateral infarct, old Prolonged QT interval Confirmed by Leonides Schanz,  DO, Kaydence Menard [phone]) on 02/15/2016 12:35:18 AM       EKG Interpretation  Date/Time:  Tuesday February 15 2016  04:16:25 EST Ventricular Rate:  96 PR Interval:    QRS Duration: 130 QT Interval:  378 QTC Calculation: 478 R Axis:   118 Text Interpretation:  Sinus rhythm Nonspecific intraventricular conduction delay Consider anterior infarct QT interval has improved Confirmed by Meriam Chojnowski,  DO, Madelynne Lasker ([id number]) on 02/15/2016 4:30:26 AM        I personally performed the services described in this documentation, which was scribed in my presence. The recorded information has been reviewed and is accurate.     [place], DO 02/15/16 [phone]

## 2016-02-15 NOTE — ED Notes (Signed)
2 cups of orange juice given per Dr. Leonides Schanz.

## 2016-02-15 NOTE — ED Notes (Signed)
CBG 43. 

## 2016-02-15 NOTE — ED Triage Notes (Signed)
BIB EMS from home, pt fell while cooking. CBG on scene 66, pt normally runs 180's. Given 15g oral glucose, repeat CBG 76. Positive LOC. Hematoma to back of head with lac. Pt A/OX4. VSS. No thinners.

## 2016-02-15 NOTE — ED Notes (Signed)
IV team at bedside 

## 2016-02-16 ENCOUNTER — Ambulatory Visit (INDEPENDENT_AMBULATORY_CARE_PROVIDER_SITE_OTHER): Payer: BLUE CROSS/BLUE SHIELD | Admitting: Physician Assistant

## 2016-02-16 ENCOUNTER — Encounter: Payer: Self-pay | Admitting: Physician Assistant

## 2016-02-16 VITALS — BP 182/94 | HR 111 | Ht 72.0 in | Wt 287.6 lb

## 2016-02-16 DIAGNOSIS — I272 Pulmonary hypertension, unspecified: Secondary | ICD-10-CM | POA: Diagnosis not present

## 2016-02-16 DIAGNOSIS — I10 Essential (primary) hypertension: Secondary | ICD-10-CM

## 2016-02-16 DIAGNOSIS — N185 Chronic kidney disease, stage 5: Secondary | ICD-10-CM | POA: Diagnosis not present

## 2016-02-16 DIAGNOSIS — R0602 Shortness of breath: Secondary | ICD-10-CM

## 2016-02-16 DIAGNOSIS — I429 Cardiomyopathy, unspecified: Secondary | ICD-10-CM | POA: Diagnosis not present

## 2016-02-16 MED ORDER — CARVEDILOL 6.25 MG PO TABS: 6.2500 mg | ORAL_TABLET | Freq: Two times a day (BID) | ORAL | 1 refills | Status: DC

## 2016-02-16 NOTE — Progress Notes (Signed)
Cardiology Office Note    Date:  02/16/2016  ID:  Austin Kramer, DOB [birth year], MRN [id number] PCP:  Wyatt Haste, MD  Cardiologist:  Cecilie Kicks NP reviewed with Dr. Meda Coffee last visit Nephrologist: Dr. Justin Mend   Chief Complaint: shortness of breath  History of Present Illness:  Austin Kramer is a 56 y.o. male with history of DM type 2 (since his 73s), HTN, HLD, CKD stage V, prostate CA, GERD, peripheral neuropathy who presents to f/u testing. He was seen in 12/2015 for DOE, edema, and occasional chest discomfort. 2D Echo 02/02/16 showed moderate LVH, EF 40-45%, diffuse HK, diastolic flattening and systolic flattening, RVH, mild LAE/RAE, mild MR, PASP 38mmHg, elevated CVP. Nuclear stress test 01/2016: EF 42%, no ischemia. Due to his PASP Dr. Meda Coffee recommended further evaluation of PE. CTA was ordered but I do not see this resulted - a message was left but it does not appear he returned the call. Note his Cr is 7-8 and he is not on dialysis yet. He is being evaluated for renal transplant. He has a fistula in place.   He was seen in the ED yesterday for losing his balance and fall in the setting of hypoglycemia. He struck his head. CT did not show any acute intracranial abnormality, just scalp hematoma. Labs revealed minimally elevated troponin (0.1 POC, 0.06 full) but in the setting of CKD with Cr 7.83, BUN 83, K 4.2. He reports continued exertional dyspnea since the summer. It comes and goes. He states he can be at work all day lifting heavy boxes and walking and not have any symptoms at all. At other times he notices dyspnea with simple activities like walking up steps or lying back at night. His blood pressure is quite high today. He states he forgot to take all of his medications today. He denies any chest pain. Right now he is asymptomatic but states if he gets to walking he will feel SOB. No syncope. He has chronic LEE which he states is improved after he takes his fluid pills,  elevates them, and uses compression stockings. He states he continues to urinate frequently. He drinks a lot of water daily. He has noticed progressive increase in weight.   Past Medical History:  Diagnosis Date  . Cardiomyopathy ([place])    a. 2D Echo 02/02/16 showed moderate LVH, EF 40-45%, diffuse HK, diastolic flattening and systolic flattening, RVH, mild LAE/RAE, mild MR, PASP 36mmHg, elevated CVP. - > Nuclear stress test 01/2016: EF 42%, no ischemia.  . CKD (chronic kidney disease) stage 5, GFR less than 15 ml/min (HCC)   . Diabetes mellitus   . ED (erectile dysfunction)   . GERD (gastroesophageal reflux disease)    occasional  . Hyperlipidemia   . Hypertension    [name]  . Obesity   . Peripheral neuropathy (HCC)    neuropathy, "tingling in feet"  . PPD positive, treated   . Prostate cancer ([place])   . Pulmonary hypertension    severe PAH by 01/2016 echo    Past Surgical History:  Procedure Laterality Date  . AMPUTATION  01/26/2012   Procedure: AMPUTATION RAY;  Surgeon: Newt Minion, MD;  Location: [facility];  Service: Orthopedics;  Laterality: Right;  Right foot 2nd ray amputation  . AV FISTULA PLACEMENT Left 10/09/2014   Procedure: CREATION OF LEFT RADIOCEPHALIC ARTERIOVENOUS (AV) FISTULA ;  Surgeon: Serafina Mitchell, MD;  Location: [facility];  Service: Vascular;  Laterality: Left;  . COLONOSCOPY    .  FRACTURE SURGERY     shoulder surgery  . KNEE CARTILAGE SURGERY    . PARS PLANA VITRECTOMY Left 12/17/2012   Procedure: LEFT PARS PLANA VITRECTOMY WITH 25 GAUGE/ENDO LASER;  Surgeon: Hurman Horn, MD;  Location: [facility];  Service: Ophthalmology;  Laterality: Left;  . PARS PLANA VITRECTOMY Right 02/05/2016   Procedure: PARS PLANA VITRECTOMY WITH 25 GAUGE;  Surgeon: Jalene Mullet, MD;  Location: [facility];  Service: Ophthalmology;  Laterality: Right;  with block  . PHOTOCOAGULATION WITH LASER Left 12/17/2012   Procedure: PHOTOCOAGULATION WITH LASER;  Surgeon: Hurman Horn, MD;   Location: [facility];  Service: Ophthalmology;  Laterality: Left;  . PROSTATECTOMY  2011  . VASECTOMY      Current Medications: Current Outpatient Prescriptions  Medication Sig Dispense Refill  . amLODipine (NORVASC) 10 MG tablet Take 10 mg by mouth daily.    Marland Kitchen aspirin 81 MG tablet Take 81 mg by mouth daily.    . calcitRIOL (ROCALTROL) 0.25 MCG capsule Take 0.25 mcg by mouth daily.    . cloNIDine (CATAPRES) 0.2 MG tablet Take 0.2 mg by mouth 2 (two) times daily.    . ferrous sulfate 325 (65 FE) MG tablet Take 325 mg by mouth daily with breakfast.    . fish oil-omega-3 fatty acids 1000 MG capsule Take 1 g by mouth daily.    . furosemide (LASIX) 80 MG tablet Take 80 mg by mouth 2 (two) times daily.    . hydrochlorothiazide (HYDRODIURIL) 25 MG tablet Take 25 mg by mouth daily.    Marland Kitchen HYDROcodone-acetaminophen (NORCO/VICODIN) 5-325 MG tablet Take 1-2 tablets by mouth every 6 (six) hours as needed. 10 tablet 0  . insulin detemir (LEVEMIR) 100 UNIT/ML injection Inject 0.5 mLs (50 Units total) into the skin 2 (two) times daily. 20 mL 0  . insulin lispro (HUMALOG) 100 UNIT/ML injection Inject 20-25 Units into the skin 2 (two) times daily. For low blood sugar as needed. Sliding scale 20 mL 0  . Multiple Vitamin (MULTIVITAMIN WITH MINERALS) TABS Take 1 tablet by mouth daily.    . potassium chloride SA (K-DUR,KLOR-CON) 20 MEQ tablet Take 1 tablet (20 mEq total) by mouth 2 (two) times daily. 10 tablet 0  . rosuvastatin (CRESTOR) 20 MG tablet Take 1 tablet (20 mg total) by mouth daily. 90 tablet 3  . simvastatin (ZOCOR) 20 MG tablet Take 20 mg by mouth daily.     No current facility-administered medications for this visit.      Allergies:   Patient has no known allergies.   Social History   Social History  . Marital status: Married    Spouse name: N/A  . Number of children: 4  . Years of education: N/A   Occupational History  . warehouse    Social History Main Topics  . Smoking status: Never  Smoker  . Smokeless tobacco: Never Used  . Alcohol use No  . Drug use: No  . Sexual activity: Yes   Other Topics Concern  . None   Social History Narrative  . None     Family History:  The patient's family history includes Diabetes in his father and mother.   ROS:   Please see the history of present illness. All other systems are reviewed and otherwise negative.    PHYSICAL EXAM:   VS:  BP (!) 182/94   Pulse (!) 111   Ht 6' (1.829 m)   Wt 287 lb 9.6 oz (130.5 kg)   SpO2 97%  BMI 39.01 kg/m   BMI: Body mass index is 39.01 kg/m. GEN: Well nourished, well developed obese AAM, in no acute distress  HEENT: normocephalic, atraumatic Neck: no JVD, carotid bruits, or masses Cardiac: Reg rhythm, elevated rate; no murmurs, rubs, or gallops, 2+ tight BLE edema  Respiratory:  Decreased BS at bases. No wheezes, rales or rhonchi. Normal work of breathing GI: soft, nontender, nondistended, + BS MS: no deformity or atrophy  Skin: warm and dry, no rash Neuro:  Alert and Oriented x 3, Strength and sensation are intact, follows commands Psych: euthymic mood, full affect  Wt Readings from Last 3 Encounters:  02/16/16 287 lb 9.6 oz (130.5 kg)  02/15/16 276 lb (125.2 kg)  02/05/16 276 lb 14.4 oz (125.6 kg)      Studies/Labs Reviewed:   EKG:  EKG was ordered today and personally reviewed by me and demonstrates sinus tach 112bpm, nonspecific St-T changes, nonacute, prior anterior infarct possible  Recent Labs: 11/25/2015: ALT 14 11/27/2015: B Natriuretic Peptide 439.9 02/15/2016: BUN 83; Creatinine, Ser 7.83; Hemoglobin 8.6; Magnesium 2.2; Platelets 343; Potassium 4.2; Sodium 133   Lipid Panel    Component Value Date/Time   CHOL 171 11/25/2015 1616   TRIG 107 11/25/2015 1616   HDL 37 (L) 11/25/2015 1616   CHOLHDL 4.6 11/25/2015 1616   VLDL 21 11/25/2015 1616   LDLCALC 113 11/25/2015 1616    Additional studies/ records that were reviewed today include: Summarized  above.    ASSESSMENT & PLAN:   1. 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 Given his hypertension and volume overload, I believe he has reached the point at which he is nearing dialysis. I reviewed the patient's case in depth with Dr. Lovena Le. We would recommend initiation of carvedilol with close follow-up this week with nephrology to further discuss dialysis. Our office will be calling to facilitate this. Ultimately he would benefit from a right and left heart catheterization to further evaluate his symptoms but this cannot be performed until he is on dialysis because the contrast will likely push him over the edge to ESRD. Dr. Lovena Le does not feel further evaluation should be pursued of PE at this point as he feels VQ would be equivocal and we cannot perform CTA due to his ESRD. This could be considered if his symptoms do not improve with fluid removal. The chronicity of his symptoms seem more consistent with fluid overload than PE. His borderline elevated pulse is chronic dating back to 2013 in [place]. His pulse ox is 97% on RA and he maintained sats >94% with ambulation. Per d/w MD will also refer to advanced HF team as his severely elevated PA pressure portends a poor prognosis. Stressed importance of low sodium diet and 2L fluid restriction. 2. Cardiomyopathy - as above. Defer R/LHC until established with dialysis. Add carvedilol 6.25mg  BID. 3. ESRD on HD - just got update that patient will see nephrology tomorrow at 1:15. 4. Essential HTN - patient has not taken any of his medications yet so some of the vitals we are seeing may be related to clonidine rebound. Stressed importance of med compliance. Add carvedilol and follow.  Disposition: F/u with nephrology ASAP. Refer to [facility] ASAP.   Medication Adjustments/Labs and Tests Ordered: Current medicines are reviewed at length with the  patient today.  Concerns regarding medicines are outlined above. Medication changes, Labs and Tests ordered today  are summarized above and listed in the Patient Instructions accessible in Encounters.   Raechel Ache PA-C  02/16/2016 4:02 PM    [facility] [address], [place], [place]  [address] Phone: [phone]; Fax: [phone]

## 2016-02-16 NOTE — Patient Instructions (Signed)
Medication Instructions:  1) START COREG 6.25 mg TWICE DAILY  Labwork: None  Testing/Procedures: None  Follow-Up: You have an appointment at [facility], 02/17/16, at 1:15 PM with Jerrye Beavers, [place].  You have been referred to [facility] ASAP.  Any Other Special Instructions Will Be Listed Below (If Applicable).     If you need a refill on your cardiac medications before your next appointment, please call your pharmacy.

## 2016-02-17 DIAGNOSIS — N185 Chronic kidney disease, stage 5: Secondary | ICD-10-CM | POA: Diagnosis not present

## 2016-02-17 DIAGNOSIS — D631 Anemia in chronic kidney disease: Secondary | ICD-10-CM | POA: Diagnosis not present

## 2016-02-17 DIAGNOSIS — N2581 Secondary hyperparathyroidism of renal origin: Secondary | ICD-10-CM | POA: Diagnosis not present

## 2016-02-17 DIAGNOSIS — I120 Hypertensive chronic kidney disease with stage 5 chronic kidney disease or end stage renal disease: Secondary | ICD-10-CM | POA: Diagnosis not present

## 2016-02-21 ENCOUNTER — Encounter: Payer: Self-pay | Admitting: *Deleted

## 2016-02-23 DIAGNOSIS — N185 Chronic kidney disease, stage 5: Secondary | ICD-10-CM | POA: Diagnosis not present

## 2016-02-23 DIAGNOSIS — D631 Anemia in chronic kidney disease: Secondary | ICD-10-CM | POA: Diagnosis not present

## 2016-02-23 DIAGNOSIS — N2581 Secondary hyperparathyroidism of renal origin: Secondary | ICD-10-CM | POA: Diagnosis not present

## 2016-02-25 ENCOUNTER — Telehealth: Payer: Self-pay | Admitting: *Deleted

## 2016-02-25 NOTE — Telephone Encounter (Signed)
Several message were left by the ct department, for Austin Kramer to call and schedule his ct. Ok to  removed the order per [employer].

## 2016-03-21 ENCOUNTER — Telehealth: Payer: Self-pay

## 2016-03-21 ENCOUNTER — Other Ambulatory Visit (HOSPITAL_COMMUNITY): Payer: Self-pay | Admitting: *Deleted

## 2016-03-21 NOTE — Telephone Encounter (Signed)
Per Melina Copa, patient needs to be scheduled with Dr. Meda Coffee or Lisbeth Renshaw (on a day Dr. Meda Coffee is in the office) ASAP as [facility] Clinic states they will not follow the patient.   Attempted to call patient to schedule - no VM is set up.  Will try again later.

## 2016-03-21 NOTE — Telephone Encounter (Signed)
----- Message from Charlie Pitter, PA-C sent at 03/21/2016  1:04 PM EST ----- Regarding: RE: ASAP CHF OV Can you tell me what renal decided to do? I would get him in this week if possible to revisit the plan.   ----- Message ----- From: Theodoro Parma, RN Sent: 03/21/2016  12:41 PM To: Charlie Pitter, PA-C Subject: FW: ASAP CHF OV                                Dayna, please see dialog below. You saw him 1/3 and I covered you. When would you like follow-up? I just don't want him to fall through the cracks.   Thanks!!  Valetta Fuller ----- Message ----- From: Scarlette Calico, RN Sent: 03/20/2016   1:32 PM To: Theodoro Parma, RN Subject: FW: ASAP CHF OV                                See below from Dr Haroldine Laws, we will not be seeing pt so will probably need f/u with you guys at some point if not already scheduled, thanks ----- Message ----- From: Jolaine Artist, MD Sent: 03/18/2016   9:15 PM To: Scarlette Calico, RN Subject: RE: ASAP CHF OV                                We decided that we would not be managing.   ----- Message ----- From: Scarlette Calico, RN Sent: 03/17/2016   3:08 PM To: Jolaine Artist, MD Subject: FW: ASAP CHF OV                                Hey we briefly discussed this pt the other week and you said you would discuss w/Dayna as he is a HD pt, please look at chart and let me know what to do, thanks ----- Message ----- From: Theodoro Parma, RN Sent: 03/14/2016   3:20 PM To: Adolm Joseph, RN Subject: FW: ASAP CHF OV                                Hello! Just following up on this appointment. I didn't see it was ever scheduled.  Thanks so much! ----- Message ----- From: Theodoro Parma, RN Sent: 02/16/2016   4:59 PM To: Adolm Joseph, RN, # Subject: ASAP CHF OV                                    This patient is pretty sick and Lisbeth Renshaw is trying to keep him out of the hospital. He has a crit of 8 (not currently on dialysis)  but also needs a R and L heart cath. I got him in with his kidney doc tomorrow (1/4) to discuss dialysis plan, but Dayna also went ahead and referred to you guys as well. Will you call him with an appointment as soon as you can get him in? Her note is completed as well if you need more info. Thanks!  Valetta Fuller

## 2016-03-22 ENCOUNTER — Telehealth: Payer: Self-pay | Admitting: *Deleted

## 2016-03-22 ENCOUNTER — Ambulatory Visit (HOSPITAL_COMMUNITY): Payer: BLUE CROSS/BLUE SHIELD | Attending: Nephrology

## 2016-03-22 NOTE — Telephone Encounter (Signed)
Attempted to call patient to schedule appointment.   No VM is set up- unable to leave message.  To Dr. Francesca Oman nurse for follow-up.

## 2016-03-22 NOTE — Telephone Encounter (Signed)
Will send to [facility] to provide continuous follow-up with getting this pt in to see Dr Meda Coffee or Melina Copa PA-C (same day Dr Meda Coffee is in the office).   I also attempted to call the pt and no answer on both contact numbers provided and no VM set-up.

## 2016-03-22 NOTE — Telephone Encounter (Signed)
Per [facility] scheduling, this pt was contacted to arrange for follow-up with Dr Meda Coffee on tomorrow 2/8 at [place].  Pt denied this appointment telling [facility] scheduler this was too early for him.  Appt also offered to the pt to see Dr Meda Coffee for Friday 2/9 at [place], and pt denied this appt as well.  Pt was also offered an appt to see Melina Copa PA-C on 2/9, same day Dr Meda Coffee is in the office.  Per [facility] scheduling, pt denied this appt offered as well.  Pt informed [place] that he will call back if he accepts appts offered.  [facility] made pt aware that appt may be occupied at that point.  Pt still declined stating he will call back if he wants to schedule appts offered.  Will send this message to [facility], as a general FYI.

## 2016-03-22 NOTE — Telephone Encounter (Signed)
I called Austin Kramer to schedule his with Austin Kramer or Austin Kramer for an asap appointment ,I offered him Thursday or Friday of this week both am appointment, he refused because they were both am appointment and said our times are messed up.patient stated he will call us back.

## 2016-04-07 ENCOUNTER — Encounter: Payer: Self-pay | Admitting: Medical

## 2016-04-07 ENCOUNTER — Ambulatory Visit
Admission: RE | Admit: 2016-04-07 | Discharge: 2016-04-07 | Disposition: A | Discharge status: Home / Self Care | Payer: BLUE CROSS/BLUE SHIELD | Source: Ambulatory Visit | Attending: Medical | Admitting: Medical

## 2016-04-07 ENCOUNTER — Ambulatory Visit (INDEPENDENT_AMBULATORY_CARE_PROVIDER_SITE_OTHER): Payer: BLUE CROSS/BLUE SHIELD | Admitting: Medical

## 2016-04-07 ENCOUNTER — Telehealth: Payer: Self-pay | Admitting: Family Medicine

## 2016-04-07 VITALS — BP 180/90 | HR 86 | Temp 98.7°F | Wt 274.2 lb

## 2016-04-07 DIAGNOSIS — M79641 Pain in right hand: Secondary | ICD-10-CM

## 2016-04-07 DIAGNOSIS — N189 Chronic kidney disease, unspecified: Secondary | ICD-10-CM

## 2016-04-07 DIAGNOSIS — E118 Type 2 diabetes mellitus with unspecified complications: Secondary | ICD-10-CM | POA: Diagnosis not present

## 2016-04-07 DIAGNOSIS — M7989 Other specified soft tissue disorders: Secondary | ICD-10-CM

## 2016-04-07 LAB — CBC
HCT: 29.5 % — ABNORMAL LOW (ref 38.5–50.0)
Hemoglobin: 9.2 g/dL — ABNORMAL LOW (ref 13.2–17.1)
MCH: 23 pg — ABNORMAL LOW (ref 27.0–33.0)
MCHC: 31.2 g/dL — ABNORMAL LOW (ref 32.0–36.0)
MCV: 73.8 fL — ABNORMAL LOW (ref 80.0–100.0)
MPV: 9 fL (ref 7.5–12.5)
Platelets: 322 10*3/uL (ref 140–400)
RBC: 4 MIL/uL — ABNORMAL LOW (ref 4.20–5.80)
RDW: 18.9 % — ABNORMAL HIGH (ref 11.0–15.0)
WBC: 9.1 10*3/uL (ref 4.0–10.5)

## 2016-04-07 LAB — URIC ACID: URIC ACID, SERUM: 9.6 mg/dL — AB (ref 4.0–8.0)

## 2016-04-07 MED ORDER — PREDNISONE 10 MG (21) PO TBPK: ORAL_TABLET | ORAL | 0 refills | Status: DC

## 2016-04-07 MED ORDER — HYDROCODONE-ACETAMINOPHEN 5-325 MG PO TABS: 1.0000 | ORAL_TABLET | Freq: Four times a day (QID) | ORAL | 0 refills | Status: DC | PRN

## 2016-04-07 NOTE — Progress Notes (Signed)
Subjective: Chief Complaint  Patient presents with  . swelling  rt hand    swelling  rt head  started last night    Austin Kramer is a 56yo male with hx/o cardiomyopathy, CKD, DM, Erectile dysfunction, hyperlipidemia, HTN, neuropathy, here for hand swelling and BP is elevated.  Medical Team Wyatt Haste, MD here for primary care Dr. Justin Mend, nephrology Melina Copa, PA at heart care Dr. Chalmers Cater, for diabetes, endocrinology  Here reports right hand aching and swelling x 1 day.  Feels a little warm.  No fever.  No injury, no trauma.   No bite he is aware of.   No prior similar.  Has hx/o left knee pain, but no other joint pain or swelling.  No hx/o gout.  No hx/o rheumatoid disease.  Works as Programmer, systems, [employer], works in Proofreader.  Is not on dialysis yet, but may in the near future.   Had recent iron infusion for anemia.  Recent HgbA1C reportedly 6.8%, sees Dr. Chalmers Cater for endocrinology  Forgot to take his BP medication today, usually home BP readings much better than this.  otherwise has been in his usual state of health.   Past Medical History:  Diagnosis Date  . Cardiomyopathy ([place])    a. 2D Echo 02/02/16 showed moderate LVH, EF 40-45%, diffuse HK, diastolic flattening and systolic flattening, RVH, mild LAE/RAE, mild MR, PASP 2mmHg, elevated CVP. - > Nuclear stress test 01/2016: EF 42%, no ischemia.  . CKD (chronic kidney disease) stage 5, GFR less than 15 ml/min (HCC)   . Diabetes mellitus   . ED (erectile dysfunction)   . GERD (gastroesophageal reflux disease)    occasional  . Hyperlipidemia   . Hypertension    [name]  . Obesity   . Peripheral neuropathy (HCC)    neuropathy, "tingling in feet"  . PPD positive, treated   . Prostate cancer ([place])   . Pulmonary hypertension    severe PAH by 01/2016 echo   Current Outpatient Prescriptions on File Prior to Visit  Medication Sig Dispense Refill  . amLODipine (NORVASC) 10 MG tablet Take 10 mg by mouth  daily.    Marland Kitchen aspirin 81 MG tablet Take 81 mg by mouth daily.    . carvedilol (COREG) 6.25 MG tablet Take 1 tablet (6.25 mg total) by mouth 2 (two) times daily. 60 tablet 1  . cloNIDine (CATAPRES) 0.2 MG tablet Take 0.2 mg by mouth 2 (two) times daily.    . ferrous sulfate 325 (65 FE) MG tablet Take 325 mg by mouth daily with breakfast.    . fish oil-omega-3 fatty acids 1000 MG capsule Take 1 g by mouth daily.    . furosemide (LASIX) 80 MG tablet Take 80 mg by mouth 2 (two) times daily.    . hydrochlorothiazide (HYDRODIURIL) 25 MG tablet Take 25 mg by mouth daily.    . insulin detemir (LEVEMIR) 100 UNIT/ML injection Inject 0.5 mLs (50 Units total) into the skin 2 (two) times daily. 20 mL 0  . insulin lispro (HUMALOG) 100 UNIT/ML injection Inject 20-25 Units into the skin 2 (two) times daily. For low blood sugar as needed. Sliding scale 20 mL 0  . Multiple Vitamin (MULTIVITAMIN WITH MINERALS) TABS Take 1 tablet by mouth daily.    . potassium chloride SA (K-DUR,KLOR-CON) 20 MEQ tablet Take 1 tablet (20 mEq total) by mouth 2 (two) times daily. 10 tablet 0  . rosuvastatin (CRESTOR) 20 MG tablet Take 1 tablet (20 mg  total) by mouth daily. 90 tablet 3  . calcitRIOL (ROCALTROL) 0.25 MCG capsule Take 0.25 mcg by mouth daily.     No current facility-administered medications on file prior to visit.    ROS as in subjective    Objective: BP (!) 180/90   Pulse 86   Wt 274 lb 3.2 oz (124.4 kg)   SpO2 98%   BMI 37.19 kg/m   BP Readings from Last 3 Encounters:  04/07/16 (!) 180/90  02/16/16 (!) 182/94  02/15/16 163/87   Wt Readings from Last 3 Encounters:  04/07/16 274 lb 3.2 oz (124.4 kg)  02/16/16 287 lb 9.6 oz (130.5 kg)  02/15/16 276 lb (125.2 kg)   Gen: wd, wn nad Right wrist is mildly swollen but tender and warm throughout Right hand swollen moderately and tender throughout but more tender over base of metacarpals, there is puffy swelling of right 4th finger at PIP.  He can't fully  supinate due to the pain with right wrist/hand.  Forearm and fingers otherwise nontender, no other swelling or deformity.  Rest of arms bilat unremarkable Ext: no other edema Heart: RRR, normal s1, s2, no murmurs Lungs: clear Ext: 1+ nonpitting LE edema, bilat, chronic per patient     Assessment: Encounter Diagnoses  Name Primary?  . Swelling of right hand Yes  . Right hand pain   . Chronic kidney disease, unspecified CKD stage   . Diabetes mellitus with complication (HCC)      Plan: He has a complicated health history, chronic CHF, recently had Coreg added by cardiology is on Lasix 80mg  BID.  He reported not begin sure whether taking HCTZ or not.  I don't see it listed in his 02/2016 nephrology visit notes.   He certainly has risk factors for gout, infection, or other process, but no hx/o gout, no recent uric acid lab result, no trauma/injury.    Called supervising physical Dr Redmond School who recommended I call hand surgeon given his risks.   Called [facility], waited for on call physician to call back but they didn't return my call after 2 attempts.   Recommended he go to the ED but he declines.    Thus, STAT labs and xray.  Discussed possible differential.  Advised if fever, nausea, worse pain or swelling then go to the ED.  For now can use pain medication below, hand elevation, ice pack 12min BID, rest, and f/u pending xray/labs.  Austin Kramer was seen today for swelling  rt hand.  Diagnoses and all orders for this visit:  Swelling of right hand -     DG Hand Complete Right; Future -     DG Wrist Complete Right; Future -     CBC -     Uric acid  Right hand pain -     DG Hand Complete Right; Future -     DG Wrist Complete Right; Future -     CBC -     Uric acid  Chronic kidney disease, unspecified CKD stage -     DG Hand Complete Right; Future -     DG Wrist Complete Right; Future -     CBC -     Uric acid  Diabetes mellitus with complication (HCC) -      DG Hand Complete Right; Future -     DG Wrist Complete Right; Future -     CBC -     Uric acid  Other orders -  HYDROcodone-acetaminophen (NORCO/VICODIN) 5-325 MG tablet; Take 1-2 tablets by mouth every 6 (six) hours as needed.

## 2016-04-07 NOTE — Telephone Encounter (Signed)
Patient called stating his prednisone wasn't at [facility]. Chart reviewed--he saw Audelia Acton today for hand swelling. Based upon info available in chart, looks like treating for gout flare. Prednisone pack sent to pt's requested pharmacy (which is closed now, but he plans to pick up in the morning.  He declined rx being sent to 24 hour pharmacy tonight).

## 2016-04-10 ENCOUNTER — Other Ambulatory Visit: Payer: Self-pay | Admitting: Medical

## 2016-04-10 MED ORDER — COLCHICINE 0.6 MG PO TABS: ORAL_TABLET | ORAL | 1 refills | Status: DC

## 2016-05-02 DIAGNOSIS — E113512 Type 2 diabetes mellitus with proliferative diabetic retinopathy with macular edema, left eye: Secondary | ICD-10-CM | POA: Diagnosis not present

## 2016-05-02 DIAGNOSIS — E113511 Type 2 diabetes mellitus with proliferative diabetic retinopathy with macular edema, right eye: Secondary | ICD-10-CM | POA: Diagnosis not present

## 2016-05-02 DIAGNOSIS — Z961 Presence of intraocular lens: Secondary | ICD-10-CM | POA: Diagnosis not present

## 2016-05-02 DIAGNOSIS — H35033 Hypertensive retinopathy, bilateral: Secondary | ICD-10-CM | POA: Diagnosis not present

## 2016-05-16 DIAGNOSIS — H25812 Combined forms of age-related cataract, left eye: Secondary | ICD-10-CM | POA: Diagnosis not present

## 2016-05-16 DIAGNOSIS — Z961 Presence of intraocular lens: Secondary | ICD-10-CM | POA: Diagnosis not present

## 2016-05-16 DIAGNOSIS — H4311 Vitreous hemorrhage, right eye: Secondary | ICD-10-CM | POA: Diagnosis not present

## 2016-06-05 ENCOUNTER — Telehealth: Payer: Self-pay | Admitting: Physician Assistant

## 2016-06-05 NOTE — Telephone Encounter (Signed)
New Message    Pt is calling referral never put in for Austin Kramer , he wants to know what is going on, does he still need to see them

## 2016-06-07 ENCOUNTER — Other Ambulatory Visit: Payer: Self-pay

## 2016-06-07 ENCOUNTER — Ambulatory Visit (INDEPENDENT_AMBULATORY_CARE_PROVIDER_SITE_OTHER): Payer: BLUE CROSS/BLUE SHIELD | Admitting: Family Medicine

## 2016-06-07 ENCOUNTER — Encounter: Payer: Self-pay | Admitting: Family Medicine

## 2016-06-07 VITALS — BP 130/70 | HR 90 | Wt 275.0 lb

## 2016-06-07 DIAGNOSIS — N189 Chronic kidney disease, unspecified: Secondary | ICD-10-CM

## 2016-06-07 DIAGNOSIS — M109 Gout, unspecified: Secondary | ICD-10-CM | POA: Diagnosis not present

## 2016-06-07 MED ORDER — COLCHICINE 0.6 MG PO TABS: 0.6000 mg | ORAL_TABLET | Freq: Two times a day (BID) | ORAL | 1 refills | Status: DC

## 2016-06-07 MED ORDER — TRAMADOL HCL 50 MG PO TABS: 50.0000 mg | ORAL_TABLET | Freq: Four times a day (QID) | ORAL | 0 refills | Status: DC | PRN

## 2016-06-07 NOTE — Progress Notes (Signed)
   Subjective:    Patient ID: Austin Kramer, male    DOB: May 16, 1960, 56 y.o.   MRN: [id number]  HPI He is here for evaluation of swelling and slight redness and tenderness to the right wrist area. He has had difficulty with this in the past. He does have underlying kidney disease as well. Has had previous x-rays.   Review of Systems     Objective:   Physical Exam The right hand is red, swollen and tender to palpation over the wrist.       Assessment & Plan:  Acute gout of right hand, unspecified cause - Plan: colchicine 0.6 MG tablet, traMADol (ULTRAM) 50 MG tablet  Chronic kidney disease, unspecified CKD stage We can't use an anti-inflammatory on him so I will therefore get him colchicine and tramadol. He will keep me informed

## 2016-06-07 NOTE — Telephone Encounter (Signed)
Called in tramadol per jcl 

## 2016-06-16 NOTE — Telephone Encounter (Signed)
Pt was never called about this referral from 06-05-16, pt would like to know if he is to follow up here or  have a referral to another provider--pls call

## 2016-06-19 ENCOUNTER — Telehealth: Payer: Self-pay | Admitting: Physician Assistant

## 2016-06-19 NOTE — Telephone Encounter (Signed)
Returned pts call to see if I could be of assistance, left a message for him to call back.

## 2016-06-19 NOTE — Telephone Encounter (Signed)
Attempted to call patient again to schedule an appointment with Melina Copa and left a voicemail for him to call the office back. Called home phone and cell phone.

## 2016-06-19 NOTE — Telephone Encounter (Signed)
New Message   pt verbalized that he is returning call for rn   Per chart review notes pt is due for an appt pt declined first available  5/9 @ 11:30am and wants rn to call

## 2016-06-19 NOTE — Telephone Encounter (Signed)
Attempted to call patient to schedule an appointment for follow up. Unable to leave a voicemail.

## 2016-06-21 DIAGNOSIS — E161 Other hypoglycemia: Secondary | ICD-10-CM | POA: Diagnosis not present

## 2016-06-21 DIAGNOSIS — E162 Hypoglycemia, unspecified: Secondary | ICD-10-CM | POA: Diagnosis not present

## 2016-06-21 NOTE — Telephone Encounter (Signed)
CHF clinic communicated to [place] that they felt volume issue was primarily driven by renal issues, so they recommended primary general cardiology follow-up. Please arrange. Thanks! Alek Poncedeleon PA-C

## 2016-06-22 NOTE — Telephone Encounter (Signed)
-----   Message from Philbert Riser sent at 06/22/2016  2:59 PM EDT ----- Regarding: RE: follow-up appt with Extender on [facility] team Left a message to call and schedule his appointment with an extender. ----- Message ----- From: Nuala Alpha, LPN Sent: 6/43/3295   8:32 AM To: [facility] Subject: follow-up appt with Extender on [facility] team  Below is a message sent from [facility] on pt.  Can you please call and arrange for a follow-up appt with Extender, preferably with her, or someone on [facility] team for follow-up?  HF clinic advised this.  I've tried calling and left a message.   Thanks,   Johnsie Kindred, Nedra Hai, PA-C at 06/21/2016 4:35 PM   Status: Signed    CHF clinic communicated to [place] that they felt volume issue was primarily driven by renal issues, so they recommended primary general cardiology follow-up. Please arrange. Thanks! Dayna Dunn PA-C

## 2016-06-27 ENCOUNTER — Encounter: Payer: Self-pay | Admitting: Nephrology

## 2016-06-28 DIAGNOSIS — I120 Hypertensive chronic kidney disease with stage 5 chronic kidney disease or end stage renal disease: Secondary | ICD-10-CM | POA: Diagnosis not present

## 2016-06-28 DIAGNOSIS — N2581 Secondary hyperparathyroidism of renal origin: Secondary | ICD-10-CM | POA: Diagnosis not present

## 2016-06-28 DIAGNOSIS — N185 Chronic kidney disease, stage 5: Secondary | ICD-10-CM | POA: Diagnosis not present

## 2016-06-28 DIAGNOSIS — D631 Anemia in chronic kidney disease: Secondary | ICD-10-CM | POA: Diagnosis not present

## 2016-07-03 ENCOUNTER — Other Ambulatory Visit (HOSPITAL_COMMUNITY): Payer: Self-pay | Admitting: *Deleted

## 2016-07-04 ENCOUNTER — Ambulatory Visit (HOSPITAL_COMMUNITY): Admission: RE | Admit: 2016-07-04 | Payer: BLUE CROSS/BLUE SHIELD | Source: Ambulatory Visit

## 2016-07-05 ENCOUNTER — Telehealth: Payer: Self-pay | Admitting: Physician Assistant

## 2016-07-05 NOTE — Telephone Encounter (Signed)
Patient calling states that he was ref

## 2016-07-05 NOTE — Telephone Encounter (Signed)
Patient calling states that Austin Kramer Referred him to a doctor but does not know which one and is confused about what's next. Please call to discuss, thanks.

## 2016-07-05 NOTE — Telephone Encounter (Signed)
Attempted to contact pt at phone number listed, message to mailbox is full, unable to leave a message.

## 2016-07-07 NOTE — Telephone Encounter (Signed)
Follow up    Pam I asked if I could schedule pt appt with Dr. Meda Coffee. Dr. Francesca Oman next appt is Sept 21st at 900am. Pt took that appt and asked to be added to the wait list. Is this ok?

## 2016-07-07 NOTE — Telephone Encounter (Signed)
CHF clinic communicated to [place] that they felt volume issue was primarily driven by renal issues, so they recommended primary general cardiology follow-up. Please arrange. Thanks! Melina Copa PA-C     Electronically signed by Charlie Pitter, PA-C at 06/21/2016 4:35 PM

## 2016-07-07 NOTE — Telephone Encounter (Signed)
Follow up   Pt is returning call    

## 2016-07-07 NOTE — Telephone Encounter (Signed)
Pt scheduled with Dayna Dunn PA-C for 6/27 at 1:30 pm.  Pt made aware of appt date and time by  scheduling.

## 2016-07-07 NOTE — Telephone Encounter (Signed)
That is too far out, needs appointment much sooner. In 02/2016 we had requested early f/u with CHF clinic but they recommended he f/u with renal and general cardiology instead. He did not make it back in to see [place] - at his visit 02/2016 he was tachycardic and with high BP so needs to be seen earlier. Navjot Loera PA-C

## 2016-07-07 NOTE — Telephone Encounter (Signed)
Mailbox is full and unable to leave message.

## 2016-07-11 DIAGNOSIS — E113512 Type 2 diabetes mellitus with proliferative diabetic retinopathy with macular edema, left eye: Secondary | ICD-10-CM | POA: Diagnosis not present

## 2016-07-17 ENCOUNTER — Other Ambulatory Visit (HOSPITAL_COMMUNITY): Payer: Self-pay | Admitting: *Deleted

## 2016-07-18 ENCOUNTER — Ambulatory Visit (HOSPITAL_COMMUNITY): Admission: RE | Admit: 2016-07-18 | Payer: BLUE CROSS/BLUE SHIELD | Source: Ambulatory Visit

## 2016-07-22 ENCOUNTER — Other Ambulatory Visit: Payer: Self-pay | Admitting: Family Medicine

## 2016-07-22 DIAGNOSIS — M109 Gout, unspecified: Secondary | ICD-10-CM

## 2016-07-23 ENCOUNTER — Encounter (HOSPITAL_COMMUNITY): Payer: Self-pay | Admitting: *Deleted

## 2016-07-23 ENCOUNTER — Inpatient Hospital Stay (HOSPITAL_COMMUNITY)
Admission: EM | Admit: 2016-07-23 | Discharge: 2016-07-26 | DRG: 683 | Disposition: A | Discharge status: Home / Self Care | Payer: BLUE CROSS/BLUE SHIELD | Attending: Family Medicine | Admitting: Family Medicine

## 2016-07-23 ENCOUNTER — Emergency Department (HOSPITAL_COMMUNITY): Payer: BLUE CROSS/BLUE SHIELD

## 2016-07-23 DIAGNOSIS — M109 Gout, unspecified: Secondary | ICD-10-CM | POA: Diagnosis not present

## 2016-07-23 DIAGNOSIS — I272 Pulmonary hypertension, unspecified: Secondary | ICD-10-CM | POA: Diagnosis not present

## 2016-07-23 DIAGNOSIS — Z992 Dependence on renal dialysis: Secondary | ICD-10-CM

## 2016-07-23 DIAGNOSIS — K219 Gastro-esophageal reflux disease without esophagitis: Secondary | ICD-10-CM | POA: Diagnosis not present

## 2016-07-23 DIAGNOSIS — D649 Anemia, unspecified: Secondary | ICD-10-CM

## 2016-07-23 DIAGNOSIS — Z833 Family history of diabetes mellitus: Secondary | ICD-10-CM

## 2016-07-23 DIAGNOSIS — N184 Chronic kidney disease, stage 4 (severe): Secondary | ICD-10-CM

## 2016-07-23 DIAGNOSIS — I5022 Chronic systolic (congestive) heart failure: Secondary | ICD-10-CM | POA: Diagnosis not present

## 2016-07-23 DIAGNOSIS — R945 Abnormal results of liver function studies: Secondary | ICD-10-CM

## 2016-07-23 DIAGNOSIS — Z8546 Personal history of malignant neoplasm of prostate: Secondary | ICD-10-CM | POA: Diagnosis not present

## 2016-07-23 DIAGNOSIS — Z9079 Acquired absence of other genital organ(s): Secondary | ICD-10-CM

## 2016-07-23 DIAGNOSIS — R0600 Dyspnea, unspecified: Secondary | ICD-10-CM | POA: Diagnosis present

## 2016-07-23 DIAGNOSIS — N179 Acute kidney failure, unspecified: Secondary | ICD-10-CM | POA: Diagnosis not present

## 2016-07-23 DIAGNOSIS — I429 Cardiomyopathy, unspecified: Secondary | ICD-10-CM | POA: Diagnosis not present

## 2016-07-23 DIAGNOSIS — R7989 Other specified abnormal findings of blood chemistry: Secondary | ICD-10-CM

## 2016-07-23 DIAGNOSIS — D631 Anemia in chronic kidney disease: Secondary | ICD-10-CM | POA: Diagnosis not present

## 2016-07-23 DIAGNOSIS — Z7982 Long term (current) use of aspirin: Secondary | ICD-10-CM | POA: Diagnosis not present

## 2016-07-23 DIAGNOSIS — E1122 Type 2 diabetes mellitus with diabetic chronic kidney disease: Secondary | ICD-10-CM | POA: Diagnosis present

## 2016-07-23 DIAGNOSIS — Z794 Long term (current) use of insulin: Secondary | ICD-10-CM

## 2016-07-23 DIAGNOSIS — N186 End stage renal disease: Secondary | ICD-10-CM | POA: Diagnosis not present

## 2016-07-23 DIAGNOSIS — E1142 Type 2 diabetes mellitus with diabetic polyneuropathy: Secondary | ICD-10-CM | POA: Diagnosis present

## 2016-07-23 DIAGNOSIS — E11319 Type 2 diabetes mellitus with unspecified diabetic retinopathy without macular edema: Secondary | ICD-10-CM | POA: Diagnosis present

## 2016-07-23 DIAGNOSIS — E785 Hyperlipidemia, unspecified: Secondary | ICD-10-CM | POA: Diagnosis not present

## 2016-07-23 DIAGNOSIS — I120 Hypertensive chronic kidney disease with stage 5 chronic kidney disease or end stage renal disease: Secondary | ICD-10-CM | POA: Diagnosis not present

## 2016-07-23 DIAGNOSIS — I132 Hypertensive heart and chronic kidney disease with heart failure and with stage 5 chronic kidney disease, or end stage renal disease: Secondary | ICD-10-CM | POA: Diagnosis not present

## 2016-07-23 DIAGNOSIS — I509 Heart failure, unspecified: Secondary | ICD-10-CM | POA: Diagnosis not present

## 2016-07-23 DIAGNOSIS — K761 Chronic passive congestion of liver: Secondary | ICD-10-CM | POA: Diagnosis present

## 2016-07-23 DIAGNOSIS — I129 Hypertensive chronic kidney disease with stage 1 through stage 4 chronic kidney disease, or unspecified chronic kidney disease: Secondary | ICD-10-CM | POA: Diagnosis not present

## 2016-07-23 DIAGNOSIS — N185 Chronic kidney disease, stage 5: Secondary | ICD-10-CM

## 2016-07-23 DIAGNOSIS — E8770 Fluid overload, unspecified: Secondary | ICD-10-CM | POA: Diagnosis not present

## 2016-07-23 DIAGNOSIS — R109 Unspecified abdominal pain: Secondary | ICD-10-CM | POA: Diagnosis present

## 2016-07-23 HISTORY — DX: Dyspnea, unspecified: R06.00

## 2016-07-23 LAB — COMPREHENSIVE METABOLIC PANEL
ALBUMIN: 2.5 g/dL — AB (ref 3.5–5.0)
ALK PHOS: 98 U/L (ref 38–126)
ALT: 666 U/L — ABNORMAL HIGH (ref 17–63)
ANION GAP: 15 (ref 5–15)
AST: 358 U/L — ABNORMAL HIGH (ref 15–41)
BILIRUBIN TOTAL: 0.9 mg/dL (ref 0.3–1.2)
BUN: 115 mg/dL — AB (ref 6–20)
CALCIUM: 7.6 mg/dL — AB (ref 8.9–10.3)
CO2: 19 mmol/L — ABNORMAL LOW (ref 22–32)
Chloride: 97 mmol/L — ABNORMAL LOW (ref 101–111)
Creatinine, Ser: 13.45 mg/dL — ABNORMAL HIGH (ref 0.61–1.24)
GFR calc Af Amer: 4 mL/min — ABNORMAL LOW (ref >60)
GFR, EST NON AFRICAN AMERICAN: 4 mL/min — AB (ref >60)
GLUCOSE: 298 mg/dL — AB (ref 65–99)
Potassium: 4.7 mmol/L (ref 3.5–5.1)
Sodium: 131 mmol/L — ABNORMAL LOW (ref 135–145)
Total Protein: 6.2 g/dL — ABNORMAL LOW (ref 6.5–8.1)

## 2016-07-23 LAB — GLUCOSE, CAPILLARY: Glucose-Capillary: 210 mg/dL — ABNORMAL HIGH (ref 65–99)

## 2016-07-23 LAB — CBC
HCT: 25.4 % — ABNORMAL LOW (ref 39.0–52.0)
Hemoglobin: 7.9 g/dL — ABNORMAL LOW (ref 13.0–17.0)
MCH: 23 pg — ABNORMAL LOW (ref 26.0–34.0)
MCHC: 31.1 g/dL (ref 30.0–36.0)
MCV: 74.1 fL — ABNORMAL LOW (ref 78.0–100.0)
Platelets: 242 10*3/uL (ref 150–400)
RBC: 3.43 MIL/uL — ABNORMAL LOW (ref 4.22–5.81)
RDW: 21.7 % — AB (ref 11.5–15.5)
WBC: 6.6 10*3/uL (ref 4.0–10.5)

## 2016-07-23 LAB — URINALYSIS, ROUTINE W REFLEX MICROSCOPIC
Bilirubin Urine: NEGATIVE
Ketones, ur: NEGATIVE mg/dL
NITRITE: NEGATIVE
PH: 6 (ref 5.0–8.0)
Protein, ur: >=300 mg/dL — AB
SPECIFIC GRAVITY, URINE: 1.016 (ref 1.005–1.030)

## 2016-07-23 LAB — PHOSPHORUS: Phosphorus: 9.2 mg/dL — ABNORMAL HIGH (ref 2.5–4.6)

## 2016-07-23 LAB — LIPASE, BLOOD: Lipase: 54 U/L — ABNORMAL HIGH (ref 11–51)

## 2016-07-23 LAB — CBG MONITORING, ED: Glucose-Capillary: 253 mg/dL — ABNORMAL HIGH (ref 65–99)

## 2016-07-23 LAB — PROTIME-INR
INR: 1.33
Prothrombin Time: 16.6 seconds — ABNORMAL HIGH (ref 11.4–15.2)

## 2016-07-23 MED ORDER — ASPIRIN EC 81 MG PO TBEC
81.0000 mg | DELAYED_RELEASE_TABLET | Freq: Every day | ORAL | Status: DC
  Administered 2016-07-24 – 2016-07-26 (×3): 81 mg via ORAL
  Filled 2016-07-23 (×3): qty 1

## 2016-07-23 MED ORDER — HEPARIN SODIUM (PORCINE) 5000 UNIT/ML IJ SOLN
5000.0000 [IU] | Freq: Three times a day (TID) | INTRAMUSCULAR | Status: DC
  Administered 2016-07-23 – 2016-07-26 (×9): 5000 [IU] via SUBCUTANEOUS
  Filled 2016-07-23 (×9): qty 1

## 2016-07-23 MED ORDER — INSULIN DETEMIR 100 UNIT/ML ~~LOC~~ SOLN
10.0000 [IU] | Freq: Two times a day (BID) | SUBCUTANEOUS | Status: DC
  Administered 2016-07-24 – 2016-07-25 (×3): 10 [IU] via SUBCUTANEOUS
  Filled 2016-07-23 (×7): qty 0.1

## 2016-07-23 MED ORDER — INSULIN ASPART 100 UNIT/ML ~~LOC~~ SOLN
5.0000 [IU] | Freq: Two times a day (BID) | SUBCUTANEOUS | Status: DC
  Administered 2016-07-24 – 2016-07-25 (×3): 5 [IU] via SUBCUTANEOUS

## 2016-07-23 MED ORDER — AMLODIPINE BESYLATE 10 MG PO TABS: 10.0000 mg | ORAL_TABLET | Freq: Every day | ORAL | Status: DC

## 2016-07-23 MED ORDER — FUROSEMIDE 10 MG/ML IJ SOLN
80.0000 mg | Freq: Once | INTRAMUSCULAR | Status: AC
  Administered 2016-07-23: 80 mg via INTRAVENOUS
  Filled 2016-07-23: qty 8

## 2016-07-23 MED ORDER — SODIUM CHLORIDE 0.9% FLUSH: 3.0000 mL | INTRAVENOUS | Status: DC | PRN

## 2016-07-23 MED ORDER — INSULIN ASPART 100 UNIT/ML ~~LOC~~ SOLN
0.0000 [IU] | Freq: Three times a day (TID) | SUBCUTANEOUS | Status: DC
  Administered 2016-07-24: 2 [IU] via SUBCUTANEOUS
  Administered 2016-07-24: 3 [IU] via SUBCUTANEOUS
  Administered 2016-07-25: 2 [IU] via SUBCUTANEOUS
  Administered 2016-07-25: 3 [IU] via SUBCUTANEOUS

## 2016-07-23 MED ORDER — CARVEDILOL 6.25 MG PO TABS
6.2500 mg | ORAL_TABLET | Freq: Two times a day (BID) | ORAL | Status: DC
  Administered 2016-07-24 – 2016-07-26 (×5): 6.25 mg via ORAL
  Filled 2016-07-23 (×5): qty 1

## 2016-07-23 MED ORDER — SODIUM CHLORIDE 0.9% FLUSH
3.0000 mL | Freq: Two times a day (BID) | INTRAVENOUS | Status: DC
  Administered 2016-07-23 – 2016-07-26 (×6): 3 mL via INTRAVENOUS

## 2016-07-23 MED ORDER — COLCHICINE 0.6 MG PO TABS
0.3000 mg | ORAL_TABLET | ORAL | Status: DC
  Administered 2016-07-26: 0.3 mg via ORAL
  Filled 2016-07-23: qty 1

## 2016-07-23 MED ORDER — SODIUM CHLORIDE 0.9 % IV SOLN: 250.0000 mL | INTRAVENOUS | Status: DC | PRN

## 2016-07-23 MED ORDER — CLONIDINE HCL 0.2 MG PO TABS
0.2000 mg | ORAL_TABLET | Freq: Two times a day (BID) | ORAL | Status: DC
Start: 2016-07-23 — End: 2016-07-24
  Administered 2016-07-23: 0.2 mg via ORAL
  Filled 2016-07-23: qty 1

## 2016-07-23 MED ORDER — CALCITRIOL 0.25 MCG PO CAPS
0.2500 ug | ORAL_CAPSULE | Freq: Every day | ORAL | Status: DC
Start: 2016-07-24 — End: 2016-07-24

## 2016-07-23 NOTE — ED Provider Notes (Signed)
[facility] DEPT Provider Note   CSN: [id number] Arrival date & time: 07/23/16  1621     History   Chief Complaint Chief Complaint  Patient presents with  . Abdominal Pain    HPI Austin Kramer is a 56 y.o. male.  HPI  Patient is a 56 year old male with past medical history significant for CKD stage V, DM, HTN, cardiomyopathy, who presents to the emergency department with a 2 week history of progressively worsening abdominal pain associated with hemoptysis. Last episode of hemoptysis 4 days ago, dark red blood, no coffee ground emesis. Denies melena or blood in his stool. States that he has been told multiple times that he needs to go on dialysis but he was not ready to go on dialysis. When he did travel to [place]. No episode of emesis or diarrhea today. Denies fever or chills. Still makes urine, no dysuria or hematuria. No prior history of nephrolithiasis. Denies alcohol use. Recently on colchicine and prednisone for gout attack. No prior history of gastritis or peptic ulcer disease.  Past Medical History:  Diagnosis Date  . Cardiomyopathy ([place])    a. 2D Echo 02/02/16 showed moderate LVH, EF 40-45%, diffuse HK, diastolic flattening and systolic flattening, RVH, mild LAE/RAE, mild MR, PASP 70mmHg, elevated CVP. - > Nuclear stress test 01/2016: EF 42%, no ischemia.  . CKD (chronic kidney disease) stage 5, GFR less than 15 ml/min (HCC)   . Diabetes mellitus   . ED (erectile dysfunction)   . GERD (gastroesophageal reflux disease)    occasional  . Hyperlipidemia   . Hypertension    [name]  . Obesity   . Peripheral neuropathy    neuropathy, "tingling in feet"  . PPD positive, treated   . Prostate cancer ([place])   . Pulmonary hypertension ([place])    severe PAH by 01/2016 echo    Patient Active Problem List   Diagnosis Date Noted  . Dyspnea 07/23/2016  . AKI (acute kidney injury) ([place]) 07/23/2016  . Elevated LFTs   . Chronic kidney disease 04/07/2016  .  Right hand pain 04/07/2016  . Swelling of right hand 04/07/2016  . Ureteritis 06/10/2013  . Renal failure 06/10/2013  . Diabetic retinopathy associated with type 2 diabetes mellitus ([place]) 12/17/2012  . 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 Hyponatremia 01/11/2012  . Hypokalemia 01/11/2012  . Renal insufficiency 01/11/2012  . Cellulitis 01/10/2012  . Leukocytosis 01/10/2012  . Type 2 diabetes mellitus with stage 5 chronic kidney disease not on chronic dialysis, with long-term current use of insulin ([place]) 03/27/2011  . Hypertension associated with diabetes ([place]) 03/27/2011  . Hyperlipidemia LDL goal <70 03/27/2011  . Obesity (BMI 30-39.9) 03/27/2011    Past Surgical History:  Procedure Laterality Date  . AMPUTATION  01/26/2012   Procedure: AMPUTATION RAY;  Surgeon: Newt Minion, MD;  Location: [facility];  Service: Orthopedics;  Laterality: Right;  Right foot 2nd ray amputation  . AV FISTULA PLACEMENT Left 10/09/2014   Procedure: CREATION OF LEFT RADIOCEPHALIC ARTERIOVENOUS (AV) FISTULA ;  Surgeon: Serafina Mitchell, MD;  Location: [facility];  Service: Vascular;  Laterality: Left;  . COLONOSCOPY    . FRACTURE SURGERY     shoulder surgery  . KNEE CARTILAGE SURGERY    . PARS PLANA VITRECTOMY Left 12/17/2012   Procedure: LEFT PARS PLANA VITRECTOMY WITH 25 GAUGE/ENDO LASER;  Surgeon: Hurman Horn, MD;  Location: [facility];  Service: Ophthalmology;  Laterality: Left;  . PARS PLANA VITRECTOMY Right 02/05/2016  Procedure: PARS PLANA VITRECTOMY WITH 25 GAUGE;  Surgeon: Jalene Mullet, MD;  Location: [facility];  Service: Ophthalmology;  Laterality: Right;  with block  . PHOTOCOAGULATION WITH LASER Left 12/17/2012   Procedure: PHOTOCOAGULATION WITH LASER;  Surgeon: Hurman Horn, MD;  Location: [facility];  Service: Ophthalmology;  Laterality: Left;  . PROSTATECTOMY  2011  . VASECTOMY         Home Medications    Prior to Admission medications   Medication Sig Start Date End Date Taking? Authorizing Provider  amLODipine  (NORVASC) 10 MG tablet Take 10 mg by mouth daily.    [provider]  aspirin 81 MG tablet Take 81 mg by mouth daily.    [provider]  calcitRIOL (ROCALTROL) 0.25 MCG capsule Take 0.25 mcg by mouth daily.    [provider]  carvedilol (COREG) 6.25 MG tablet Take 1 tablet (6.25 mg total) by mouth 2 (two) times daily. 02/16/16   Dunn, Nedra Hai, PA-C  cloNIDine (CATAPRES) 0.2 MG tablet Take 0.2 mg by mouth 2 (two) times daily.    [provider]  colchicine 0.6 MG tablet Take 1 tablet (0.6 mg total) by mouth 2 (two) times daily. 06/07/16   Denita Lung, MD  ferrous sulfate 325 (65 FE) MG tablet Take 325 mg by mouth daily with breakfast.    [provider]  fish oil-omega-3 fatty acids 1000 MG capsule Take 1 g by mouth daily.    [provider]  furosemide (LASIX) 80 MG tablet Take 80 mg by mouth 2 (two) times daily.    [provider]  HYDROcodone-acetaminophen (NORCO/VICODIN) 5-325 MG tablet Take 1-2 tablets by mouth every 6 (six) hours as needed. 04/07/16   Tysinger, Camelia Eng, PA-C  insulin detemir (LEVEMIR) 100 UNIT/ML injection Inject 0.5 mLs (50 Units total) into the skin 2 (two) times daily. 06/12/13   Kinnie Feil, MD  insulin lispro (HUMALOG) 100 UNIT/ML injection Inject 20-25 Units into the skin 2 (two) times daily. For low blood sugar as needed. Sliding scale 04/24/13   Denita Lung, MD  Multiple Vitamin (MULTIVITAMIN WITH MINERALS) TABS Take 1 tablet by mouth daily.    [provider]  potassium chloride SA (K-DUR,KLOR-CON) 20 MEQ tablet Take 1 tablet (20 mEq total) by mouth 2 (two) times daily. 11/26/15   Virgel Manifold, MD  predniSONE (STERAPRED UNI-PAK 21 TAB) 10 MG (21) TBPK tablet Take as directed Patient not taking: Reported on 06/07/2016 04/07/16   Rita Ohara, MD  rosuvastatin (CRESTOR) 20 MG tablet Take 1 tablet (20 mg total) by mouth daily. 07/24/14   Denita Lung, MD  traMADol (ULTRAM) 50 MG tablet Take  1 tablet (50 mg total) by mouth every 6 (six) hours as needed. 06/07/16   Denita Lung, MD    Family History Family History  Problem Relation Age of Onset  . Diabetes Mother   . Diabetes Father     Social History Social History  Substance Use Topics  . Smoking status: Never Smoker  . Smokeless tobacco: Never Used  . Alcohol use No     Allergies   Patient has no known allergies.   Review of Systems Review of Systems  Constitutional: Positive for appetite change and fatigue. Negative for chills and fever.  HENT: Negative for congestion.   Eyes: Negative for visual disturbance.  Respiratory: Negative for chest tightness and shortness of breath.   Cardiovascular: Negative for chest pain and palpitations.  Gastrointestinal: Positive for  abdominal pain, nausea and vomiting. Negative for blood in stool.  Genitourinary: Negative for decreased urine volume, dysuria and hematuria.  Musculoskeletal: Negative for back pain and gait problem.  Skin: Negative for rash.  Neurological: Positive for weakness ( Generalized). Negative for dizziness, seizures, light-headedness and headaches.  Psychiatric/Behavioral: Negative for behavioral problems and confusion.     Physical Exam Updated Vital Signs BP (!) 163/93 (BP Location: Right Arm) Comment: nurse notified  Pulse 68   Temp 98.2 F (36.8 C) (Oral)   Resp 18   Ht 6' (1.829 m)   Wt 126.9 kg (279 lb 11.2 oz)   SpO2 97%   BMI 37.93 kg/m   Physical Exam  Constitutional: He is oriented to person, place, and time. He appears well-developed and well-nourished.  HENT:  Head: Atraumatic.  Mouth/Throat: Oropharynx is clear and moist.  Eyes: Conjunctivae and EOM are normal. Pupils are equal, round, and reactive to light.  Neck: Normal range of motion. Neck supple. No JVD present. No tracheal deviation present.  Cardiovascular: Normal rate, regular rhythm and intact distal pulses.  Exam reveals gallop.   Pulmonary/Chest: Effort  normal and breath sounds normal. No respiratory distress. He has no wheezes.  Faint crackles to lower lung fields bilaterally. No wheezing.  Abdominal: Soft. He exhibits distension. He exhibits no mass. There is tenderness ( Mild tenderness to palpation of right upper quadrant and epigastric region.). There is no rebound and no guarding.  Negative McBurney's point. Negative Murphy sign. No CVA tenderness.  Musculoskeletal: Normal range of motion. He exhibits no edema.  Neurological: He is alert and oriented to person, place, and time.  Skin: Skin is warm.  AV fistula to left upper extremity.  Psychiatric: He has a normal mood and affect.     ED Treatments / Results  Labs (all labs ordered are listed, but only abnormal results are displayed) Labs Reviewed  LIPASE, BLOOD - Abnormal; Notable for the following:       Result Value   Lipase 54 (*)    All other components within normal limits  COMPREHENSIVE METABOLIC PANEL - Abnormal; Notable for the following:    Sodium 131 (*)    Chloride 97 (*)    CO2 19 (*)    Glucose, Bld 298 (*)    BUN 115 (*)    Creatinine, Ser 13.45 (*)    Calcium 7.6 (*)    Total Protein 6.2 (*)    Albumin 2.5 (*)    AST 358 (*)    ALT 666 (*)    GFR calc non Af Amer 4 (*)    GFR calc Af Amer 4 (*)    All other components within normal limits  CBC - Abnormal; Notable for the following:    RBC 3.43 (*)    Hemoglobin 7.9 (*)    HCT 25.4 (*)    MCV 74.1 (*)    MCH 23.0 (*)    RDW 21.7 (*)    All other components within normal limits  URINALYSIS, ROUTINE W REFLEX MICROSCOPIC - Abnormal; Notable for the following:    APPearance CLOUDY (*)    Glucose, UA >=500 (*)    Hgb urine dipstick MODERATE (*)    Protein, ur >=300 (*)    Leukocytes, UA MODERATE (*)    Bacteria, UA RARE (*)    Squamous Epithelial / LPF 0-5 (*)    All other components within normal limits  PROTIME-INR - Abnormal; Notable for the following:    Prothrombin Time  16.6 (*)    All other  components within normal limits  PHOSPHORUS - Abnormal; Notable for the following:    Phosphorus 9.2 (*)    All other components within normal limits  GLUCOSE, CAPILLARY - Abnormal; Notable for the following:    Glucose-Capillary 210 (*)    All other components within normal limits  CBG MONITORING, ED - Abnormal; Notable for the following:    Glucose-Capillary 253 (*)    All other components within normal limits  URINE CULTURE  HEPATITIS B CORE ANTIBODY, TOTAL  HEPATITIS B SURFACE ANTIBODY  IRON AND TIBC  PARATHYROID HORMONE, INTACT (NO CA)  HEMOGLOBIN A1C  CBC  RENAL FUNCTION PANEL  FERRITIN  HIV ANTIBODY (ROUTINE TESTING)  HEPATITIS PANEL, ACUTE  HEPATIC FUNCTION PANEL  HEPATITIS B SURFACE ANTIGEN  I-STAT VENOUS BLOOD GAS, ED    EKG  EKG Interpretation  Date/Time:  Sunday July 23 2016 19:00:15 EDT Ventricular Rate:  91 PR Interval:    QRS Duration: 159 QT Interval:  437 QTC Calculation: 538 R Axis:   -138 Text Interpretation:  Sinus rhythm Probable left atrial enlargement Left ventricular hypertrophy Anterior infarct, old Prolonged QT interval Nonspecific ST and T wave abnormality Confirmed by Nanavati, Ankit ([id number]) on 07/23/2016 8:46:43 PM       Radiology Dg Abdomen Acute W/chest  Result Date: 07/23/2016 CLINICAL DATA:  Hematemesis. Nausea and vomiting for 1 week. Prostate cancer. EXAM: DG ABDOMEN ACUTE W/ 1V CHEST COMPARISON:  None. FINDINGS: Heart is enlarged. Bilateral edema is present. Interstitial and airspace disease is noted at the lung bases bilaterally. Multiple loops of small bowel are noted in the left abdomen. Fluid levels are present without significant distention. The axial skeleton demonstrates degenerative change. IMPRESSION: 1. Cardiomegaly with interstitial and airspace disease suggesting edema and congestive heart failure. 2. Bibasilar airspace disease likely reflects atelectasis. Lower lobe pneumonia is not excluded. 3. Loops of small bowel without  distention in the left abdomen likely reflect a focal ileus. 4. No obstruction or free air. Electronically Signed   By: Christopher  Mattern M.D.   On: 07/23/2016 20:16    Procedures Procedures (including critical care time)  Medications Ordered in ED Medications  carvedilol (COREG) tablet 6.25 mg (not administered)  calcitRIOL (ROCALTROL) capsule 0.25 mcg (not administered)  cloNIDine (CATAPRES) tablet 0.2 mg (0.2 mg Oral Given 07/23/16 2312)  aspirin EC tablet 81 mg (not administered)  amLODipine (NORVASC) tablet 10 mg (not administered)  insulin detemir (LEVEMIR) injection 10 Units (not administered)  insulin aspart (novoLOG) injection 5 Units (not administered)  heparin injection 5,000 Units (5,000 Units Subcutaneous Given 07/23/16 2312)  sodium chloride flush (NS) 0.9 % injection 3 mL (3 mLs Intravenous Given 07/23/16 2313)  sodium chloride flush (NS) 0.9 % injection 3 mL (not administered)  0.9 %  sodium chloride infusion (not administered)  insulin aspart (novoLOG) injection 0-9 Units (not administered)  colchicine tablet 0.3 mg (not administered)  furosemide (LASIX) injection 80 mg (80 mg Intravenous Given 07/23/16 2312)     Initial Impression / Assessment and Plan / ED Course  I have reviewed the triage vital signs and the nursing notes.  Pertinent labs & imaging results that were available during my care of the patient were reviewed by me and considered in my medical decision making (see chart for details).     56  year old with CKD V, DM, pulmonary hypertension, cardiomyopathy, who presents to the emergency department with generalized abdominal pain and hematemesis. Chronically ill appearing. Afebrile, hemodynamically stable. Mildly tender  epigastric and right upper quadrant abdominal tenderness. No peritonitis.   Patient most likely with acute on chronic kidney disease, creatinine 13.5 (baseline 7). BUN 1:15. Calcium 7.6. Potassium 4.7. This is likely secondary to progression  of his disease. Patient also with elevated LFTs, this is likely secondary to hepatic congestion in the setting of worsening heart failure and renal disease. Denies alcoholic use. Doubt that hematemesis is secondary to variceal bleed. Hemoglobin 7.9 (baseline 8-9). Platelets 242. INR 1.3.   X-ray of abdomen showed evidence of ileus but no signs of small bowel obstruction. No free air. Patient without peritonitis, doubt perforated peptic ulcer disease. Patient's hematemesis is likely secondary to gastritis from recent prednisone and colchicine use for gout. Patient states that he is amenable for dialysis. Nephrology was consulted for dialysis. Patient admitted for further management. Patient stable for the floor.  Final Clinical Impressions(s) / ED Diagnoses   Final diagnoses:  Acute renal failure superimposed on stage 4 chronic kidney disease, unspecified acute renal failure type [facility])  Elevated LFTs    New Prescriptions Current Discharge Medication List       Nathaniel Man, MD 07/24/16 0009    Varney Biles, MD 07/26/16 1526

## 2016-07-23 NOTE — H&P (Signed)
[facility] Admission History and Physical Service Pager: [phone]  Patient name: Austin Kramer Medical record number: [id number] Date of birth: Jul 12, 1960 Age: 56 y.o. Gender: male  Primary Care Provider: Denita Lung, MD Consultants:  Renal Code Status: Full per discussion on admission  Chief Complaint:  Abdominal pain, nausea, and vomiting  Assessment and Plan: Austin Kramer is a 56 y.o. male presenting with abdominal pain, N/V and worsening renal function. PMH is significant for CKD 5, T2DM, HTN, HLD, cardiomyopathy, pulmonary hypertension, obesity, prostate cancer s/p prostatectomy  AKI on CKD stage 5: concern he may now be ESRD given worsening renal function. Unable to see what his renal function has been most recently, previously baseline creatinine was around 8. Unsure if this was an acute insult, he does not seem dehydrated- if anything he appears volume overloaded on exam. K 4.7, BUN 115. Patient is not altered. No asterixis noted on exam. Does appear volume overloaded with faint crackles in the lung bases bilaterally and 2-3+ pitting edema bilaterally up to the knees. Patient has a AVF in place, per his report placed approximately 6 months ago so it may be mature. No emergent need for dialysis currently. - admit to [facility], attending Dr. Andria Frames - nephrology consulted in the ED, may see patient this evening vs 6/11. - as patient appears volume overloaded, will attempt to diurese with Lasix 80mg  IV.  - continue calcitriol  - will get a renal panel for the AM.   Nausea and vomiting: most likely due to uremia as above. Abdominal x-ray with loops of small bowel without distension in the L abdomen likely an ileus. No evidence of UTI on U/A. Good BS without rebound or guarding. Transaminitis noted on lab work with a normal alkaline phosphatase. Patient denies poor PO intake, in fact he's currently hungry. Current denies N/V. - continue to monitor, will  hopefully improve after improvement in renal function and decrease in BUN  Transaminitis: AST 358, ALT 666. Normal alkaline phosphatase and total bilirubin. 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 Would like to rule out other etiologies - viral hepatitis panel - ferritin  - hold statins - continue to monitor  Abdominal pain: currently attributing to above issues, however odd to have abdominal pain due to uremia (unless due to frequent vomiting). Given location, could consider diverticulitis, however he denies frequent diarrhea. No infectious symptoms currently. Non-surgical abdomen.  - continue to monitor and keep a broad differential.  - would like to avoid contrast if possible given worsening renal function.   Type 2 diabetes: sees Dr. Chalmers Cater. Has been decreasing his dosage due to 15 units of Levemir and 10 units of Humalog due to poor PO intake.  - will decrease Levemir to 10 units BID and Humalog to 5 units BID - sensitive SSI - CBGs - f/u A1c  HTN:  - continue amlodipine, coreg, clonidine  Gout: noted to be on colchicine 0.6mg  BID. This seems very high given renal function. - decrease colchicine to 0.3mg  twice weekly.  Anemia: Hgn 7.9. most likely due to chronic kidney disease. - Iron, TIBC, and ferritin ordered - will most likely need Feraheme  Systolic congestive heart failure: Estimated ejection fraction 40% to 45% with diffuse hypokinesis on echo 01/2016 - continue coreg - holding home Lasix, getting IV lasix - given worsening renal function, will hold potassium  HLD:  - HOLD Crestor  FEN/GI: renal diet with fluid restriction Prophylaxis: heparin [place]  Disposition: pending further work up  History of Present Illness:  Austin Kramer is a 56 y.o. male presenting with abdominal pain.  Patient notes 10 days of left sided abdominal pain. Pain is sharp in nature, intermittent. Notes nausea and vomiting as well. He notes dry heaving.  Has had subjective fevers and  chills. One day of diarrhea. No constipation. No hematochezia / melena. He still has an appetite, but just can't keep anything down. Never had pain like this before. Imodium did not help.  On ROS, he had a cold starting May 25th with cough and congestion. Also noted SOB intermittently in addition to chest pain after that. This has since resolved.  He denies LE swelling initially, but then after it is pointed out on exam he states it is very prominent, even after wearing compression stockings.  No urinary frequency, confusion, or abnormal hand movements.  No known sick contacts He had a fistula placed approximately 6 months ago. Has never had HD before.  He sees Dr. Justin Mend    In the ED, he was noted to have worsening renal function with a SCr of 13.45, BUN, and K of 4.7. AST was elevated to 358 and ALT was elevated to 666 with normal bilirubin and alkaline phosphatase. Renal was consulted and FPTS was asked to admit for further evaluation and management.   Review Of Systems: Per HPI with the following additions:   Review of Systems  Constitutional: Positive for chills, fever and malaise/fatigue. Negative for diaphoresis and weight loss.  HENT: Negative for congestion, ear discharge, sinus pain and sore throat.   Eyes: Negative for blurred vision, photophobia and pain.  Respiratory: Positive for shortness of breath. Negative for cough, hemoptysis, sputum production and wheezing.   Cardiovascular: Positive for orthopnea and leg swelling. Negative for chest pain, palpitations and claudication.  Gastrointestinal: Positive for abdominal pain, nausea and vomiting. Negative for blood in stool, constipation, diarrhea, heartburn and melena.  Genitourinary: Negative for dysuria, frequency and urgency.  Musculoskeletal: Negative for myalgias.  Neurological: Negative for dizziness and headaches.  Psychiatric/Behavioral: Negative for depression and substance abuse.    Patient Active Problem List    Diagnosis Date Noted  . Dyspnea 07/23/2016  . Chronic kidney disease 04/07/2016  . Right hand pain 04/07/2016  . Swelling of right hand 04/07/2016  . Ureteritis 06/10/2013  . Renal failure 06/10/2013  . Diabetic retinopathy associated with type 2 diabetes mellitus ([place]) 12/17/2012  . 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 Hyponatremia 01/11/2012  . Hypokalemia 01/11/2012  . Renal insufficiency 01/11/2012  . Cellulitis 01/10/2012  . Leukocytosis 01/10/2012  . Diabetes mellitus with complication ([place]) 23/55/7322  . Hypertension associated with diabetes ([place]) 03/27/2011  . Hyperlipidemia LDL goal <70 03/27/2011  . Obesity (BMI 30-39.9) 03/27/2011    Past Medical History: Past Medical History:  Diagnosis Date  . Cardiomyopathy ([place])    a. 2D Echo 02/02/16 showed moderate LVH, EF 40-45%, diffuse HK, diastolic flattening and systolic flattening, RVH, mild LAE/RAE, mild MR, PASP 107mmHg, elevated CVP. - > Nuclear stress test 01/2016: EF 42%, no ischemia.  . CKD (chronic kidney disease) stage 5, GFR less than 15 ml/min (HCC)   . Diabetes mellitus   . ED (erectile dysfunction)   . GERD (gastroesophageal reflux disease)    occasional  . Hyperlipidemia   . Hypertension    [name]  . Obesity   . Peripheral neuropathy    neuropathy, "tingling in feet"  . PPD positive, treated   . Prostate cancer ([place])   .  Pulmonary hypertension (HCC)    severe PAH by 01/2016 echo    Past Surgical History: Past Surgical History:  Procedure Laterality Date  . AMPUTATION  01/26/2012   Procedure: AMPUTATION RAY;  Surgeon: Newt Minion, MD;  Location: [facility];  Service: Orthopedics;  Laterality: Right;  Right foot 2nd ray amputation  . AV FISTULA PLACEMENT Left 10/09/2014   Procedure: CREATION OF LEFT RADIOCEPHALIC ARTERIOVENOUS (AV) FISTULA ;  Surgeon: Serafina Mitchell, MD;  Location: [facility];  Service: Vascular;  Laterality: Left;  . COLONOSCOPY    . FRACTURE SURGERY     shoulder surgery  . KNEE  CARTILAGE SURGERY    . PARS PLANA VITRECTOMY Left 12/17/2012   Procedure: LEFT PARS PLANA VITRECTOMY WITH 25 GAUGE/ENDO LASER;  Surgeon: Hurman Horn, MD;  Location: [facility];  Service: Ophthalmology;  Laterality: Left;  . PARS PLANA VITRECTOMY Right 02/05/2016   Procedure: PARS PLANA VITRECTOMY WITH 25 GAUGE;  Surgeon: Jalene Mullet, MD;  Location: [facility];  Service: Ophthalmology;  Laterality: Right;  with block  . PHOTOCOAGULATION WITH LASER Left 12/17/2012   Procedure: PHOTOCOAGULATION WITH LASER;  Surgeon: Hurman Horn, MD;  Location: [facility];  Service: Ophthalmology;  Laterality: Left;  . PROSTATECTOMY  2011  . VASECTOMY      Social History: Social History  Substance Use Topics  . Smoking status: Never Smoker  . Smokeless tobacco: Never Used  . Alcohol use No   Additional social history: denies alcohol and drug use  Please also refer to relevant sections of EMR.  Family History: Family History  Problem Relation Age of Onset  . Diabetes Mother   . Diabetes Father     Allergies and Medications: No Known Allergies No current facility-administered medications on file prior to encounter.    Current Outpatient Prescriptions on File Prior to Encounter  Medication Sig Dispense Refill  . amLODipine (NORVASC) 10 MG tablet Take 10 mg by mouth daily.    Marland Kitchen aspirin 81 MG tablet Take 81 mg by mouth daily.    . calcitRIOL (ROCALTROL) 0.25 MCG capsule Take 0.25 mcg by mouth daily.    . carvedilol (COREG) 6.25 MG tablet Take 1 tablet (6.25 mg total) by mouth 2 (two) times daily. 60 tablet 1  . cloNIDine (CATAPRES) 0.2 MG tablet Take 0.2 mg by mouth 2 (two) times daily.    . colchicine 0.6 MG tablet Take 1 tablet (0.6 mg total) by mouth 2 (two) times daily. 30 tablet 1  . ferrous sulfate 325 (65 FE) MG tablet Take 325 mg by mouth daily with breakfast.    . fish oil-omega-3 fatty acids 1000 MG capsule Take 1 g by mouth daily.    . furosemide (LASIX) 80 MG tablet Take 80 mg by mouth 2 (two)  times daily.    Marland Kitchen HYDROcodone-acetaminophen (NORCO/VICODIN) 5-325 MG tablet Take 1-2 tablets by mouth every 6 (six) hours as needed. 15 tablet 0  . insulin detemir (LEVEMIR) 100 UNIT/ML injection Inject 0.5 mLs (50 Units total) into the skin 2 (two) times daily. 20 mL 0  . insulin lispro (HUMALOG) 100 UNIT/ML injection Inject 20-25 Units into the skin 2 (two) times daily. For low blood sugar as needed. Sliding scale 20 mL 0  . Multiple Vitamin (MULTIVITAMIN WITH MINERALS) TABS Take 1 tablet by mouth daily.    . potassium chloride SA (K-DUR,KLOR-CON) 20 MEQ tablet Take 1 tablet (20 mEq total) by mouth 2 (two) times daily. 10 tablet 0  . predniSONE (STERAPRED  UNI-PAK 21 TAB) 10 MG (21) TBPK tablet Take as directed (Patient not taking: Reported on 06/07/2016) 21 tablet 0  . rosuvastatin (CRESTOR) 20 MG tablet Take 1 tablet (20 mg total) by mouth daily. 90 tablet 3  . traMADol (ULTRAM) 50 MG tablet Take 1 tablet (50 mg total) by mouth every 6 (six) hours as needed. 30 tablet 0    Objective: BP (!) 163/93 (BP Location: Right Arm) Comment: nurse notified  Pulse 68   Temp 98.2 F (36.8 C) (Oral)   Resp 18   Ht 6' (1.829 m)   Wt 279 lb 11.2 oz (126.9 kg)   SpO2 97%   BMI 37.93 kg/m  Exam: General: Lying in bed in NAD. Non-toxic Eyes: Conjunctivae non-injected. Scleral anicteric.  ENTM: Moist mucous membranes. Oropharynx clear. No nasal discharge.  Neck: Supple, no LAD. +JVD Cardiovascular: RRR. No murmurs, rubs, or gallops noted. 2+ pitting edema up to the knees bilaterally. Respiratory: No increased WOB. Faint bibasilar crackles, no wheezes or crackles.  Abdomen: +BS, soft, non-distended, slightly tender to palpation over the left side without rebound or guarding. Possible mild hepatojugular reflux? MSK: Normal bulk and tone noted. AVF in left radial with good thrill and bruit.  Skin: No rashes noted over exposed skin Neuro: A&O x4. No gross neurologic deficits. No asterixis.  Psych:   Appropriate mood and affect.    Labs and Imaging: CBC BMET   Recent Labs Lab 07/23/16 1635  WBC 6.6  HGB 7.9*  HCT 25.4*  PLT 242    Recent Labs Lab 07/23/16 1635  NA 131*  K 4.7  CL 97*  CO2 19*  BUN 115*  CREATININE 13.45*  GLUCOSE 298*  CALCIUM 7.6*     Hepatic Function Panel     Component Value Date/Time   PROT 6.2 (L) 07/23/2016 1635   ALBUMIN 2.5 (L) 07/23/2016 1635   AST 358 (H) 07/23/2016 1635   ALT 666 (H) 07/23/2016 1635   ALKPHOS 98 07/23/2016 1635   BILITOT 0.9 07/23/2016 1635     Archie Patten, MD 07/23/2016, 9:08 PM PGY-3, [place] [facility] pager: [phone], text pages welcome

## 2016-07-23 NOTE — Consult Note (Signed)
Renal Service Consult Note [facility]  Austin Kramer 07/23/2016 Austin Kramer Requesting Physician:  Dr Andria Frames  Reason for Consult:  CKD with N/V HPI: The patient is a 56 y.o. year-old with long history of type 2 DM, HTN and CKD, had AVF placed Aug 2016, followed by Dr Justin Mend at [facility].  Presented to ED with N/V for 2-3 weeks and some abd pain.  BUN is 115 and Cr 13.45 , last creat here was 7.83 in Jan 2018.  Asked to see patient for possible dialysis.    Pt reports getting sick with his wife over [place] day wknd which was about 2 wks ago.  They had n/v for several days then she got better but he didn't.  "Can't keep anything down", +dry heaves, also mild L sided abd pain and epigastric pain.  No diarrhea. +SOB and orthopnea w/ bilat LE swelling.     Prior history + for prostate Ca, HTN, HL, DM2, CM and pulm HTN. ECHO from 2017 showed LVEF 40-45%, RVH, PA peak pressure of 33mm Hg and dilated IVC c/w ^CVP.   Last seen by [facility] surg in Jan 2017, 5 months post AVF, and fistula was ready to use per [facility].    UA today with TNTC wbc's and rbc's, cloudy, prot > 300.  CXR showing IS edema and mild CHF.     ROS  denies CP  no joint pain   no HA  no blurry vision  no rash  no diarrhea  no dysuria  no difficulty voiding  no change in urine color    Past Medical History  Past Medical History:  Diagnosis Date  . Cardiomyopathy ([place])    a. 2D Echo 02/02/16 showed moderate LVH, EF 40-45%, diffuse HK, diastolic flattening and systolic flattening, RVH, mild LAE/RAE, mild MR, PASP 64mmHg, elevated CVP. - > Nuclear stress test 01/2016: EF 42%, no ischemia.  . CKD (chronic kidney disease) stage 5, GFR less than 15 ml/min (HCC)   . Diabetes mellitus   . ED (erectile dysfunction)   . GERD (gastroesophageal reflux disease)    occasional  . Hyperlipidemia   . Hypertension    [name]  . Obesity   . Peripheral neuropathy    neuropathy, "tingling in feet"  . PPD  positive, treated   . Prostate cancer ([employer])   . Pulmonary hypertension (HCC)    severe PAH by 01/2016 echo   Past Surgical History  Past Surgical History:  Procedure Laterality Date  . AMPUTATION  01/26/2012   Procedure: AMPUTATION RAY;  Surgeon: Newt Minion, MD;  Location: [facility];  Service: Orthopedics;  Laterality: Right;  Right foot 2nd ray amputation  . AV FISTULA PLACEMENT Left 10/09/2014   Procedure: CREATION OF LEFT RADIOCEPHALIC ARTERIOVENOUS (AV) FISTULA ;  Surgeon: Serafina Mitchell, MD;  Location: [facility];  Service: Vascular;  Laterality: Left;  . COLONOSCOPY    . FRACTURE SURGERY     shoulder surgery  . KNEE CARTILAGE SURGERY    . PARS PLANA VITRECTOMY Left 12/17/2012   Procedure: LEFT PARS PLANA VITRECTOMY WITH 25 GAUGE/ENDO LASER;  Surgeon: Hurman Horn, MD;  Location: [facility];  Service: Ophthalmology;  Laterality: Left;  . PARS PLANA VITRECTOMY Right 02/05/2016   Procedure: PARS PLANA VITRECTOMY WITH 25 GAUGE;  Surgeon: Jalene Mullet, MD;  Location: [facility];  Service: Ophthalmology;  Laterality: Right;  with block  . PHOTOCOAGULATION WITH LASER Left 12/17/2012   Procedure: PHOTOCOAGULATION WITH LASER;  Surgeon: Hurman Horn, MD;  Location: [facility];  Service: Ophthalmology;  Laterality: Left;  . PROSTATECTOMY  2011  . VASECTOMY     Family History  Family History  Problem Relation Age of Onset  . Diabetes Mother   . Diabetes Father    Social History  reports that he has never smoked. He has never used smokeless tobacco. He reports that he does not drink alcohol or use drugs. Allergies No Known Allergies Home medications Prior to Admission medications   Medication Sig Start Date End Date Taking? Authorizing Provider  amLODipine (NORVASC) 10 MG tablet Take 10 mg by mouth daily.    [provider]  aspirin 81 MG tablet Take 81 mg by mouth daily.    [provider]  calcitRIOL (ROCALTROL) 0.25 MCG capsule Take 0.25 mcg by mouth daily.    [provider]  carvedilol (COREG) 6.25 MG tablet Take 1 tablet (6.25 mg total) by mouth 2 (two) times daily. 02/16/16   Dunn, Nedra Hai, PA-C  cloNIDine (CATAPRES) 0.2 MG tablet Take 0.2 mg by mouth 2 (two) times daily.    [provider]  colchicine 0.6 MG tablet Take 1 tablet (0.6 mg total) by mouth 2 (two) times daily. 06/07/16   Denita Lung, MD  ferrous sulfate 325 (65 FE) MG tablet Take 325 mg by mouth daily with breakfast.    [provider]  fish oil-omega-3 fatty acids 1000 MG capsule Take 1 g by mouth daily.    [provider]  furosemide (LASIX) 80 MG tablet Take 80 mg by mouth 2 (two) times daily.    [provider]  HYDROcodone-acetaminophen (NORCO/VICODIN) 5-325 MG tablet Take 1-2 tablets by mouth every 6 (six) hours as needed. 04/07/16   Tysinger, Camelia Eng, PA-C  insulin detemir (LEVEMIR) 100 UNIT/ML injection Inject 0.5 mLs (50 Units total) into the skin 2 (two) times daily. 06/12/13   Kinnie Feil, MD  insulin lispro (HUMALOG) 100 UNIT/ML injection Inject 20-25 Units into the skin 2 (two) times daily. For low blood sugar as needed. Sliding scale 04/24/13   Denita Lung, MD  Multiple Vitamin (MULTIVITAMIN WITH MINERALS) TABS Take 1 tablet by mouth daily.    [provider]  potassium chloride SA (K-DUR,KLOR-CON) 20 MEQ tablet Take 1 tablet (20 mEq total) by mouth 2 (two) times daily. 11/26/15   Virgel Manifold, MD  predniSONE (STERAPRED UNI-PAK 21 TAB) 10 MG (21) TBPK tablet Take as directed Patient not taking: Reported on 06/07/2016 04/07/16   Rita Ohara, MD  rosuvastatin (CRESTOR) 20 MG tablet Take 1 tablet (20 mg total) by mouth daily. 07/24/14   Denita Lung, MD  traMADol (ULTRAM) 50 MG tablet Take 1 tablet (50 mg total) by mouth every 6 (six) hours as needed. 06/07/16   Denita Lung, MD   Liver Function Tests  Recent Labs Lab 07/23/16 1635  AST 358*  ALT 666*  ALKPHOS 98  BILITOT 0.9  PROT 6.2*  ALBUMIN 2.5*    Recent Labs Lab  07/23/16 1635  LIPASE 54*   CBC  Recent Labs Lab 07/23/16 1635  WBC 6.6  HGB 7.9*  HCT 25.4*  MCV 74.1*  PLT 101   Basic Metabolic Panel  Recent Labs Lab 07/23/16 1635  NA 131*  K 4.7  CL 97*  CO2 19*  GLUCOSE 298*  BUN 115*  CREATININE 13.45*  CALCIUM 7.6*   Iron/TIBC/Ferritin/ %Sat    Component Value Date/Time   IRON  24 (L) 10/26/2015 1557   TIBC 319 10/26/2015 1557   FERRITIN 101 10/26/2015 1557   IRONPCTSAT 8 (L) 10/26/2015 1557    Vitals:   07/23/16 1830 07/23/16 1845 07/23/16 2030 07/23/16 2107  BP: (!) 162/105 (!) 143/69 (!) 150/75 (!) 163/93  Pulse: 93 96 91 68  Resp:   18 18  Temp:    98.2 F (36.8 C)  TempSrc:    Oral  SpO2: 100% 98% 98% 97%  Weight:    126.9 kg (279 lb 11.2 oz)  Height:    6' (1.829 m)   Exam Gen alert, no distress No rash, cyanosis or gangrene Sclera anicteric, throat clear  +JVD , no bruits Chest faint basilar rales bilat, no wheezing RRR no mrg Abd soft ntnd no mass or ascites +bs GU normal male MS no joint effusions or deformity Ext 1-2+ pitting pretib edema / no wounds or ulcers Neuro is alert, Ox 3 , nf, no asterixis    Assessment: 1  CKD stage V, uremia/ N/V 2  Volume overload/ pulm edema 3  HTN 4  DM2 uncont 5  ^LFT's prob hepatic congestion (see echo results 2017) 6  Anemia Hb 7.9 7  CM  8  Prostate cancer by hx 9  Pyuria - ordered urine cx, no fever/ ^wbc   Plan - will need to start dialysis - orders written for HD Monday, use AVF small needles. Have d/w pt and family, they are agreeable to start.    Kelly Splinter MD [employer] pager [phone]   07/23/2016, 9:44 PM

## 2016-07-23 NOTE — ED Notes (Signed)
Patient transported to X-ray 

## 2016-07-23 NOTE — ED Triage Notes (Signed)
The pt is c/o abd pain  With n v for one week and the pt reports that he saw blood in his vomitus  Does not know what color

## 2016-07-23 NOTE — ED Notes (Signed)
ED Provider at bedside. 

## 2016-07-24 DIAGNOSIS — N186 End stage renal disease: Secondary | ICD-10-CM

## 2016-07-24 DIAGNOSIS — Z992 Dependence on renal dialysis: Secondary | ICD-10-CM

## 2016-07-24 DIAGNOSIS — D649 Anemia, unspecified: Secondary | ICD-10-CM

## 2016-07-24 LAB — RENAL FUNCTION PANEL
ALBUMIN: 2.4 g/dL — AB (ref 3.5–5.0)
ANION GAP: 16 — AB (ref 5–15)
BUN: 114 mg/dL — AB (ref 6–20)
CHLORIDE: 95 mmol/L — AB (ref 101–111)
CO2: 19 mmol/L — AB (ref 22–32)
Calcium: 7.8 mg/dL — ABNORMAL LOW (ref 8.9–10.3)
Creatinine, Ser: 13.01 mg/dL — ABNORMAL HIGH (ref 0.61–1.24)
GFR calc Af Amer: 4 mL/min — ABNORMAL LOW (ref >60)
GFR calc non Af Amer: 4 mL/min — ABNORMAL LOW (ref >60)
GLUCOSE: 179 mg/dL — AB (ref 65–99)
POTASSIUM: 4.3 mmol/L (ref 3.5–5.1)
Phosphorus: 9.4 mg/dL — ABNORMAL HIGH (ref 2.5–4.6)
Sodium: 130 mmol/L — ABNORMAL LOW (ref 135–145)

## 2016-07-24 LAB — CBC
HCT: 23.4 % — ABNORMAL LOW (ref 39.0–52.0)
HEMOGLOBIN: 7.4 g/dL — AB (ref 13.0–17.0)
MCH: 23.3 pg — AB (ref 26.0–34.0)
MCHC: 31.6 g/dL (ref 30.0–36.0)
MCV: 73.6 fL — ABNORMAL LOW (ref 78.0–100.0)
Platelets: 228 10*3/uL (ref 150–400)
RBC: 3.18 MIL/uL — AB (ref 4.22–5.81)
RDW: 21.4 % — ABNORMAL HIGH (ref 11.5–15.5)
WBC: 6.1 10*3/uL (ref 4.0–10.5)

## 2016-07-24 LAB — IRON AND TIBC
IRON: 30 ug/dL — AB (ref 45–182)
Iron: 46 ug/dL (ref 45–182)
SATURATION RATIOS: 12 % — AB (ref 17.9–39.5)
Saturation Ratios: 10 % — ABNORMAL LOW (ref 17.9–39.5)
TIBC: 312 ug/dL (ref 250–450)
TIBC: 379 ug/dL (ref 250–450)
UIBC: 282 ug/dL
UIBC: 333 ug/dL

## 2016-07-24 LAB — FERRITIN: FERRITIN: 287 ng/mL (ref 24–336)

## 2016-07-24 LAB — GLUCOSE, CAPILLARY
GLUCOSE-CAPILLARY: 124 mg/dL — AB (ref 65–99)
Glucose-Capillary: 223 mg/dL — ABNORMAL HIGH (ref 65–99)
Glucose-Capillary: 191 mg/dL — ABNORMAL HIGH (ref 65–99)

## 2016-07-24 LAB — MRSA PCR SCREENING: MRSA BY PCR: NEGATIVE

## 2016-07-24 LAB — HIV ANTIBODY (ROUTINE TESTING W REFLEX): HIV Screen 4th Generation wRfx: NONREACTIVE

## 2016-07-24 MED ORDER — RENA-VITE PO TABS
1.0000 | ORAL_TABLET | Freq: Every day | ORAL | Status: DC
  Administered 2016-07-24 – 2016-07-25 (×2): 1 via ORAL
  Filled 2016-07-24 (×2): qty 1

## 2016-07-24 MED ORDER — AMLODIPINE BESYLATE 10 MG PO TABS
10.0000 mg | ORAL_TABLET | Freq: Every day | ORAL | Status: DC
  Administered 2016-07-24 – 2016-07-25 (×2): 10 mg via ORAL
  Filled 2016-07-24 (×2): qty 1

## 2016-07-24 MED ORDER — LIDOCAINE-PRILOCAINE 2.5-2.5 % EX CREA
1.0000 "application " | TOPICAL_CREAM | CUTANEOUS | Status: DC | PRN
  Filled 2016-07-24: qty 5

## 2016-07-24 MED ORDER — SODIUM CHLORIDE 0.9 % IV SOLN: 100.0000 mL | INTRAVENOUS | Status: DC | PRN

## 2016-07-24 MED ORDER — CALCIUM ACETATE (PHOS BINDER) 667 MG PO CAPS
1334.0000 mg | ORAL_CAPSULE | Freq: Three times a day (TID) | ORAL | Status: DC
  Administered 2016-07-24 – 2016-07-26 (×5): 1334 mg via ORAL
  Filled 2016-07-24 (×5): qty 2

## 2016-07-24 MED ORDER — PENTAFLUOROPROP-TETRAFLUOROETH EX AERO: 1.0000 "application " | INHALATION_SPRAY | CUTANEOUS | Status: DC | PRN

## 2016-07-24 MED ORDER — LIDOCAINE HCL (PF) 1 % IJ SOLN
5.0000 mL | INTRAMUSCULAR | Status: DC | PRN
  Filled 2016-07-24: qty 5

## 2016-07-24 MED ORDER — HEPARIN SODIUM (PORCINE) 1000 UNIT/ML DIALYSIS
4500.0000 [IU] | INTRAMUSCULAR | Status: DC | PRN
  Filled 2016-07-24: qty 5

## 2016-07-24 MED ORDER — CALCITRIOL 0.25 MCG PO CAPS
0.5000 ug | ORAL_CAPSULE | ORAL | Status: DC
  Administered 2016-07-24: 0.5 ug via ORAL
  Filled 2016-07-24 (×2): qty 2

## 2016-07-24 MED ORDER — HEPARIN SODIUM (PORCINE) 1000 UNIT/ML DIALYSIS
1000.0000 [IU] | INTRAMUSCULAR | Status: DC | PRN
  Filled 2016-07-24: qty 1

## 2016-07-24 MED ORDER — CLONIDINE HCL 0.1 MG PO TABS
0.1000 mg | ORAL_TABLET | Freq: Two times a day (BID) | ORAL | Status: DC
  Administered 2016-07-24 (×2): 0.1 mg via ORAL
  Filled 2016-07-24 (×3): qty 1

## 2016-07-24 MED ORDER — METOCLOPRAMIDE HCL 10 MG PO TABS: 10.0000 mg | ORAL_TABLET | Freq: Three times a day (TID) | ORAL | Status: DC | PRN

## 2016-07-24 MED ORDER — ALTEPLASE 2 MG IJ SOLR: 2.0000 mg | Freq: Once | INTRAMUSCULAR | Status: DC | PRN

## 2016-07-24 NOTE — Progress Notes (Signed)
Subjective: Interval History: has no complaint.  Objective: Vital signs in last 24 hours: Temp:  [97.5 F (36.4 C)-98.5 F (36.9 C)] 97.5 F (36.4 C) (06/11 0655) Pulse Rate:  [68-97] 95 (06/11 0830) Resp:  [18-20] 18 (06/11 0655) BP: (139-165)/(69-105) 147/82 (06/11 0830) SpO2:  [96 %-100 %] 96 % (06/11 0522) Weight:  [124.7 kg (275 lb)-126.9 kg (279 lb 11.2 oz)] 124.9 kg (275 lb 5.7 oz) (06/11 0655) Weight change:   Intake/Output from previous day: 06/10 0701 - 06/11 0700 In: -  Out: 670 [Urine:670] Intake/Output this shift: No intake/output data recorded.  General appearance: alert, cooperative, no distress, moderately obese and pale Resp: diminished breath sounds bilaterally and rales bibasilar Cardio: S1, S2 normal GI: obese, pos bs, liver down 5 cm Extremities: edema 3+  Gr 2/6 SEM  Lab Results:  Recent Labs  07/23/16 1635 07/24/16 0731  WBC 6.6 6.1  HGB 7.9* 7.4*  HCT 25.4* 23.4*  PLT 242 228   BMET:  Recent Labs  07/23/16 1635 07/24/16 0732  NA 131* 130*  K 4.7 4.3  CL 97* 95*  CO2 19* 19*  GLUCOSE 298* 179*  BUN 115* 114*  CREATININE 13.45* 13.01*  CALCIUM 7.6* 7.8*   No results for input(s): PTH in the last 72 hours. Iron Studies: No results for input(s): IRON, TIBC, TRANSFERRIN, FERRITIN in the last 72 hours.  Studies/Results: Dg Abdomen Acute W/chest  Result Date: 07/23/2016 CLINICAL DATA:  Hematemesis. Nausea and vomiting for 1 week. Prostate cancer. EXAM: DG ABDOMEN ACUTE W/ 1V CHEST COMPARISON:  None. FINDINGS: Heart is enlarged. Bilateral edema is present. Interstitial and airspace disease is noted at the lung bases bilaterally. Multiple loops of small bowel are noted in the left abdomen. Fluid levels are present without significant distention. The axial skeleton demonstrates degenerative change. IMPRESSION: 1. Cardiomegaly with interstitial and airspace disease suggesting edema and congestive heart failure. 2. Bibasilar airspace disease  likely reflects atelectasis. Lower lobe pneumonia is not excluded. 3. Loops of small bowel without distention in the left abdomen likely reflect a focal ileus. 4. No obstruction or free air. Electronically Signed   By: San Morelle M.D.   On: 07/23/2016 20:16    I have reviewed the patient's current medications.  Assessment/Plan: 1 ESRD new 1st HD.  tol well. Lower vol and solute.  2 Anemia need to address 3 HPTH 4 DM  Per primary 5 Obesity needs lower cal 6 HTN lower vol, lower meds P HD, esa, check Fe, PTH     LOS: 1 day   Veleria Barnhardt L 07/24/2016,9:09 AM

## 2016-07-24 NOTE — Progress Notes (Signed)
Went to dialysis by bed in [place].

## 2016-07-24 NOTE — Progress Notes (Signed)
Family Medicine Teaching Service Daily Progress Note Intern Pager: [phone]  Patient name: Austin Kramer Medical record number: [id number] Date of birth: 12-23-1960 Age: 56 y.o. Gender: male  Primary Care Provider: Denita Lung, MD Consultants: Renal Code Status: Full  Pt Overview and Major Events to Date:  6/10 admitted for new ESRD  Assessment and Plan: Austin Kramer is a 56 y.o. male presenting with abdominal pain, N/V and worsening renal function. PMH is significant for CKD 5, T2DM, HTN, HLD, cardiomyopathy, pulmonary hypertension, obesity, prostate cancer s/p prostatectomy  New ESRD:  Previously baseline creatinine was around 8, had AVF placed 6 months ago. Undergoing first HD session today. Phosphate 9.4  - Nephrology following, HD today for first session. - s/p Lasix 80mg  IV yesterday for fluid overload - continue calcitriol and phoslo - monitor renal function panel   Transaminitis: AST 358, ALT 666. Normal alkaline phosphatase and total bilirubin. 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 Would like to rule out other etiologies - viral hepatitis panel pending - ferritin  - hold statins - continue to monitor  Type 2 diabetes: sees Dr. Chalmers Cater. Has been decreasing his dosage due to 15 units of Levemir and 10 units of Humalog due to poor PO intake.  - Levemir 10 units BID and Humalog 5 units BID - sensitive SSI - CBGs - A1c pending  HTN: BP 150/90 this am likely elevated to fluid overload. AT home on amlodipine 10mg  qd, coreg 6.25mg  BID, clonidine 0.2mg  BID - continue amlodipine, coreg, clonidine - monitor  Abdominal pain, resolved: currently attributing to above issues. Patient states occurs in conjunction with nausea. No infectious symptoms currently. Non-surgical abdomen.  - continue to monitor and keep a broad differential.  - would like to avoid contrast if possible given worsening renal function.   Nausea and vomiting, resolved: Likely due to uremia  as above. Eating well this morning. - monitor, will hopefully continue improve after HD  Gout: noted to be on colchicine 0.6mg  BID. This seems very high given renal function. - decrease colchicine to 0.3mg  twice weekly.  Anemia: Hgb 7.4. Most likely due to chronic kidney disease. - Iron, TIBC, and ferritin ordered - will most likely need Feraheme  Systolic congestive heart failure: Estimated EF 40% to 45% with diffuse hypokinesis on echo 01/2016 - continue coreg - holding home Lasix, getting IV lasix - given worsening renal function, will hold potassium  HLD:  - HOLD Crestor for transaminitis  FEN/GI: renal diet with fluid restriction Prophylaxis: heparin Austin Kramer  Disposition: pending medical improvement  Subjective:  States feels well this morning. Has episode of L sided abdominal pain last night but none this morning. Will go hours between episodes, associated with nausea and when he is about to eat. During HD today is eating a cookie without nausea. No CP, SOB.   Objective: Temp:  [97.5 F (36.4 C)-98.5 F (36.9 C)] 97.5 F (36.4 C) (06/11 0655) Pulse Rate:  [68-97] 90 (06/11 0655) Resp:  [18-20] 18 (06/11 0655) BP: (139-164)/(69-105) 164/98 (06/11 0655) SpO2:  [96 %-100 %] 96 % (06/11 0522) Weight:  [275 lb (124.7 kg)-279 lb 11.2 oz (126.9 kg)] 275 lb 5.7 oz (124.9 kg) (06/11 1478) Physical Exam: General: Lying in bed, in NAD Cardiovascular: RRR, no murmurs Respiratory: CTAB, normal effort on room air. No wheezes. Abdomen: soft, nontender, nondistended, + bs Extremities: 2+ pitting edema to mid thighs   Laboratory:  Recent Labs Lab 07/23/16 1635  WBC 6.6  HGB 7.9*  HCT 25.4*  PLT 242    Recent Labs Lab 07/23/16 1635  NA 131*  K 4.7  CL 97*  CO2 19*  BUN 115*  CREATININE 13.45*  CALCIUM 7.6*  PROT 6.2*  BILITOT 0.9  ALKPHOS 98  ALT 666*  AST 358*  GLUCOSE 298*    Imaging/Diagnostic Tests: Dg Abdomen Acute W/chest  Result Date:  07/23/2016 CLINICAL DATA:  Hematemesis. Nausea and vomiting for 1 week. Prostate cancer. EXAM: DG ABDOMEN ACUTE W/ 1V CHEST COMPARISON:  None. FINDINGS: Heart is enlarged. Bilateral edema is present. Interstitial and airspace disease is noted at the lung bases bilaterally. Multiple loops of small bowel are noted in the left abdomen. Fluid levels are present without significant distention. The axial skeleton demonstrates degenerative change. IMPRESSION: 1. Cardiomegaly with interstitial and airspace disease suggesting edema and congestive heart failure. 2. Bibasilar airspace disease likely reflects atelectasis. Lower lobe pneumonia is not excluded. 3. Loops of small bowel without distention in the left abdomen likely reflect a focal ileus. 4. No obstruction or free air. Electronically Signed   By: San Morelle M.D.   On: 07/23/2016 20:16    Bufford Lope, DO 07/24/2016, 7:29 AM PGY-1, [place] Intern pager: [phone], text pages welcome

## 2016-07-24 NOTE — Progress Notes (Signed)
Admitted pt.from ED ;AAOx4;no resp.distress noted.V/S taken & recorded.IV in place on rt.FA.no redness noted.Oriented pt.to the room & ,call bell.Fall assessment done & instructed to call the nurse to call before getting OOB.Call light with reach.Noted dry & swollen with small lesion noted on rt.lower leg.With left AV fistula positive for thrill & bruit.Will continue to monitor pt.

## 2016-07-24 NOTE — Procedures (Signed)
I was present at this session.  I have reviewed the session itself and made appropriate changes.  HD via LLA avf, small needle, unfort both up and close so recirc.  Lower vol. tol 1st HD  Alleigh Mollica L 6/11/20189:09 AM

## 2016-07-24 NOTE — Telephone Encounter (Signed)
Is this okay to refill? 

## 2016-07-25 ENCOUNTER — Inpatient Hospital Stay (HOSPITAL_COMMUNITY): Admission: RE | Admit: 2016-07-25 | Payer: BLUE CROSS/BLUE SHIELD | Source: Ambulatory Visit

## 2016-07-25 DIAGNOSIS — D631 Anemia in chronic kidney disease: Secondary | ICD-10-CM

## 2016-07-25 LAB — HEPATITIS PANEL, ACUTE
HCV Ab: <0.1 s/co ratio (ref 0.0–0.9)
HEP A IGM: NEGATIVE
HEP B C IGM: NEGATIVE
HEP B S AG: NEGATIVE

## 2016-07-25 LAB — URINE CULTURE: Culture: NO GROWTH

## 2016-07-25 LAB — HEPATITIS B SURFACE ANTIBODY,QUALITATIVE
HEP B S AB: REACTIVE
Hep B S Ab: REACTIVE

## 2016-07-25 LAB — HEPATITIS B SURFACE ANTIGEN
Hepatitis B Surface Ag: NEGATIVE
Hepatitis B Surface Ag: NEGATIVE

## 2016-07-25 LAB — GLUCOSE, CAPILLARY
GLUCOSE-CAPILLARY: 195 mg/dL — AB (ref 65–99)
GLUCOSE-CAPILLARY: 161 mg/dL — AB (ref 65–99)
GLUCOSE-CAPILLARY: 217 mg/dL — AB (ref 65–99)
Glucose-Capillary: 145 mg/dL — ABNORMAL HIGH (ref 65–99)

## 2016-07-25 LAB — CBC
HCT: 22.3 % — ABNORMAL LOW (ref 39.0–52.0)
HEMOGLOBIN: 7.2 g/dL — AB (ref 13.0–17.0)
MCH: 23.9 pg — AB (ref 26.0–34.0)
MCHC: 32.3 g/dL (ref 30.0–36.0)
MCV: 74.1 fL — ABNORMAL LOW (ref 78.0–100.0)
PLATELETS: 182 10*3/uL (ref 150–400)
RBC: 3.01 MIL/uL — ABNORMAL LOW (ref 4.22–5.81)
RDW: 21.7 % — AB (ref 11.5–15.5)
WBC: 8.4 10*3/uL (ref 4.0–10.5)

## 2016-07-25 LAB — RENAL FUNCTION PANEL
ALBUMIN: 2.2 g/dL — AB (ref 3.5–5.0)
ANION GAP: 12 (ref 5–15)
BUN: 92 mg/dL — AB (ref 6–20)
CALCIUM: 7.9 mg/dL — AB (ref 8.9–10.3)
CHLORIDE: 96 mmol/L — AB (ref 101–111)
CO2: 25 mmol/L (ref 22–32)
CREATININE: 11.08 mg/dL — AB (ref 0.61–1.24)
GFR calc Af Amer: 5 mL/min — ABNORMAL LOW (ref >60)
GFR, EST NON AFRICAN AMERICAN: 4 mL/min — AB (ref >60)
GLUCOSE: 171 mg/dL — AB (ref 65–99)
POTASSIUM: 4.1 mmol/L (ref 3.5–5.1)
Phosphorus: 7.9 mg/dL — ABNORMAL HIGH (ref 2.5–4.6)
Sodium: 133 mmol/L — ABNORMAL LOW (ref 135–145)

## 2016-07-25 LAB — HEPATIC FUNCTION PANEL
ALK PHOS: 83 U/L (ref 38–126)
ALT: 489 U/L — ABNORMAL HIGH (ref 17–63)
AST: 126 U/L — ABNORMAL HIGH (ref 15–41)
Albumin: 2.2 g/dL — ABNORMAL LOW (ref 3.5–5.0)
BILIRUBIN INDIRECT: 0.6 mg/dL (ref 0.3–0.9)
Bilirubin, Direct: 0.2 mg/dL (ref 0.1–0.5)
TOTAL PROTEIN: 5.6 g/dL — AB (ref 6.5–8.1)
Total Bilirubin: 0.8 mg/dL (ref 0.3–1.2)

## 2016-07-25 LAB — HEMOGLOBIN A1C
Hgb A1c MFr Bld: 6.1 % — ABNORMAL HIGH (ref 4.8–5.6)
Mean Plasma Glucose: 128 mg/dL

## 2016-07-25 LAB — HEPATITIS B CORE ANTIBODY, TOTAL
Hep B Core Total Ab: POSITIVE — AB
Hep B Core Total Ab: POSITIVE — AB

## 2016-07-25 LAB — HIV ANTIBODY (ROUTINE TESTING W REFLEX): HIV Screen 4th Generation wRfx: NONREACTIVE

## 2016-07-25 LAB — PARATHYROID HORMONE, INTACT (NO CA): PTH: 1352 pg/mL — AB (ref 15–65)

## 2016-07-25 MED ORDER — HEPARIN SODIUM (PORCINE) 1000 UNIT/ML DIALYSIS: 1000.0000 [IU] | INTRAMUSCULAR | Status: DC | PRN

## 2016-07-25 MED ORDER — SODIUM CHLORIDE 0.9 % IV SOLN
125.0000 mg | INTRAVENOUS | Status: DC
  Administered 2016-07-26: 125 mg via INTRAVENOUS
  Filled 2016-07-25 (×2): qty 10

## 2016-07-25 MED ORDER — ALTEPLASE 2 MG IJ SOLR: 2.0000 mg | Freq: Once | INTRAMUSCULAR | Status: DC | PRN

## 2016-07-25 MED ORDER — SODIUM CHLORIDE 0.9 % IV SOLN: 100.0000 mL | INTRAVENOUS | Status: DC | PRN

## 2016-07-25 MED ORDER — KIDNEY FAILURE BOOK
Freq: Once | Status: AC
  Administered 2016-07-25: 14:00:00
  Filled 2016-07-25: qty 1

## 2016-07-25 MED ORDER — PENTAFLUOROPROP-TETRAFLUOROETH EX AERO: 1.0000 "application " | INHALATION_SPRAY | CUTANEOUS | Status: DC | PRN

## 2016-07-25 MED ORDER — CALCITRIOL 0.25 MCG PO CAPS
1.0000 ug | ORAL_CAPSULE | ORAL | Status: DC
  Administered 2016-07-26 (×2): 1 ug via ORAL
  Filled 2016-07-25: qty 4

## 2016-07-25 MED ORDER — LIDOCAINE HCL (PF) 1 % IJ SOLN: 5.0000 mL | INTRAMUSCULAR | Status: DC | PRN

## 2016-07-25 MED ORDER — HEPARIN SODIUM (PORCINE) 1000 UNIT/ML DIALYSIS: 100.0000 [IU]/kg | INTRAMUSCULAR | Status: DC | PRN

## 2016-07-25 MED ORDER — DARBEPOETIN ALFA 200 MCG/0.4ML IJ SOSY
200.0000 ug | PREFILLED_SYRINGE | INTRAMUSCULAR | Status: DC
  Administered 2016-07-26: 200 ug via INTRAVENOUS
  Filled 2016-07-25: qty 0.4

## 2016-07-25 MED ORDER — LIDOCAINE-PRILOCAINE 2.5-2.5 % EX CREA: 1.0000 "application " | TOPICAL_CREAM | CUTANEOUS | Status: DC | PRN

## 2016-07-25 MED ORDER — SODIUM CHLORIDE 0.9 % IV SOLN
125.0000 mg | INTRAVENOUS | Status: DC
  Filled 2016-07-25: qty 10

## 2016-07-25 NOTE — Procedures (Signed)
I was present at this session.  I have reviewed the session itself and made appropriate changes. bp improving.  volxs yet.  Low flows. tol well.    [name] L 6/12/20189:09 AM

## 2016-07-25 NOTE — Progress Notes (Signed)
Pt's hgb=7.2/hct=22.3;MD on call made aware.no further orders received.

## 2016-07-25 NOTE — Progress Notes (Signed)
Accepted at [facility] phone # [phone] 1st treatment Friday 07/28/16 at 11:15 am .Dailysis schedule is Monday ,Wednesday ,Friday chairtime at 12:00pm

## 2016-07-25 NOTE — Progress Notes (Signed)
Family Medicine Teaching Service Daily Progress Note Intern Pager: [phone]  Patient name: Austin Kramer Medical record number: [id number] Date of birth: [birth year] Age: 56 y.o. Gender: male  Primary Care Provider: Denita Lung, MD Consultants: Renal Code Status: Full  Pt Overview and Major Events to Date:  6/10 admitted for new ESRD  Assessment and Plan: Austin Kramer is a 56 y.o. male presenting with abdominal pain, N/V and worsening renal function. PMH is significant for CKD 5, T2DM, HTN, HLD, cardiomyopathy, pulmonary hypertension, obesity, prostate cancer s/p prostatectomy  New ESRD:  Previously baseline creatinine was around 8, had AVF placed 6 months ago. Doing well on HD. - Nephrology following, HD again today 6/12.  - continue calcitriol and phoslo - monitor renal function panel   Transaminitis, improving: AST 358>126, ALT [id number]. Normal alkaline phosphatase and total bilirubin. 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 Would like to rule out other etiologies - viral hepatitis panel pending - ferritin 287 WNL - hold statins - continue to monitor  Type 2 diabetes: sees Dr. Chalmers Cater. Has been decreasing his dosage due to 15 units of Levemir and 10 units of Humalog due to poor PO intake.  - Levemir 10 units BID and Humalog 5 units BID - sensitive SSI - CBGs - A1c 6.1  HTN: BP 127/70 improved s/p HD yesterday, was likely elevated to fluid overload. At home on amlodipine 10mg  qd, coreg 6.25mg  BID, clonidine 0.2mg  BID - continue amlodipine, coreg, clonidine - monitor  Abdominal pain, resolved: currently attributing to above issues. Patient states occurs in conjunction with nausea. No infectious symptoms currently. Non-surgical abdomen.  - monitor  Nausea and vomiting, resolved: Likely due to uremia as above. Eating well this morning. - monitor  Gout: noted to be on colchicine 0.6mg  BID. This seems very high given renal function. - decrease colchicine to  0.3mg  twice weekly.  Anemia: Hgb 7.2, stable. Most likely due to chronic kidney disease. Iron low 30, TIBC WNL, sat ratio 12 low, ferritin 287 WNL. - Receiving ferric gluconate with HD - monitor  Systolic congestive heart failure: Estimated EF 40% to 45% with diffuse hypokinesis on echo 01/2016 - continue coreg - holding home lasix and potassium  HLD:  - HOLD Crestor for transaminitis  FEN/GI: renal diet with fluid restriction Prophylaxis: heparin [place]  Disposition: pending medical improvement  Subjective:  States feels well this morning. Had one mild episode of abdominal pain overnight but non this morning. No nausea, has been able to eat well. States legs feel less tight today. No other concerns today.  Objective: Temp:  [97.5 F (36.4 C)-98.2 F (36.8 C)] 98.1 F (36.7 C) (06/12 0539) Pulse Rate:  [70-96] 70 (06/12 0539) Resp:  [16-20] 20 (06/12 0539) BP: (117-165)/(69-99) 130/80 (06/12 0539) SpO2:  [96 %-100 %] 98 % (06/12 0539) Weight:  [270 lb 4.5 oz (122.6 kg)-276 lb 1.6 oz (125.2 kg)] 276 lb 1.6 oz (125.2 kg) (06/12 0037) Physical Exam: General: Lying in bed, in NAD Cardiovascular: RRR, no murmurs Respiratory: CTAB, normal effort on room air. No wheezes. Abdomen: soft, nontender, nondistended, + bs Extremities: 2+ pitting edema to knees  Laboratory:  Recent Labs Lab 07/23/16 1635 07/24/16 0731 07/25/16 0336  WBC 6.6 6.1 8.4  HGB 7.9* 7.4* 7.2*  HCT 25.4* 23.4* 22.3*  PLT 242 228 182    Recent Labs Lab 07/23/16 1635 07/24/16 0732 07/25/16 0336  NA 131* 130* 133*  K 4.7 4.3 4.1  CL 97* 95* 96*  CO2 19* 19*  25  BUN 115* 114* 92*  CREATININE 13.45* 13.01* 11.08*  CALCIUM 7.6* 7.8* 7.9*  PROT 6.2*  --   --   BILITOT 0.9  --   --   ALKPHOS 98  --   --   ALT 666*  --   --   AST 358*  --   --   GLUCOSE 298* 179* 171*    Imaging/Diagnostic Tests: No results found.  Bufford Lope, DO 07/25/2016, 7:18 AM PGY-1, [place]  Intern pager: [phone], text pages welcome

## 2016-07-25 NOTE — Progress Notes (Signed)
Subjective: Interval History: has no complaint . Feels better, .  Objective: Vital signs in last 24 hours: Temp:  [97.5 F (36.4 C)-98.2 F (36.8 C)] 98.1 F (36.7 C) (06/12 0830) Pulse Rate:  [70-91] 71 (06/12 0830) Resp:  [12-20] 12 (06/12 0830) BP: (117-152)/(66-90) 131/66 (06/12 0830) SpO2:  [96 %-100 %] 98 % (06/12 0830) Weight:  [122.6 kg (270 lb 4.5 oz)-125.2 kg (276 lb 1.6 oz)] 125.2 kg (276 lb 1.6 oz) (06/12 0037) Weight change: -2.139 kg (-4 lb 11.5 oz)  Intake/Output from previous day: 06/11 0701 - 06/12 0700 In: 679 [P.O.:676; I.V.:3] Out: 3550 [Urine:1300] Intake/Output this shift: No intake/output data recorded.  General appearance: alert, cooperative, moderately obese and pale Resp: diminished breath sounds bilaterally and rales bibasilar Cardio: S1, S2 normal and systolic murmur: holosystolic 2/6, blowing at apex GI: obese, pos bs. soft,liver down 5 cm Extremities: edema 2-3+  Lab Results:  Recent Labs  07/24/16 0731 07/25/16 0336  WBC 6.1 8.4  HGB 7.4* 7.2*  HCT 23.4* 22.3*  PLT 228 182   BMET:  Recent Labs  07/24/16 0732 07/25/16 0336  NA 130* 133*  K 4.3 4.1  CL 95* 96*  CO2 19* 25  GLUCOSE 179* 171*  BUN 114* 92*  CREATININE 13.01* 11.08*  CALCIUM 7.8* 7.9*    Recent Labs  07/24/16 0736  PTH 1,352*   Iron Studies:  Recent Labs  07/24/16 0731 07/24/16 0926  IRON 30* 46  TIBC 312 379  FERRITIN 287  --     Studies/Results: Dg Abdomen Acute W/chest  Result Date: 07/23/2016 CLINICAL DATA:  Hematemesis. Nausea and vomiting for 1 week. Prostate cancer. EXAM: DG ABDOMEN ACUTE W/ 1V CHEST COMPARISON:  None. FINDINGS: Heart is enlarged. Bilateral edema is present. Interstitial and airspace disease is noted at the lung bases bilaterally. Multiple loops of small bowel are noted in the left abdomen. Fluid levels are present without significant distention. The axial skeleton demonstrates degenerative change. IMPRESSION: 1. Cardiomegaly  with interstitial and airspace disease suggesting edema and congestive heart failure. 2. Bibasilar airspace disease likely reflects atelectasis. Lower lobe pneumonia is not excluded. 3. Loops of small bowel without distention in the left abdomen likely reflect a focal ileus. 4. No obstruction or free air. Electronically Signed   By: San Morelle M.D.   On: 07/23/2016 20:16    I have reviewed the patient's current medications.  Assessment/Plan: 1 CKD5 now ESRD.  HD today , lower vol and solute, slowly. Will plan to do tomorrow as  Has outpatient slot [facility] MWF. 2 Vol xs  3 HTn lower vol 4 Anemia needs Fe, esa  5 HPTH vit D ^ 6 DM per primary 7 obesity P HD, repeat tomorrow, esa, Fe, vit D, ed, glu control  LOS: 2 days   Delquan Poucher L 07/25/2016,9:09 AM

## 2016-07-26 LAB — HEPATIC FUNCTION PANEL
ALK PHOS: 86 U/L (ref 38–126)
ALT: 351 U/L — ABNORMAL HIGH (ref 17–63)
AST: 55 U/L — ABNORMAL HIGH (ref 15–41)
Albumin: 2.3 g/dL — ABNORMAL LOW (ref 3.5–5.0)
BILIRUBIN INDIRECT: 0.5 mg/dL (ref 0.3–0.9)
Bilirubin, Direct: 0.1 mg/dL (ref 0.1–0.5)
TOTAL PROTEIN: 6.1 g/dL — AB (ref 6.5–8.1)
Total Bilirubin: 0.6 mg/dL (ref 0.3–1.2)

## 2016-07-26 LAB — RENAL FUNCTION PANEL
Albumin: 2.3 g/dL — ABNORMAL LOW (ref 3.5–5.0)
Anion gap: 12 (ref 5–15)
BUN: 71 mg/dL — ABNORMAL HIGH (ref 6–20)
CHLORIDE: 97 mmol/L — AB (ref 101–111)
CO2: 22 mmol/L (ref 22–32)
CREATININE: 9.32 mg/dL — AB (ref 0.61–1.24)
Calcium: 7.3 mg/dL — ABNORMAL LOW (ref 8.9–10.3)
GFR calc non Af Amer: 6 mL/min — ABNORMAL LOW (ref >60)
GFR, EST AFRICAN AMERICAN: 6 mL/min — AB (ref >60)
Glucose, Bld: 151 mg/dL — ABNORMAL HIGH (ref 65–99)
Phosphorus: 6.1 mg/dL — ABNORMAL HIGH (ref 2.5–4.6)
Potassium: 3.4 mmol/L — ABNORMAL LOW (ref 3.5–5.1)
Sodium: 131 mmol/L — ABNORMAL LOW (ref 135–145)

## 2016-07-26 LAB — CBC
HEMATOCRIT: 23.1 % — AB (ref 39.0–52.0)
Hemoglobin: 7.2 g/dL — ABNORMAL LOW (ref 13.0–17.0)
MCH: 23.4 pg — AB (ref 26.0–34.0)
MCHC: 31.2 g/dL (ref 30.0–36.0)
MCV: 75 fL — AB (ref 78.0–100.0)
Platelets: 162 10*3/uL (ref 150–400)
RBC: 3.08 MIL/uL — AB (ref 4.22–5.81)
RDW: 22 % — ABNORMAL HIGH (ref 11.5–15.5)
WBC: 5.4 10*3/uL (ref 4.0–10.5)

## 2016-07-26 LAB — GLUCOSE, CAPILLARY: Glucose-Capillary: 119 mg/dL — ABNORMAL HIGH (ref 65–99)

## 2016-07-26 MED ORDER — HEPARIN SODIUM (PORCINE) 1000 UNIT/ML DIALYSIS
100.0000 [IU]/kg | INTRAMUSCULAR | Status: DC | PRN
  Filled 2016-07-26: qty 13

## 2016-07-26 MED ORDER — LIDOCAINE-PRILOCAINE 2.5-2.5 % EX CREA
1.0000 "application " | TOPICAL_CREAM | CUTANEOUS | Status: DC | PRN
  Filled 2016-07-26: qty 5

## 2016-07-26 MED ORDER — COLCHICINE 0.6 MG PO TABS: 0.3000 mg | ORAL_TABLET | ORAL | 0 refills | Status: DC

## 2016-07-26 MED ORDER — ALTEPLASE 2 MG IJ SOLR: 2.0000 mg | Freq: Once | INTRAMUSCULAR | Status: DC | PRN

## 2016-07-26 MED ORDER — INSULIN DETEMIR 100 UNIT/ML ~~LOC~~ SOLN: 10.0000 [IU] | Freq: Two times a day (BID) | SUBCUTANEOUS | 0 refills | Status: AC

## 2016-07-26 MED ORDER — INSULIN LISPRO 100 UNIT/ML ~~LOC~~ SOLN: 5.0000 [IU] | Freq: Three times a day (TID) | SUBCUTANEOUS | 0 refills | Status: DC

## 2016-07-26 MED ORDER — CALCIUM ACETATE (PHOS BINDER) 667 MG PO CAPS: 1334.0000 mg | ORAL_CAPSULE | Freq: Three times a day (TID) | ORAL | 0 refills | Status: AC

## 2016-07-26 MED ORDER — CALCITRIOL 0.5 MCG PO CAPS
ORAL_CAPSULE | ORAL | Status: AC
  Administered 2016-07-26: 1 ug via ORAL
  Filled 2016-07-26: qty 2

## 2016-07-26 MED ORDER — KIDNEY FAILURE BOOK
Freq: Once | Status: DC
  Filled 2016-07-26: qty 1

## 2016-07-26 MED ORDER — HEPARIN SODIUM (PORCINE) 1000 UNIT/ML DIALYSIS
1000.0000 [IU] | INTRAMUSCULAR | Status: DC | PRN
  Filled 2016-07-26: qty 1

## 2016-07-26 MED ORDER — SODIUM CHLORIDE 0.9 % IV SOLN: 100.0000 mL | INTRAVENOUS | Status: DC | PRN

## 2016-07-26 MED ORDER — LIDOCAINE HCL (PF) 1 % IJ SOLN
5.0000 mL | INTRAMUSCULAR | Status: DC | PRN
  Filled 2016-07-26: qty 5

## 2016-07-26 MED ORDER — DARBEPOETIN ALFA 200 MCG/0.4ML IJ SOSY
PREFILLED_SYRINGE | INTRAMUSCULAR | Status: AC
  Administered 2016-07-26: 200 ug via INTRAVENOUS
  Filled 2016-07-26: qty 0.4

## 2016-07-26 MED ORDER — PENTAFLUOROPROP-TETRAFLUOROETH EX AERO: 1.0000 "application " | INHALATION_SPRAY | CUTANEOUS | Status: DC | PRN

## 2016-07-26 NOTE — Discharge Instructions (Signed)
It has been a pleasure taking care of you! You were admitted due to need for hemodialysis, you received 3 sessions. With that your symptoms improved to the point we think it is safe to let you go home and follow up with your primary care doctor.   For dialysis, you will be on the second shift at: [facility] [place] [address] [phone]  Your first session is tomorrow 07/27/16, please arrive at 11:15am.  Please follow up at your primary care doctor's office, you should call and make a hospital follow up appointment.

## 2016-07-26 NOTE — Progress Notes (Signed)
Subjective: Interval History: has complaints wants to get to 3rd shift to work. . Would like to go home tomorrow..  Objective: Vital signs in last 24 hours: Temp:  [97.6 F (36.4 C)-98.1 F (36.7 C)] 97.9 F (36.6 C) (06/13 0700) Pulse Rate:  [71-82] 80 (06/13 0900) Resp:  [16-20] 18 (06/13 0700) BP: (120-152)/(59-89) 148/87 (06/13 0900) SpO2:  [98 %-100 %] 100 % (06/13 0700) Weight:  [121 kg (266 lb 12.1 oz)-122.9 kg (270 lb 14.4 oz)] 121.7 kg (268 lb 4.8 oz) (06/13 0700) Weight change: -0.5 kg (-1 lb 1.6 oz)  Intake/Output from previous day: 06/12 0701 - 06/13 0700 In: 763 [P.O.:760; I.V.:3] Out: 3600 [Urine:600] Intake/Output this shift: No intake/output data recorded.  General appearance: alert, cooperative, no distress, moderately obese and pale Resp: diminished breath sounds bilaterally and rales bibasilar Cardio: S1, S2 normal and systolic murmur: holosystolic 2/6, blowing at apex GI: obese, pos bs, liver down 6 cm Extremities: edema 3+, avf LLA  Lab Results:  Recent Labs  07/25/16 0336 07/26/16 0733  WBC 8.4 5.4  HGB 7.2* 7.2*  HCT 22.3* 23.1*  PLT 182 162   BMET:  Recent Labs  07/25/16 0336 07/26/16 0735  NA 133* 131*  K 4.1 3.4*  CL 96* 97*  CO2 25 22  GLUCOSE 171* 151*  BUN 92* 71*  CREATININE 11.08* 9.32*  CALCIUM 7.9* 7.3*    Recent Labs  07/24/16 0736  PTH 1,352*   Iron Studies:  Recent Labs  07/24/16 0731 07/24/16 0926  IRON 30* 46  TIBC 312 379  FERRITIN 287  --     Studies/Results: No results found.  I have reviewed the patient's current medications.  Assessment/Plan: 1 ESRD 3rd HD. To go to [facility] for now and then to 3rd shift.  Vol xs.  2 Anemia esa, iv Fe 3 HTN lower vol 4 HPTH vit D 5  DM controlled 6 Obesity P HD, meds, lower vol, ok to d/c    LOS: 3 days   Hisham Provence L 07/26/2016,9:19 AM

## 2016-07-26 NOTE — Progress Notes (Signed)
Patient discharge instructions reviewed. Patient prescription given along with prescriptions that was faxed over to pharmacy. Patient verbalized understanding using teach back. He has his cell phone, glasses, and clothing with him.

## 2016-07-26 NOTE — Progress Notes (Signed)
Transitions of Care Pharmacy Note  Plan:  Educated on stopping Lasix, potassium, clonidine, and oral iron Counseled on Phoslo and new doses for colchicine/insulin --------------------------------------------- Austin Kramer is an 56 y.o. male who presents with a chief complaint of ESRD. In anticipation of discharge, pharmacy has reviewed this patient's prior to admission medication history, as well as current inpatient medications listed per the [facility].  Current medication indications, dosing, frequency, and notable side effects reviewed with patient. patient verbalized understanding of current inpatient medication regimen and is aware that the After Visit Summary when presented, will represent the most accurate medication list at discharge.   Assessment: Understanding of regimen: good Understanding of indications: good Potential of compliance: fair Barriers to Obtaining Medications: No  Patient instructed to contact inpatient pharmacy team with further questions or concerns if needed.    Time spent preparing for discharge counseling: 15 Time spent counseling patient: 20   Thank you for allowing pharmacy to be a part of this patient's care.  Arrie Senate, PharmD PGY-1 Pharmacy Resident Pager: [phone] 07/26/2016

## 2016-07-26 NOTE — Progress Notes (Signed)
Family Medicine Teaching Service Daily Progress Note Intern Pager: [phone]  Patient name: Austin Kramer Medical record number: [id number] Date of birth: 08/06/1960 Age: 56 y.o. Gender: male  Primary Care Provider: Denita Lung, MD Consultants: Renal Code Status: Full  Pt Overview and Major Events to Date:  6/10 admitted for new ESRD  Assessment and Plan: RODOLPH HAGEMANN is a 56 y.o. male presenting with abdominal pain, N/V and worsening renal function. PMH is significant for CKD 5, T2DM, HTN, HLD, cardiomyopathy, pulmonary hypertension, obesity, prostate cancer s/p prostatectomy  New ESRD:  Previously baseline creatinine was around 8, had AVF placed 6 months ago. Doing well on HD, 3rd session today. - Nephrology following, HD again today 6/13 before discharge home. Has outpt slot [facility] MWF - continue calcitriol and phoslo - monitor renal function panel   Transaminitis, improving: AST 358>126>55, ALT [phone]. Normal alkaline phosphatase and total bilirubin. 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  - viral hepatitis panel neg - ferritin 287 WNL - hold statins, will restart as outpatient - continue to monitor  Type 2 diabetes: sees Dr. Chalmers Cater. Has been decreasing his dosage due to 15 units of Levemir and 10 units of Humalog due to poor PO intake. A1c 6.1 - Levemir 10 units BID and Humalog 5 units BID - sensitive SSI - CBGs  HTN: BP  improved s/p HD, was likely elevated to fluid overload. At home on amlodipine 10mg  qd, coreg 6.25mg  BID, clonidine 0.2mg  BID - continue amlodipine, coreg, clonidine - monitor  Abdominal pain, resolved: currently attributing to above issues. Patient states occurs in conjunction with nausea. No infectious symptoms currently. Non-surgical abdomen.  - monitor  Nausea and vomiting, resolved: Likely due to uremia as above. - monitor  Gout: noted to be on colchicine 0.6mg  BID. This seems very high given renal function. - decrease  colchicine to 0.3mg  twice weekly.  Anemia: Hgb 7.2, stable. Most likely due to chronic kidney disease. Iron low 30, TIBC WNL, sat ratio 12 low, ferritin 287 WNL. - Receiving ferric gluconate with HD - monitor  Systolic congestive heart failure: Estimated EF 40% to 45% with diffuse hypokinesis on echo 01/2016 - continue coreg - holding home lasix and potassium  HLD:  - HOLD Crestor for transaminitis  FEN/GI: renal diet with fluid restriction Prophylaxis: heparin [place]  Disposition: DC after HD today  Subjective:  States feels well this morning. No further episodes of abdominal pain. Some mild nausea this morning from not eating breakfast this morning. Had no n/v overnight and felt well. No other concerns today.   Objective: Temp:  [97.6 F (36.4 C)-98.1 F (36.7 C)] 97.6 F (36.4 C) (06/13 0604) Pulse Rate:  [70-80] 80 (06/13 0604) Resp:  [12-20] 18 (06/13 0604) BP: (120-141)/(63-78) 141/71 (06/13 0604) SpO2:  [98 %-100 %] 100 % (06/13 0604) Weight:  [266 lb 12.1 oz (121 kg)-270 lb 14.4 oz (122.9 kg)] 270 lb 14.4 oz (122.9 kg) (06/13 0604) Physical Exam: General: Lying in bed, in NAD Cardiovascular: RRR, no murmurs Respiratory: CTAB, normal effort on room air. No wheezes. Abdomen: soft, nontender, nondistended, + bs Extremities: 2+ pitting edema to knees  Laboratory:  Recent Labs Lab 07/24/16 0731 07/25/16 0336 07/26/16 0733  WBC 6.1 8.4 5.4  HGB 7.4* 7.2* 7.2*  HCT 23.4* 22.3* 23.1*  PLT 228 182 162    Recent Labs Lab 07/23/16 1635 07/24/16 0732 07/25/16 0336 07/26/16 0733 07/26/16 0735  NA 131* 130* 133*  --  131*  K 4.7 4.3 4.1  --  3.4*  CL 97* 95* 96*  --  97*  CO2 19* 19* 25  --  22  BUN 115* 114* 92*  --  71*  CREATININE 13.45* 13.01* 11.08*  --  9.32*  CALCIUM 7.6* 7.8* 7.9*  --  7.3*  PROT 6.2*  --  5.6* 6.1*  --   BILITOT 0.9  --  0.8 0.6  --   ALKPHOS 98  --  83 86  --   ALT 666*  --  489* 351*  --   AST 358*  --  126* 55*  --   GLUCOSE  298* 179* 171*  --  151*    Imaging/Diagnostic Tests: No results found.  Bufford Lope, DO 07/26/2016, 7:24 AM PGY-1, [place] Intern pager: [phone], text pages welcome

## 2016-07-26 NOTE — Progress Notes (Signed)
Nsg Discharge Note  Admit Date:  07/23/2016 Discharge date: 07/26/2016   Austin Kramer to be D/C'd Home per MD order.  AVS completed.  Copy for chart, and copy for patient signed, and dated. Patient/caregiver able to verbalize understanding.  Discharge Medication: Allergies as of 07/26/2016   No Known Allergies     Medication List    STOP taking these medications   cloNIDine 0.2 MG tablet Commonly known as:  CATAPRES   ferrous sulfate 325 (65 FE) MG tablet   furosemide 80 MG tablet Commonly known as:  LASIX   HYDROcodone-acetaminophen 5-325 MG tablet Commonly known as:  NORCO/VICODIN   potassium chloride SA 20 MEQ tablet Commonly known as:  K-DUR,KLOR-CON     TAKE these medications   amLODipine 10 MG tablet Commonly known as:  NORVASC Take 10 mg by mouth daily.   aspirin 81 MG tablet Take 81 mg by mouth daily.   calcitRIOL 0.25 MCG capsule Commonly known as:  ROCALTROL Take 0.25 mcg by mouth daily.   calcium acetate 667 MG capsule Commonly known as:  PHOSLO Take 2 capsules (1,334 mg total) by mouth 3 (three) times daily with meals.   carvedilol 6.25 MG tablet Commonly known as:  COREG Take 1 tablet (6.25 mg total) by mouth 2 (two) times daily.   colchicine 0.6 MG tablet Take 0.5 tablets (0.3 mg total) by mouth every Wednesday and Saturday. What changed:  how much to take  when to take this   fish oil-omega-3 fatty acids 1000 MG capsule Take 1 g by mouth daily.   insulin detemir 100 UNIT/ML injection Commonly known as:  LEVEMIR Inject 0.1 mLs (10 Units total) into the skin 2 (two) times daily. What changed:  how much to take   insulin lispro 100 UNIT/ML injection Commonly known as:  HUMALOG Inject 0.05 mLs (5 Units total) into the skin 3 (three) times daily with meals. For low blood sugar as needed. Sliding scale What changed:  how much to take  when to take this   multivitamin with minerals Tabs tablet Take 1 tablet by mouth daily.    rosuvastatin 20 MG tablet Commonly known as:  CRESTOR Take 1 tablet (20 mg total) by mouth daily.   traMADol 50 MG tablet Commonly known as:  ULTRAM Take 1 tablet (50 mg total) by mouth every 6 (six) hours as needed.       Discharge Assessment: Vitals:   07/26/16 1000 07/26/16 1024  BP: (!) 153/89 (!) 156/56  Pulse: 77 83  Resp:  18  Temp:  98.1 F (36.7 C)   Skin clean, dry and intact without evidence of skin break down, no evidence of skin tears noted. IV catheter discontinued intact. Site without signs and symptoms of complications - no redness or edema noted at insertion site, patient denies c/o pain - only slight tenderness at site.  Dressing with slight pressure applied.  D/c Instructions-Education: Discharge instructions given to patient/family with verbalized understanding. D/c education completed with patient/family including follow up instructions, medication list, d/c activities limitations if indicated, with other d/c instructions as indicated by MD - patient able to verbalize understanding, all questions fully answered. Patient instructed to return to ED, call 911, or call MD for any changes in condition.   Austin Cuadra Margaretha Sheffield, RN 07/26/2016 3:49 PM

## 2016-07-26 NOTE — Discharge Summary (Signed)
[facility] Discharge Summary  Patient name: Austin Kramer Medical record number: [id number] Date of birth: 02-22-1960 Age: 56 y.o. Gender: male Date of Admission: 07/23/2016  Date of Discharge: 07/26/16 Admitting Physician: Zenia Resides, MD  Primary Care Provider: Denita Lung, MD Consultants: Nephrology  Indication for Hospitalization: New ESRD  Discharge Diagnoses/Problem List:  Patient Active Problem List   Diagnosis Date Noted  . ESRD on dialysis ([place])   . Anemia   . Dyspnea 07/23/2016  . AKI (acute kidney injury) ([place]) 07/23/2016  . Elevated LFTs   . Chronic kidney disease 04/07/2016  . Right hand pain 04/07/2016  . Swelling of right hand 04/07/2016  . Ureteritis 06/10/2013  . Renal failure 06/10/2013  . Diabetic retinopathy associated with type 2 diabetes mellitus ([place]) 12/17/2012  . 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 Hyponatremia 01/11/2012  . Hypokalemia 01/11/2012  . Renal insufficiency 01/11/2012  . Cellulitis 01/10/2012  . Leukocytosis 01/10/2012  . Type 2 diabetes mellitus with stage 5 chronic kidney disease not on chronic dialysis, with long-term current use of insulin ([place]) 03/27/2011  . Hypertension associated with diabetes ([place]) 03/27/2011  . Hyperlipidemia LDL goal <70 03/27/2011  . Obesity (BMI 30-39.9) 03/27/2011    Disposition: Home  Discharge Condition: Stable, improved  Discharge Exam: Please see progress note from day of discharge  Brief Hospital Course:  Austin Domine Burroughis a 56 y.o.malePMH significant for CKD 5, T2DM, HTN, HLD, cardiomyopathy, pulmonary hypertension, obesity, prostate cancer s/p prostatectomy who presented with abdominal pain, N/V and worsening renal function. He had AVF placed 6 months prior but had not yet undergone dialysis. Appeared volume overloaded so nephrology was consulted for concern that patinet now new ESRD. Nephrology determined no urgent need for dialysis and was started on scheduled  HD and did well with clinical improvement. Patient was also found to have transaminitis (AST 358>126>55, ALT [phone]) that was felt to be 2/2 congestive hepatopathy which also improved with HD. On day of discharge, patient was clinically doing well, VSS, and had outpatient set up for dialysis.  Issues for Follow Up:  1. Patient is new ESRD, establishing with [facility] for dialysis MWF. Already established with [facility] prior to this admission. 2. DM: Discharged on Levemir 10 units BID and Humalog 5 units BID which is less than what patient was doing at home. Likely decreased insulin usage since patient now ESRD. Please titrate regimen as needed. 3. Transaminitis improving. Please recheck LFTs and recheck patient's crestor as indicated. 4. Follow up on fluid status, home lasix and potassium for CHF held in the setting of serial HD during hospital stay  Significant Procedures: started on hemodialysis   Significant Labs and Imaging:   Recent Labs Lab 07/24/16 0731 07/25/16 0336 07/26/16 0733  WBC 6.1 8.4 5.4  HGB 7.4* 7.2* 7.2*  HCT 23.4* 22.3* 23.1*  PLT 228 182 162    Recent Labs Lab 07/23/16 1635 07/23/16 2219 07/24/16 0732 07/25/16 0336 07/26/16 0733 07/26/16 0735  NA 131*  --  130* 133*  --  131*  K 4.7  --  4.3 4.1  --  3.4*  CL 97*  --  95* 96*  --  97*  CO2 19*  --  19* 25  --  22  GLUCOSE 298*  --  179* 171*  --  151*  BUN 115*  --  114* 92*  --  71*  CREATININE 13.45*  --  13.01* 11.08*  --  9.32*  CALCIUM 7.6*  --  7.8* 7.9*  --  7.3*  PHOS  --  9.2* 9.4* 7.9*  --  6.1*  ALKPHOS 98  --   --  83 86  --   AST 358*  --   --  126* 55*  --   ALT 666*  --   --  489* 351*  --   ALBUMIN 2.5*  --  2.4* 2.2*  2.2* 2.3* 2.3*     Results/Tests Pending at Time of Discharge: none  Discharge Medications:  Allergies as of 07/26/2016   No Known Allergies     Medication List    STOP taking these medications   cloNIDine 0.2 MG  tablet Commonly known as:  CATAPRES   ferrous sulfate 325 (65 FE) MG tablet   furosemide 80 MG tablet Commonly known as:  LASIX   HYDROcodone-acetaminophen 5-325 MG tablet Commonly known as:  NORCO/VICODIN   potassium chloride SA 20 MEQ tablet Commonly known as:  K-DUR,KLOR-CON     TAKE these medications   amLODipine 10 MG tablet Commonly known as:  NORVASC Take 10 mg by mouth daily.   aspirin 81 MG tablet Take 81 mg by mouth daily.   calcitRIOL 0.25 MCG capsule Commonly known as:  ROCALTROL Take 0.25 mcg by mouth daily.   calcium acetate 667 MG capsule Commonly known as:  PHOSLO Take 2 capsules (1,334 mg total) by mouth 3 (three) times daily with meals.   carvedilol 6.25 MG tablet Commonly known as:  COREG Take 1 tablet (6.25 mg total) by mouth 2 (two) times daily.   colchicine 0.6 MG tablet Take 0.5 tablets (0.3 mg total) by mouth every Wednesday and Saturday. What changed:  how much to take  when to take this   fish oil-omega-3 fatty acids 1000 MG capsule Take 1 g by mouth daily.   insulin detemir 100 UNIT/ML injection Commonly known as:  LEVEMIR Inject 0.1 mLs (10 Units total) into the skin 2 (two) times daily. What changed:  how much to take   insulin lispro 100 UNIT/ML injection Commonly known as:  HUMALOG Inject 0.05 mLs (5 Units total) into the skin 3 (three) times daily with meals. For low blood sugar as needed. Sliding scale What changed:  how much to take  when to take this   multivitamin with minerals Tabs tablet Take 1 tablet by mouth daily.   rosuvastatin 20 MG tablet Commonly known as:  CRESTOR Take 1 tablet (20 mg total) by mouth daily.   traMADol 50 MG tablet Commonly known as:  ULTRAM Take 1 tablet (50 mg total) by mouth every 6 (six) hours as needed.       Discharge Instructions: Please refer to Patient Instructions section of EMR for full details.  Patient was counseled important signs and symptoms that should prompt return  to medical care, changes in medications, dietary instructions, activity restrictions, and follow up appointments.   Follow-Up Appointments: Follow-up Information    Denita Lung, MD. Schedule an appointment as soon as possible for a visit.   Specialty:  Family Medicine Why:  Please make a hospital follow up appointment to be seen in 1 week of leaving the hospital. Contact information: [place] [address] [place], [place], DO 07/26/2016, 12:37 PM PGY-1, [place]

## 2016-07-26 NOTE — Progress Notes (Signed)
Report given to Hemodyalisis RN Jonelle Sidle.

## 2016-07-26 NOTE — Plan of Care (Signed)
Problem: Food- and Nutrition-Related Knowledge Deficit (NB-1.1) Goal: Nutrition education Formal process to instruct or train a patient/client in a skill or to impart knowledge to help patients/clients voluntarily manage or modify food choices and eating behavior to maintain or improve health. Outcome: Adequate for Discharge Nutrition Education Note  RD consulted for Renal Education. Provided "Food Pyramid for Healthy Eating With Kidney Disease" to patient/family. Reviewed food groups and provided written recommended serving sizes specifically determined for patient's current nutritional status.   Explained why diet restrictions are needed and provided lists of foods to limit/avoid that are high potassium, sodium, and phosphorus. Provided specific recommendations on safer alternatives of these foods. Strongly encouraged compliance of this diet.   Discussed importance of protein intake at each meal and snack. Provided examples of how to maximize protein intake throughout the day. Discussed need for fluid restriction with dialysis, importance of minimizing weight gain between HD treatments, and renal-friendly beverage options.  Encouraged pt to discuss specific diet questions/concerns with RD at HD outpatient facility. Teach back method used.  Expect fair to good compliance.  Body mass index is 35.4 kg/m. Pt meets criteria for obesity, class I based on current BMI.  Current diet order is renal with 1200 ml fluid restriction, patient is consuming approximately 75% of meals at this time. Labs and medications reviewed. No further nutrition interventions warranted at this time. RD contact information provided. If additional nutrition issues arise, please re-consult RD.  Trayquan Kolakowski A. Jimmye Norman, RD, LDN, CDE Pager: [phone] After hours Pager: [phone]

## 2016-07-27 ENCOUNTER — Telehealth: Payer: Self-pay | Admitting: Family Medicine

## 2016-07-27 DIAGNOSIS — N2581 Secondary hyperparathyroidism of renal origin: Secondary | ICD-10-CM | POA: Diagnosis not present

## 2016-07-27 DIAGNOSIS — N186 End stage renal disease: Secondary | ICD-10-CM | POA: Diagnosis not present

## 2016-07-27 NOTE — Telephone Encounter (Signed)
Pt was called concerning recent hospital stay. Pt was on his way to dialysis but made an appt for next Friday. Pt was told to bring all medications with him. He stated he would. Pt does not qualify for TOC due to insurance.

## 2016-07-28 ENCOUNTER — Other Ambulatory Visit: Payer: Self-pay | Admitting: Physician Assistant

## 2016-07-29 DIAGNOSIS — N186 End stage renal disease: Secondary | ICD-10-CM | POA: Diagnosis not present

## 2016-07-29 DIAGNOSIS — N2581 Secondary hyperparathyroidism of renal origin: Secondary | ICD-10-CM | POA: Diagnosis not present

## 2016-08-01 DIAGNOSIS — N2581 Secondary hyperparathyroidism of renal origin: Secondary | ICD-10-CM | POA: Diagnosis not present

## 2016-08-01 DIAGNOSIS — N186 End stage renal disease: Secondary | ICD-10-CM | POA: Diagnosis not present

## 2016-08-03 DIAGNOSIS — N186 End stage renal disease: Secondary | ICD-10-CM | POA: Diagnosis not present

## 2016-08-03 DIAGNOSIS — N2581 Secondary hyperparathyroidism of renal origin: Secondary | ICD-10-CM | POA: Diagnosis not present

## 2016-08-04 ENCOUNTER — Ambulatory Visit (INDEPENDENT_AMBULATORY_CARE_PROVIDER_SITE_OTHER): Payer: BLUE CROSS/BLUE SHIELD | Admitting: Family Medicine

## 2016-08-04 ENCOUNTER — Encounter: Payer: Self-pay | Admitting: Family Medicine

## 2016-08-04 VITALS — BP 120/70 | HR 91 | Wt 257.0 lb

## 2016-08-04 DIAGNOSIS — E118 Type 2 diabetes mellitus with unspecified complications: Secondary | ICD-10-CM | POA: Diagnosis not present

## 2016-08-04 DIAGNOSIS — E785 Hyperlipidemia, unspecified: Secondary | ICD-10-CM

## 2016-08-04 DIAGNOSIS — N186 End stage renal disease: Secondary | ICD-10-CM | POA: Diagnosis not present

## 2016-08-04 DIAGNOSIS — Z992 Dependence on renal dialysis: Secondary | ICD-10-CM

## 2016-08-04 MED ORDER — ROSUVASTATIN CALCIUM 20 MG PO TABS: 20.0000 mg | ORAL_TABLET | Freq: Every day | ORAL | 3 refills | Status: DC

## 2016-08-04 NOTE — Progress Notes (Signed)
   Subjective:    Patient ID: Austin Kramer, male    DOB: 05/06/1960, 56 y.o.   MRN: [id number]  HPI He is here for follow-up on recent hospitalization. He was initially admitted for evaluation of abdominal pain and showed to have worsening of his underlying lying renal disease. While in the hospital he was started on dialysis. He is scheduled for dialysis Tuesday, Thursday and Saturdays. He has not yet returned to work. There is concerns about him lifting objects especially with his left arm which is where he has the shunt. He does have underlying diabetes and presently is on Levemir and Humalog. He is also on multiple other medications that were reviewed. He is comfortable with his present dosing. He was to continue to work but does realize that dialysis needs to be worked in with his schedule. He would like a refill on his Crestor.  Review of Systems     Objective:   Physical Exam Alert and in no distress otherwise not examined       Assessment & Plan:  ESRD on dialysis ([place])  Hyperlipidemia LDL goal <70  Diabetes mellitus with complication ([place]) I discussed dialysis with him in detail. He is very aware of this and seems be comfortable with dialysis. He will continue to monitor his diabetes as well as other medications. I will continue to work with him on this. We then discussed FMLA in regard to his work schedule and dialysis. Encouraged him to discuss this with the social workers at the dialysis unit to get him on a compatible schedule for both work and dialysis. Recheck here in several months. Over 25 minutes, greater than 50% spent in counseling and coordination of care.

## 2016-08-05 DIAGNOSIS — N186 End stage renal disease: Secondary | ICD-10-CM | POA: Diagnosis not present

## 2016-08-05 DIAGNOSIS — N2581 Secondary hyperparathyroidism of renal origin: Secondary | ICD-10-CM | POA: Diagnosis not present

## 2016-08-08 DIAGNOSIS — N186 End stage renal disease: Secondary | ICD-10-CM | POA: Diagnosis not present

## 2016-08-08 DIAGNOSIS — N2581 Secondary hyperparathyroidism of renal origin: Secondary | ICD-10-CM | POA: Diagnosis not present

## 2016-08-09 ENCOUNTER — Encounter (INDEPENDENT_AMBULATORY_CARE_PROVIDER_SITE_OTHER): Payer: Self-pay

## 2016-08-09 ENCOUNTER — Ambulatory Visit (INDEPENDENT_AMBULATORY_CARE_PROVIDER_SITE_OTHER): Payer: BLUE CROSS/BLUE SHIELD | Admitting: Physician Assistant

## 2016-08-09 ENCOUNTER — Encounter: Payer: Self-pay | Admitting: Physician Assistant

## 2016-08-09 VITALS — BP 116/56 | HR 94 | Ht 72.0 in | Wt 260.4 lb

## 2016-08-09 DIAGNOSIS — I429 Cardiomyopathy, unspecified: Secondary | ICD-10-CM

## 2016-08-09 DIAGNOSIS — Z992 Dependence on renal dialysis: Secondary | ICD-10-CM | POA: Diagnosis not present

## 2016-08-09 DIAGNOSIS — I10 Essential (primary) hypertension: Secondary | ICD-10-CM

## 2016-08-09 DIAGNOSIS — I272 Pulmonary hypertension, unspecified: Secondary | ICD-10-CM

## 2016-08-09 DIAGNOSIS — N186 End stage renal disease: Secondary | ICD-10-CM

## 2016-08-09 DIAGNOSIS — D509 Iron deficiency anemia, unspecified: Secondary | ICD-10-CM | POA: Diagnosis not present

## 2016-08-09 MED ORDER — CARVEDILOL 12.5 MG PO TABS: 12.5000 mg | ORAL_TABLET | Freq: Two times a day (BID) | ORAL | 3 refills | Status: DC

## 2016-08-09 MED ORDER — AMLODIPINE BESYLATE 5 MG PO TABS: 5.0000 mg | ORAL_TABLET | Freq: Every day | ORAL | 0 refills | Status: DC

## 2016-08-09 MED ORDER — AMLODIPINE BESYLATE 5 MG PO TABS: 5.0000 mg | ORAL_TABLET | Freq: Every day | ORAL | 3 refills | Status: DC

## 2016-08-09 NOTE — Progress Notes (Signed)
Cardiology Office Note    Date:  08/09/2016  ID:  IMRI LOR, DOB 1960-07-18, MRN [id number] PCP:  Denita Lung, MD  Cardiologist:  Cecilie Kicks NP had reviewed with Dr. Meda [facility] on patient's first visit   Chief Complaint: f/u cardiomyopathy  History of Present Illness:  Austin Kramer is a 56 y.o. male with history of DM type 2 (since his 55s), HTN, HLD, ESRD (placed on dialysis 07/2016), prostate CA s/p prostatectomy, GERD, peripheral neuropathy, pulmonary HTN, cardiomyopathy, anemia who presents for overdue follow-up. He was seen in 12/2015 for DOE, edema, and occasional chest discomfort. 2D Echo 02/02/16 showed moderate LVH, EF 40-45%, diffuse HK, diastolic flattening and systolic flattening, RVH, mild LAE/RAE, mild MR, PASP 28mmHg, elevated CVP. Nuclear stress test 01/2016: EF 42%, no ischemia. Due to his PASP Dr. Meda [facility] recommended further evaluation of PE. CTA was ordered but never performed - a message was left but it does not appear he returned the call. Creatinine at that time was 7-8 without dialysis.  I saw him in 02/2016 at which time he had been seen in the ED for losing his balance and fall in the setting of hypoglycemia. He struck his head. CT did not show any acute intracranial abnormality, just scalp hematoma. Labs revealed minimally elevated troponin (0.1 POC, 0.06 full) but in the setting of CKD with Cr 7.83, BUN 83, K 4.2. He reported continued DOE and had extremely high blood pressure. Close renal f/u was encouraged to determine plan for dialysis as he was felt to have reached the point where this was likely indicated. Initially a referral was sent to [facility] team but they reviewed clinical scenario and agreed that this was more likely a renal issue acutely and further workup could not take place until on HD. More recently he was admitted earlier this month with nausea, vomiting, abdominal pain and felt to be in ESRD and was started on dialysis. Last labs 07/26/16  showed Na 131, K 3.44, glucose 151, BUN 71, Cr 9.32, albumin 2.3, AST 55, ALT 351 (much higher on admission), Hgb 7.2 (previously 8-9). His iron was treated with ESA and IV iron.  He presents back for overdue follow-up. In general he feels better since starting dialysis. Has not had any chest pain but continues to notice exertional dyspnea. No syncope, palpitations or dizziness. Chronic significant LEE persists. Denies problems with hypotension at HD. Denies any bleeding.   Past Medical History:  Diagnosis Date  . Anemia   . Cardiomyopathy ([place])    a. 2D Echo 02/02/16 showed moderate LVH, EF 40-45%, diffuse HK, diastolic flattening and systolic flattening, RVH, mild LAE/RAE, mild MR, PASP 10mmHg, elevated CVP. - > Nuclear stress test 01/2016: EF 42%, no ischemia.  . Diabetes mellitus   . ED (erectile dysfunction)   . ESRD on hemodialysis ([place])    a. started HD 07/2016  . GERD (gastroesophageal reflux disease)    occasional  . Hyperlipidemia   . Hypertension    [name]  . Obesity   . Peripheral neuropathy    neuropathy, "tingling in feet"  . PPD positive, treated   . Prostate cancer ([place])   . Pulmonary hypertension ([place])    severe PAH by 01/2016 echo    Past Surgical History:  Procedure Laterality Date  . AMPUTATION  01/26/2012   Procedure: AMPUTATION RAY;  Surgeon: Newt Minion, MD;  Location: [facility];  Service: Orthopedics;  Laterality: Right;  Right foot 2nd ray amputation  .  AV FISTULA PLACEMENT Left 10/09/2014   Procedure: CREATION OF LEFT RADIOCEPHALIC ARTERIOVENOUS (AV) FISTULA ;  Surgeon: Serafina Mitchell, MD;  Location: [facility];  Service: Vascular;  Laterality: Left;  . COLONOSCOPY    . FRACTURE SURGERY     shoulder surgery  . KNEE CARTILAGE SURGERY    . PARS PLANA VITRECTOMY Left 12/17/2012   Procedure: LEFT PARS PLANA VITRECTOMY WITH 25 GAUGE/ENDO LASER;  Surgeon: Hurman Horn, MD;  Location: [facility];  Service: Ophthalmology;  Laterality: Left;  . PARS PLANA  VITRECTOMY Right 02/05/2016   Procedure: PARS PLANA VITRECTOMY WITH 25 GAUGE;  Surgeon: Jalene Mullet, MD;  Location: [facility];  Service: Ophthalmology;  Laterality: Right;  with block  . PHOTOCOAGULATION WITH LASER Left 12/17/2012   Procedure: PHOTOCOAGULATION WITH LASER;  Surgeon: Hurman Horn, MD;  Location: [facility];  Service: Ophthalmology;  Laterality: Left;  . PROSTATECTOMY  2011  . VASECTOMY      Current Medications: Current Meds  Medication Sig  . amLODipine (NORVASC) 10 MG tablet Take 10 mg by mouth daily.  Marland Kitchen aspirin 81 MG tablet Take 81 mg by mouth daily.  . calcitRIOL (ROCALTROL) 0.25 MCG capsule Take 0.25 mcg by mouth daily.  . calcium acetate (PHOSLO) 667 MG capsule Take 2 capsules (1,334 mg total) by mouth 3 (three) times daily with meals.  . carvedilol (COREG) 6.25 MG tablet TAKE ONE TABLET BY MOUTH TWICE DAILY  . fish oil-omega-3 fatty acids 1000 MG capsule Take 1 g by mouth daily.  . furosemide (LASIX) 80 MG tablet Take 80 mg by mouth 2 (two) times daily.  . insulin detemir (LEVEMIR) 100 UNIT/ML injection Inject 0.1 mLs (10 Units total) into the skin 2 (two) times daily.  . insulin lispro (HUMALOG) 100 UNIT/ML injection Inject 0.05 mLs (5 Units total) into the skin 3 (three) times daily with meals. For low blood sugar as needed. Sliding scale  . Magnesium 250 MG TABS Take 1 tablet by mouth daily.   . Misc Natural Products (TART CHERRY ADVANCED PO) Take 1 tablet by mouth daily.   . Multiple Minerals (CALCIUM/MAGNESIUM/ZINC PO) Take 1 tablet by mouth daily.   . Multiple Vitamin (MULTIVITAMIN WITH MINERALS) TABS Take 1 tablet by mouth daily.  . rosuvastatin (CRESTOR) 20 MG tablet Take 1 tablet (20 mg total) by mouth daily.  . traMADol (ULTRAM) 50 MG tablet Take 50 mg by mouth every 6 (six) hours as needed (pain).  . vitamin B-12 (CYANOCOBALAMIN) 1000 MCG tablet Take 1,000 mcg by mouth daily.     Allergies:   Patient has no known allergies.   Social History   Social History    . Marital status: Married    Spouse name: N/A  . Number of children: 4  . Years of education: N/A   Occupational History  . warehouse    Social History Main Topics  . Smoking status: Never Smoker  . Smokeless tobacco: Never Used  . Alcohol use No  . Drug use: No  . Sexual activity: Yes   Other Topics Concern  . None   Social History Narrative  . None     Family History:  Family History  Problem Relation Age of Onset  . Diabetes Mother   . Diabetes Father     ROS:   Please see the history of present illness. + persistent cough for the last few months All other systems are reviewed and otherwise negative.    PHYSICAL EXAM:   VS:  BP Marland Kitchen)  116/56   Pulse 94   Ht 6' (1.829 m)   Wt 260 lb 6.4 oz (118.1 kg)   SpO2 95%   BMI 35.32 kg/m   BMI: Body mass index is 35.32 kg/m. GEN: Chronically ill appearing AAM in no acute distress  HEENT: normocephalic, atraumatic Neck: no JVD, carotid bruits, or masses Cardiac: RRR borderline elevated rate; no murmurs, rubs, or gallops, 1+ BLE edema  Respiratory:  clear to auscultation bilaterally, normal work of breathing GI: soft, nontender, nondistended, + BS MS: no deformity or atrophy  Skin: warm and dry, no rash Neuro:  Alert and Oriented x 3, Strength and sensation are intact, follows commands Psych: euthymic mood, full affect  Wt Readings from Last 3 Encounters:  08/09/16 260 lb 6.4 oz (118.1 kg)  08/04/16 257 lb (116.6 kg)  07/26/16 261 lb 0.4 oz (118.4 kg)      Studies/Labs Reviewed:   EKG:  EKG was ordered today and personally reviewed by me and demonstrates NSR 96bpm NSIVCD, nonspecific ST-T changes  Recent Labs: 11/27/2015: B Natriuretic Peptide 439.9 02/15/2016: Magnesium 2.2 07/26/2016: ALT 351; BUN 71; Creatinine, Ser 9.32; Hemoglobin 7.2; Platelets 162; Potassium 3.4; Sodium 131   Lipid Panel    Component Value Date/Time   CHOL 171 11/25/2015 1616   TRIG 107 11/25/2015 1616   HDL 37 (L) 11/25/2015 1616    CHOLHDL 4.6 11/25/2015 1616   VLDL 21 11/25/2015 1616   LDLCALC 113 11/25/2015 1616    Additional studies/ records that were reviewed today include: Summarized above    ASSESSMENT & PLAN:   1. Cardiomyopathy and pulmonary HTN - this was diagnosed in 12/2015. Stress test showed no ischemia but both nuc and echo revealed LV dysfunction with diffuse HK as well as severely elevated PASP. However, this was also in the setting of severely uncontrolled blood pressure in the context of ESRD not yet on dialysis at that time. His symptoms have improved quite a bit but he still reports intermittent cough and DOE. Reviewed with Dr. [facility]. Would recommend to proceed with R/LHC to further evaluate both coronaries, R heart pressures and pulm HTN to determine ideal methods for treatment. Will decrease amlodipine to 5mg  daily and titrate carvedilol to 12.5mg  BID with an eye towards transitioning off amlodipine completely in the future given his LV dysfunction. The patient states he is actually going out of town to [place] in 4 days. He states he has arranged dialysis there. Per discussion with cath lab, in order to accomplish [facility] this week before he leaves, he would require readmission to be able to perform dialysis around the time of his procedure. He is not eager to do so and does not wish to proceed on Friday as he is trying to get ready to leave on that day. I discussed with Dr. Meda [facility] who agrees this is not ideal but we are OK with holding off as long as he understands if there is any change in his symptom status between now and his procedure he is to seek urgent medical attention. He affirms he will do so. Will hold off on scheduling pre-cath labs until he calls back with a date for his procedure. Dr. Meda [facility] recommends this be performed by either Dr. Haroldine Laws or Dr. Aundra Dubin given their expertise with pulmonary HTN. Also discussed importance of staying active in transit to avoid VTE.  2. Essential HTN -  much improved s/p dialysis. 3. ESRD on HD - this is performed T/Th/Sat. 4. Microcytic anemia - will  need following by PCP/renal, being treated by nephrology. Denies any bleeding.  Disposition: F/u to be determined based on scheduling of cath.   Medication Adjustments/Labs and Tests Ordered: Current medicines are reviewed at length with the patient today.  Concerns regarding medicines are outlined above. Medication changes, Labs and Tests ordered today are summarized above and listed in the Patient Instructions accessible in Encounters.   Signed, Charlie Pitter, PA-C  08/09/2016 1:46 PM    [facility] [place], [place], [place]  [address] Phone: [phone]; Fax: [phone]

## 2016-08-09 NOTE — Patient Instructions (Addendum)
Medication Instructions:  Your physician has recommended you make the following change in your medication:  1.  INCREASE the Coreg to 12.5 mg taking 1 tablet twice a day 2.  DECREASE the Amlodipine to 5 mg daily  Labwork: None ordered  Testing/Procedures: Your physician has requested that you have a cardiac catheterization. CALL us WHEN YOU GET BACK SO WE CAN GET THIS SCHEDULED.  Cardiac catheterization is used to diagnose and/or treat various heart conditions. Doctors may recommend this procedure for a number of different reasons. The most common reason is to evaluate chest pain. Chest pain can be a symptom of coronary artery disease (CAD), and cardiac catheterization can show whether plaque is narrowing or blocking your heart's arteries. This procedure is also used to evaluate the valves, as well as measure the blood flow and oxygen levels in different parts of your heart. For further information please visit [facility]. Please follow instruction sheet, as given.    Follow-Up: Your physician recommends that you schedule a follow-up appointment in: CALL us WHEN YOU RETURN    Any Other Special Instructions Will Be Listed Below (If Applicable).    Coronary Angiogram With Stent Coronary angiogram with stent placement is a procedure to widen or open a narrow blood vessel of the heart (coronary artery). Arteries may become blocked by cholesterol buildup (plaques) in the lining or wall. When a coronary artery becomes partially blocked, blood flow to that area decreases. This may lead to chest pain or a heart attack (myocardial infarction). A stent is a small piece of metal that looks like mesh or a spring. Stent placement may be done as treatment for a heart attack or right after a coronary angiogram in which a blocked artery is found. Let your health care provider know about:  Any allergies you have.  All medicines you are taking, including vitamins, herbs, eye drops, creams, and  over-the-counter medicines.  Any problems you or family members have had with anesthetic medicines.  Any blood disorders you have.  Any surgeries you have had.  Any medical conditions you have.  Whether you are pregnant or may be pregnant. What are the risks? Generally, this is a safe procedure. However, problems may occur, including:  Damage to the heart or its blood vessels.  A return of blockage.  Bleeding, infection, or bruising at the insertion site.  A collection of blood under the skin (hematoma) at the insertion site.  A blood clot in another part of the body.  Kidney injury.  Allergic reaction to the dye or contrast that is used.  Bleeding into the abdomen (retroperitoneal bleeding).  What happens before the procedure? Staying hydrated Follow instructions from your health care provider about hydration, which may include:  Up to 2 hours before the procedure - you may continue to drink clear liquids, such as water, clear fruit juice, black coffee, and plain tea.  Eating and drinking restrictions Follow instructions from your health care provider about eating and drinking, which may include:  8 hours before the procedure - stop eating heavy meals or foods such as meat, fried foods, or fatty foods.  6 hours before the procedure - stop eating light meals or foods, such as toast or cereal.  2 hours before the procedure - stop drinking clear liquids.  Ask your health care provider about:  Changing or stopping your regular medicines. This is especially important if you are taking diabetes medicines or blood thinners.  Taking medicines such as ibuprofen. These medicines can thin  your blood. Do not take these medicines before your procedure if your health care provider instructs you not to. Generally, aspirin is recommended before a procedure of passing a small, thin tube (catheter) through a blood vessel and into the heart (cardiac catheterization).  What happens  during the procedure?  An IV tube will be inserted into one of your veins.  You will be given one or more of the following: ? A medicine to help you relax (sedative). ? A medicine to numb the area where the catheter will be inserted into an artery (local anesthetic).  To reduce your risk of infection: ? Your health care team will wash or sanitize their hands. ? Your skin will be washed with soap. ? Hair may be removed from the area where the catheter will be inserted.  Using a guide wire, the catheter will be inserted into an artery. The location may be in your groin, in your wrist, or in the fold of your arm (near your elbow).  A type of X-ray (fluoroscopy) will be used to help guide the catheter to the opening of the arteries in the heart.  A dye will be injected into the catheter, and X-rays will be taken. The dye will help to show where any narrowing or blockages are located in the arteries.  A tiny wire will be guided to the blocked spot, and a balloon will be inflated to make the artery wider.  The stent will be expanded and will crush the plaques into the wall of the vessel. The stent will hold the area open and improve the blood flow. Most stents have a drug coating to reduce the risk of the stent narrowing over time.  The artery may be made wider using a drill, laser, or other tools to remove plaques.  When the blood flow is better, the catheter will be removed. The lining of the artery will grow over the stent, which stays where it was placed. This procedure may vary among health care providers and hospitals. What happens after the procedure?  If the procedure is done through the leg, you will be kept in bed lying flat for about 6 hours. You will be instructed to not bend and not cross your legs.  The insertion site will be checked frequently.  The pulse in your foot or wrist will be checked frequently.  You may have additional blood tests, X-rays, and a test that records  the electrical activity of your heart (electrocardiogram, or ECG). This information is not intended to replace advice given to you by your health care provider. Make sure you discuss any questions you have with your health care provider. Document Released: 08/06/2002 Document Revised: 09/30/2015 Document Reviewed: 09/05/2015 Elsevier Interactive Patient Education  2017 [employer].   If you need a refill on your cardiac medications before your next appointment, please call your pharmacy.

## 2016-08-10 DIAGNOSIS — N2581 Secondary hyperparathyroidism of renal origin: Secondary | ICD-10-CM | POA: Diagnosis not present

## 2016-08-10 DIAGNOSIS — N186 End stage renal disease: Secondary | ICD-10-CM | POA: Diagnosis not present

## 2016-08-12 DIAGNOSIS — N186 End stage renal disease: Secondary | ICD-10-CM | POA: Diagnosis not present

## 2016-08-12 DIAGNOSIS — E1129 Type 2 diabetes mellitus with other diabetic kidney complication: Secondary | ICD-10-CM | POA: Diagnosis not present

## 2016-08-12 DIAGNOSIS — Z992 Dependence on renal dialysis: Secondary | ICD-10-CM | POA: Diagnosis not present

## 2016-08-24 DIAGNOSIS — N2581 Secondary hyperparathyroidism of renal origin: Secondary | ICD-10-CM | POA: Diagnosis not present

## 2016-08-24 DIAGNOSIS — N186 End stage renal disease: Secondary | ICD-10-CM | POA: Diagnosis not present

## 2016-08-26 DIAGNOSIS — N2581 Secondary hyperparathyroidism of renal origin: Secondary | ICD-10-CM | POA: Diagnosis not present

## 2016-08-26 DIAGNOSIS — N186 End stage renal disease: Secondary | ICD-10-CM | POA: Diagnosis not present

## 2016-08-28 ENCOUNTER — Telehealth: Payer: Self-pay | Admitting: Family Medicine

## 2016-08-28 ENCOUNTER — Telehealth: Payer: Self-pay | Admitting: Cardiology

## 2016-08-28 DIAGNOSIS — E113512 Type 2 diabetes mellitus with proliferative diabetic retinopathy with macular edema, left eye: Secondary | ICD-10-CM | POA: Diagnosis not present

## 2016-08-28 DIAGNOSIS — H4311 Vitreous hemorrhage, right eye: Secondary | ICD-10-CM | POA: Diagnosis not present

## 2016-08-28 NOTE — Telephone Encounter (Signed)
New message   Pt calling back with concerns with his recent stent placement, requests a nurse call him back

## 2016-08-28 NOTE — Telephone Encounter (Signed)
I called Austin Kramer and he had me call back and leave message with ladys name and Number

## 2016-08-28 NOTE — Telephone Encounter (Signed)
Check and with [place] and see if he needs any help

## 2016-08-28 NOTE — Telephone Encounter (Signed)
Received a call from Charletta Cousin with case management at [name]. She has been trying to contact pt to see if needs any assistance. He has not returned her call. She wanted you to be aware that she is available if you needs any assistance with this pt. She can be reached at [phone].

## 2016-08-28 NOTE — Telephone Encounter (Signed)
Left a message for the pt to call back.  Appears that Dayna Dunn PA-C and covering CMA advised this pt to call back when he is ready for scheduling of his cath. This was advised at pts last OV with Dayna. Will forward this message to Dayna's covering CMA for further review and follow-up with the pt.

## 2016-08-29 DIAGNOSIS — N186 End stage renal disease: Secondary | ICD-10-CM | POA: Diagnosis not present

## 2016-08-29 DIAGNOSIS — N2581 Secondary hyperparathyroidism of renal origin: Secondary | ICD-10-CM | POA: Diagnosis not present

## 2016-08-29 NOTE — Telephone Encounter (Signed)
Follow up     Pt is returning Austin Kramer call

## 2016-08-29 NOTE — Telephone Encounter (Signed)
Follow Up:  Humphrey said he talked to [facility] and she told him to call today and ask for you.She said you would schedule his procedure.

## 2016-08-29 NOTE — Telephone Encounter (Signed)
Returned pts call.  Per Austin Copa, PA-C, pt has been scheduled for R & L Heart Cath for 09/01/16 with Dr. Martinique Pt has been verbally advised of his instructions and also will pick up the printed version as well when he comes for his pre procedure labs on 08/30/16.  Pt verbalized understanding.     @LOGO @  [place] OFFICE [address] [place] [address] Dept: [phone] Loc: [phone]  DARIVS LUNDEN  08/29/2016  You are scheduled for a Cardiac Catheterization on Friday, July 20 with Dr. Peter Martinique.  1. Please arrive at the [facility] (Main Entrance A) at [facility]: [place], [place] [address] at 10:00 AM (two hours before your procedure to ensure your preparation). Free valet parking service is available.   Special note: Every effort is made to have your procedure done on time. Please understand that emergencies sometimes delay scheduled procedures.  2. Diet: Do not eat or drink anything after midnight prior to your procedure except sips of water to take medications.  3. Labs: You will need to have blood drawn on Wednesday, July 18 at [facility]. [address]  Open: 7:30am - 5pm    Phone: [phone]. You do not need to be fasting.  4. Medication instructions in preparation for your procedure:  Take only 1/2 DOSE OF INSULIN THE NIGHT BEFORE YOUR PROCEDURE AND DO NOT TAKE ANY AT ALL THE MORNING OF   On the morning of your procedure, take your Aspirin and any morning medicines NOT listed above.  You may use sips of water.  5. Plan for one night stay--bring personal belongings. 6. Bring a current list of your medications and current insurance cards. 7. You MUST have a responsible person to drive you home. 8. Someone MUST be with you the first 24 hours after you arrive home or your discharge will be  delayed. 9. Please wear clothes that are easy to get on and off and wear slip-on shoes.  Thank you for allowing [place] to care for you!   -- [facility]

## 2016-08-30 ENCOUNTER — Telehealth: Payer: Self-pay

## 2016-08-30 ENCOUNTER — Other Ambulatory Visit: Payer: Self-pay | Admitting: Physician Assistant

## 2016-08-30 ENCOUNTER — Other Ambulatory Visit: Payer: BLUE CROSS/BLUE SHIELD

## 2016-08-30 DIAGNOSIS — N186 End stage renal disease: Secondary | ICD-10-CM

## 2016-08-30 DIAGNOSIS — I429 Cardiomyopathy, unspecified: Secondary | ICD-10-CM | POA: Diagnosis not present

## 2016-08-30 DIAGNOSIS — Z992 Dependence on renal dialysis: Secondary | ICD-10-CM

## 2016-08-30 DIAGNOSIS — I10 Essential (primary) hypertension: Secondary | ICD-10-CM

## 2016-08-30 DIAGNOSIS — I272 Pulmonary hypertension, unspecified: Secondary | ICD-10-CM

## 2016-08-30 DIAGNOSIS — D509 Iron deficiency anemia, unspecified: Secondary | ICD-10-CM

## 2016-08-30 LAB — CBC
Hematocrit: 28.6 % — ABNORMAL LOW (ref 37.5–51.0)
Hemoglobin: 9.2 g/dL — ABNORMAL LOW (ref 13.0–17.7)
MCH: 24.3 pg — ABNORMAL LOW (ref 26.6–33.0)
MCHC: 32.2 g/dL (ref 31.5–35.7)
MCV: 76 fL — AB (ref 79–97)
Platelets: 239 10*3/uL (ref 150–379)
RBC: 3.78 x10E6/uL — ABNORMAL LOW (ref 4.14–5.80)
RDW: 20.2 % — AB (ref 12.3–15.4)
WBC: 8.4 10*3/uL (ref 3.4–10.8)

## 2016-08-30 LAB — BASIC METABOLIC PANEL
BUN / CREAT RATIO: 6 — AB (ref 9–20)
BUN: 36 mg/dL — AB (ref 6–24)
CO2: 27 mmol/L (ref 20–29)
CREATININE: 6.44 mg/dL — AB (ref 0.76–1.27)
Calcium: 8.4 mg/dL — ABNORMAL LOW (ref 8.7–10.2)
Chloride: 95 mmol/L — ABNORMAL LOW (ref 96–106)
GFR calc non Af Amer: 9 mL/min/{1.73_m2} — ABNORMAL LOW (ref >59)
GFR, EST AFRICAN AMERICAN: 10 mL/min/{1.73_m2} — AB (ref >59)
Glucose: 363 mg/dL — ABNORMAL HIGH (ref 65–99)
Potassium: 4.4 mmol/L (ref 3.5–5.2)
Sodium: 132 mmol/L — ABNORMAL LOW (ref 134–144)

## 2016-08-30 LAB — PROTIME-INR
INR: 1 (ref 0.8–1.2)
Prothrombin Time: 10.4 s (ref 9.1–12.0)

## 2016-08-30 NOTE — Telephone Encounter (Signed)
Left detailed message per DPR.  Pt also scheduled to have labs drawn today 08/30/2016 @ [employer] office and will pick up his letter with detailed directions at that time.    Patient contacted pre-catheterization at [facility] scheduled for:  09/01/2016 @ 1200 Verified arrival time and place:  NT @ 1000  AM meds to be taken pre-cath with sip of water:   Left message notifying Pt to take ASA prior to arrival day of procedure.  Notified Pt to hold lasix day of procedure.  Notified Pt to take 1/2 dose insulins night before procedure and hold all insulins day of procedure. Patient has responsible person to drive home post procedure and observe patient for 24 hours:  Left message notifying Addl concerns:   Pt with ESRD.  Last Cr 9.32 and last HGB 7.2.  Pt having repeat bloodwork today 08/30/2016

## 2016-08-31 ENCOUNTER — Telehealth: Payer: Self-pay | Admitting: Physician Assistant

## 2016-08-31 DIAGNOSIS — N186 End stage renal disease: Secondary | ICD-10-CM | POA: Diagnosis not present

## 2016-08-31 DIAGNOSIS — N2581 Secondary hyperparathyroidism of renal origin: Secondary | ICD-10-CM | POA: Diagnosis not present

## 2016-08-31 NOTE — Telephone Encounter (Signed)
Pt scheduled for cath tomorrow, noticed that his pre cath labs were drawn yesterday but have not resulted yet. Reached out to triage this afternoon to help look into this - spoke with Fraser Din. Thank you, Fraser Din, for your help! Dayna Dunn PA-C

## 2016-08-31 NOTE — Telephone Encounter (Signed)
This was sent to [facility] office.  Routing to [facility] triage.

## 2016-08-31 NOTE — Telephone Encounter (Signed)
Hard copy of pts pre-cath labs received from [place], lab tech.   Below are pts current lab values:  Hemoglobin--9.2 Hematocrit--28.6  Glucose--363 BUN--36 Creatinine--6.44 Sodium--132 Potassium--4.4 Chloride--95 Calcium--8.4  INR--1.0 PT--10.4  These labs were faxed to the cath lab at [phone]. Labs were reviewed by Dr Meda Coffee, and pt is ok to proceed with scheduled cath for tomorrow 7/20 at 12 pm for Dr Martinique to do.  Pts labs were compared from a month ago, and show improvement.  This pt has ESRD and on hemodialysis.  Spoke with Melina Copa PA-C about cath labs.  This has not crossed into EPIC, due to system failure.  Will leave for triage to have these labs scanned in, in the morning for all parties to review.

## 2016-09-01 ENCOUNTER — Telehealth: Payer: Self-pay | Admitting: *Deleted

## 2016-09-01 ENCOUNTER — Encounter (HOSPITAL_COMMUNITY)
Admission: RE | Disposition: A | Discharge status: Home / Self Care | Payer: Self-pay | Source: Ambulatory Visit | Attending: Cardiology

## 2016-09-01 ENCOUNTER — Inpatient Hospital Stay (HOSPITAL_COMMUNITY)
Admission: RE | Admit: 2016-09-01 | Discharge: 2016-09-02 | DRG: 286 | Disposition: A | Discharge status: Home / Self Care | Payer: BLUE CROSS/BLUE SHIELD | Source: Ambulatory Visit | Attending: Cardiology | Admitting: Cardiology

## 2016-09-01 ENCOUNTER — Encounter (HOSPITAL_COMMUNITY): Payer: Self-pay | Admitting: Cardiology

## 2016-09-01 DIAGNOSIS — D509 Iron deficiency anemia, unspecified: Secondary | ICD-10-CM | POA: Diagnosis not present

## 2016-09-01 DIAGNOSIS — I5023 Acute on chronic systolic (congestive) heart failure: Secondary | ICD-10-CM | POA: Diagnosis not present

## 2016-09-01 DIAGNOSIS — N186 End stage renal disease: Secondary | ICD-10-CM

## 2016-09-01 DIAGNOSIS — I132 Hypertensive heart and chronic kidney disease with heart failure and with stage 5 chronic kidney disease, or end stage renal disease: Principal | ICD-10-CM | POA: Diagnosis present

## 2016-09-01 DIAGNOSIS — I10 Essential (primary) hypertension: Secondary | ICD-10-CM

## 2016-09-01 DIAGNOSIS — I5043 Acute on chronic combined systolic (congestive) and diastolic (congestive) heart failure: Secondary | ICD-10-CM

## 2016-09-01 DIAGNOSIS — Z7982 Long term (current) use of aspirin: Secondary | ICD-10-CM

## 2016-09-01 DIAGNOSIS — I2510 Atherosclerotic heart disease of native coronary artery without angina pectoris: Secondary | ICD-10-CM | POA: Diagnosis present

## 2016-09-01 DIAGNOSIS — Z79899 Other long term (current) drug therapy: Secondary | ICD-10-CM | POA: Diagnosis not present

## 2016-09-01 DIAGNOSIS — M898X9 Other specified disorders of bone, unspecified site: Secondary | ICD-10-CM | POA: Diagnosis not present

## 2016-09-01 DIAGNOSIS — Z8546 Personal history of malignant neoplasm of prostate: Secondary | ICD-10-CM | POA: Diagnosis not present

## 2016-09-01 DIAGNOSIS — E669 Obesity, unspecified: Secondary | ICD-10-CM | POA: Diagnosis present

## 2016-09-01 DIAGNOSIS — N185 Chronic kidney disease, stage 5: Secondary | ICD-10-CM | POA: Diagnosis present

## 2016-09-01 DIAGNOSIS — E785 Hyperlipidemia, unspecified: Secondary | ICD-10-CM | POA: Diagnosis present

## 2016-09-01 DIAGNOSIS — Z6839 Body mass index (BMI) 39.0-39.9, adult: Secondary | ICD-10-CM

## 2016-09-01 DIAGNOSIS — Z89421 Acquired absence of other right toe(s): Secondary | ICD-10-CM | POA: Diagnosis not present

## 2016-09-01 DIAGNOSIS — I272 Pulmonary hypertension, unspecified: Secondary | ICD-10-CM | POA: Diagnosis present

## 2016-09-01 DIAGNOSIS — E1159 Type 2 diabetes mellitus with other circulatory complications: Secondary | ICD-10-CM | POA: Diagnosis present

## 2016-09-01 DIAGNOSIS — E11649 Type 2 diabetes mellitus with hypoglycemia without coma: Secondary | ICD-10-CM | POA: Diagnosis present

## 2016-09-01 DIAGNOSIS — E1142 Type 2 diabetes mellitus with diabetic polyneuropathy: Secondary | ICD-10-CM | POA: Diagnosis present

## 2016-09-01 DIAGNOSIS — I5022 Chronic systolic (congestive) heart failure: Secondary | ICD-10-CM | POA: Diagnosis not present

## 2016-09-01 DIAGNOSIS — E1122 Type 2 diabetes mellitus with diabetic chronic kidney disease: Secondary | ICD-10-CM | POA: Diagnosis present

## 2016-09-01 DIAGNOSIS — D631 Anemia in chronic kidney disease: Secondary | ICD-10-CM | POA: Diagnosis not present

## 2016-09-01 DIAGNOSIS — Z9079 Acquired absence of other genital organ(s): Secondary | ICD-10-CM

## 2016-09-01 DIAGNOSIS — Z794 Long term (current) use of insulin: Secondary | ICD-10-CM | POA: Diagnosis not present

## 2016-09-01 DIAGNOSIS — I5021 Acute systolic (congestive) heart failure: Secondary | ICD-10-CM | POA: Diagnosis not present

## 2016-09-01 DIAGNOSIS — Z992 Dependence on renal dialysis: Secondary | ICD-10-CM

## 2016-09-01 DIAGNOSIS — I152 Hypertension secondary to endocrine disorders: Secondary | ICD-10-CM | POA: Diagnosis present

## 2016-09-01 HISTORY — PX: RIGHT/LEFT HEART CATH AND CORONARY ANGIOGRAPHY: CATH118266

## 2016-09-01 LAB — POCT I-STAT 3, ART BLOOD GAS (G3+)
Acid-Base Excess: 7 mmol/L — ABNORMAL HIGH (ref 0.0–2.0)
Bicarbonate: 31.3 mmol/L — ABNORMAL HIGH (ref 20.0–28.0)
O2 SAT: 95 %
PCO2 ART: 44.5 mmHg (ref 32.0–48.0)
PO2 ART: 72 mmHg — AB (ref 83.0–108.0)
TCO2: 33 mmol/L (ref 0–100)
pH, Arterial: 7.455 — ABNORMAL HIGH (ref 7.350–7.450)

## 2016-09-01 LAB — POCT I-STAT 3, VENOUS BLOOD GAS (G3P V)
ACID-BASE EXCESS: 7 mmol/L — AB (ref 0.0–2.0)
BICARBONATE: 32.2 mmol/L — AB (ref 20.0–28.0)
O2 Saturation: 54 %
PH VEN: 7.425 (ref 7.250–7.430)
PO2 VEN: 28 mmHg — AB (ref 32.0–45.0)
TCO2: 34 mmol/L (ref 0–100)
pCO2, Ven: 49.2 mmHg (ref 44.0–60.0)

## 2016-09-01 LAB — RENAL FUNCTION PANEL
ALBUMIN: 2.6 g/dL — AB (ref 3.5–5.0)
ANION GAP: 10 (ref 5–15)
BUN: 39 mg/dL — ABNORMAL HIGH (ref 6–20)
CHLORIDE: 96 mmol/L — AB (ref 101–111)
CO2: 28 mmol/L (ref 22–32)
Calcium: 8.9 mg/dL (ref 8.9–10.3)
Creatinine, Ser: 6.31 mg/dL — ABNORMAL HIGH (ref 0.61–1.24)
GFR calc Af Amer: 10 mL/min — ABNORMAL LOW (ref >60)
GFR, EST NON AFRICAN AMERICAN: 9 mL/min — AB (ref >60)
Glucose, Bld: 252 mg/dL — ABNORMAL HIGH (ref 65–99)
PHOSPHORUS: 5 mg/dL — AB (ref 2.5–4.6)
POTASSIUM: 4.5 mmol/L (ref 3.5–5.1)
Sodium: 134 mmol/L — ABNORMAL LOW (ref 135–145)

## 2016-09-01 LAB — CBC
HCT: 29.6 % — ABNORMAL LOW (ref 39.0–52.0)
HEMOGLOBIN: 9.3 g/dL — AB (ref 13.0–17.0)
MCH: 24.3 pg — ABNORMAL LOW (ref 26.0–34.0)
MCHC: 31.4 g/dL (ref 30.0–36.0)
MCV: 77.3 fL — ABNORMAL LOW (ref 78.0–100.0)
Platelets: 244 10*3/uL (ref 150–400)
RBC: 3.83 MIL/uL — ABNORMAL LOW (ref 4.22–5.81)
RDW: 18.7 % — ABNORMAL HIGH (ref 11.5–15.5)
WBC: 8.1 10*3/uL (ref 4.0–10.5)

## 2016-09-01 LAB — GLUCOSE, CAPILLARY
GLUCOSE-CAPILLARY: 126 mg/dL — AB (ref 65–99)
GLUCOSE-CAPILLARY: 283 mg/dL — AB (ref 65–99)
Glucose-Capillary: 166 mg/dL — ABNORMAL HIGH (ref 65–99)
Glucose-Capillary: 249 mg/dL — ABNORMAL HIGH (ref 65–99)

## 2016-09-01 SURGERY — RIGHT/LEFT HEART CATH AND CORONARY ANGIOGRAPHY
Anesthesia: LOCAL

## 2016-09-01 MED ORDER — LIDOCAINE HCL (PF) 1 % IJ SOLN
INTRAMUSCULAR | Status: DC | PRN
  Administered 2016-09-01: 20 mL via INTRADERMAL

## 2016-09-01 MED ORDER — ACETAMINOPHEN 325 MG PO TABS: 650.0000 mg | ORAL_TABLET | ORAL | Status: DC | PRN

## 2016-09-01 MED ORDER — CALCIUM ACETATE (PHOS BINDER) 667 MG PO CAPS: 1334.0000 mg | ORAL_CAPSULE | Freq: Two times a day (BID) | ORAL | Status: DC

## 2016-09-01 MED ORDER — HEPARIN (PORCINE) IN NACL 2-0.9 UNIT/ML-% IJ SOLN
INTRAMUSCULAR | Status: AC | PRN
  Administered 2016-09-01: 1000 mL via INTRA_ARTERIAL

## 2016-09-01 MED ORDER — LIDOCAINE HCL (PF) 1 % IJ SOLN: 5.0000 mL | INTRAMUSCULAR | Status: DC | PRN

## 2016-09-01 MED ORDER — ISOSORBIDE MONONITRATE ER 30 MG PO TB24
30.0000 mg | ORAL_TABLET | Freq: Every day | ORAL | Status: DC
  Administered 2016-09-02: 30 mg via ORAL
  Filled 2016-09-01 (×2): qty 1

## 2016-09-01 MED ORDER — HEPARIN (PORCINE) IN NACL 2-0.9 UNIT/ML-% IJ SOLN
INTRAMUSCULAR | Status: AC
  Filled 2016-09-01: qty 1000

## 2016-09-01 MED ORDER — ROSUVASTATIN CALCIUM 20 MG PO TABS
20.0000 mg | ORAL_TABLET | Freq: Every day | ORAL | Status: DC
  Administered 2016-09-01 – 2016-09-02 (×2): 20 mg via ORAL
  Filled 2016-09-01 (×2): qty 1

## 2016-09-01 MED ORDER — SODIUM CHLORIDE 0.9% FLUSH: 3.0000 mL | INTRAVENOUS | Status: DC | PRN

## 2016-09-01 MED ORDER — CALCITRIOL 0.25 MCG PO CAPS: 0.2500 ug | ORAL_CAPSULE | Freq: Every day | ORAL | Status: DC

## 2016-09-01 MED ORDER — CALCIUM ACETATE (PHOS BINDER) 667 MG PO CAPS
1334.0000 mg | ORAL_CAPSULE | Freq: Three times a day (TID) | ORAL | Status: DC
  Administered 2016-09-02 (×3): 1334 mg via ORAL
  Filled 2016-09-01 (×4): qty 2

## 2016-09-01 MED ORDER — ASPIRIN EC 81 MG PO TBEC
81.0000 mg | DELAYED_RELEASE_TABLET | Freq: Every day | ORAL | Status: DC
  Administered 2016-09-01 – 2016-09-02 (×2): 81 mg via ORAL
  Filled 2016-09-01 (×2): qty 1

## 2016-09-01 MED ORDER — HEPARIN SODIUM (PORCINE) 1000 UNIT/ML DIALYSIS
9000.0000 [IU] | Freq: Once | INTRAMUSCULAR | Status: AC
  Administered 2016-09-02: 9000 [IU] via INTRAVENOUS_CENTRAL

## 2016-09-01 MED ORDER — SODIUM CHLORIDE 0.9 % IV SOLN: 100.0000 mL | INTRAVENOUS | Status: DC | PRN

## 2016-09-01 MED ORDER — SODIUM CHLORIDE 0.9 % IV SOLN
100.0000 mL | INTRAVENOUS | Status: DC | PRN
Start: 2016-09-01 — End: 2016-09-03

## 2016-09-01 MED ORDER — SODIUM CHLORIDE 0.9% FLUSH
3.0000 mL | Freq: Two times a day (BID) | INTRAVENOUS | Status: DC
  Administered 2016-09-01 – 2016-09-02 (×3): 3 mL via INTRAVENOUS

## 2016-09-01 MED ORDER — PENTAFLUOROPROP-TETRAFLUOROETH EX AERO: 1.0000 "application " | INHALATION_SPRAY | CUTANEOUS | Status: DC | PRN

## 2016-09-01 MED ORDER — CARVEDILOL 6.25 MG PO TABS
6.2500 mg | ORAL_TABLET | Freq: Two times a day (BID) | ORAL | Status: DC
  Administered 2016-09-02 (×2): 6.25 mg via ORAL
  Filled 2016-09-01 (×2): qty 1

## 2016-09-01 MED ORDER — FENTANYL CITRATE (PF) 100 MCG/2ML IJ SOLN
INTRAMUSCULAR | Status: DC | PRN
  Administered 2016-09-01: 50 ug via INTRAVENOUS

## 2016-09-01 MED ORDER — OMEGA-3-ACID ETHYL ESTERS 1 G PO CAPS
1.0000 g | ORAL_CAPSULE | Freq: Every day | ORAL | Status: DC
  Administered 2016-09-01 – 2016-09-02 (×2): 1 g via ORAL
  Filled 2016-09-01 (×2): qty 1

## 2016-09-01 MED ORDER — NITROGLYCERIN 0.4 MG SL SUBL: 0.4000 mg | SUBLINGUAL_TABLET | SUBLINGUAL | Status: DC | PRN

## 2016-09-01 MED ORDER — MIDAZOLAM HCL 2 MG/2ML IJ SOLN
INTRAMUSCULAR | Status: DC | PRN
  Administered 2016-09-01: 1 mg via INTRAVENOUS

## 2016-09-01 MED ORDER — LIDOCAINE-PRILOCAINE 2.5-2.5 % EX CREA: 1.0000 "application " | TOPICAL_CREAM | CUTANEOUS | Status: DC | PRN

## 2016-09-01 MED ORDER — INSULIN DETEMIR 100 UNIT/ML ~~LOC~~ SOLN
12.0000 [IU] | Freq: Two times a day (BID) | SUBCUTANEOUS | Status: DC
  Administered 2016-09-01 – 2016-09-02 (×3): 12 [IU] via SUBCUTANEOUS
  Filled 2016-09-01 (×4): qty 0.12

## 2016-09-01 MED ORDER — LIDOCAINE HCL (PF) 1 % IJ SOLN
INTRAMUSCULAR | Status: AC
  Filled 2016-09-01: qty 30

## 2016-09-01 MED ORDER — ASPIRIN 81 MG PO CHEW
81.0000 mg | CHEWABLE_TABLET | ORAL | Status: AC
  Administered 2016-09-01: 81 mg via ORAL

## 2016-09-01 MED ORDER — HYDRALAZINE HCL 25 MG PO TABS
25.0000 mg | ORAL_TABLET | Freq: Three times a day (TID) | ORAL | Status: DC
  Administered 2016-09-02 (×2): 25 mg via ORAL
  Filled 2016-09-01 (×4): qty 1

## 2016-09-01 MED ORDER — FENTANYL CITRATE (PF) 100 MCG/2ML IJ SOLN
INTRAMUSCULAR | Status: AC
  Filled 2016-09-01: qty 2

## 2016-09-01 MED ORDER — HEPARIN SODIUM (PORCINE) 5000 UNIT/ML IJ SOLN
5000.0000 [IU] | Freq: Three times a day (TID) | INTRAMUSCULAR | Status: DC
  Administered 2016-09-01 – 2016-09-02 (×3): 5000 [IU] via SUBCUTANEOUS
  Filled 2016-09-01 (×3): qty 1

## 2016-09-01 MED ORDER — ALTEPLASE 2 MG IJ SOLR: 2.0000 mg | Freq: Once | INTRAMUSCULAR | Status: DC | PRN

## 2016-09-01 MED ORDER — HEPARIN SODIUM (PORCINE) 1000 UNIT/ML DIALYSIS: 1000.0000 [IU] | INTRAMUSCULAR | Status: DC | PRN

## 2016-09-01 MED ORDER — ONDANSETRON HCL 4 MG/2ML IJ SOLN
4.0000 mg | Freq: Four times a day (QID) | INTRAMUSCULAR | Status: DC | PRN
  Administered 2016-09-02: 4 mg via INTRAVENOUS

## 2016-09-01 MED ORDER — ASPIRIN 81 MG PO CHEW
CHEWABLE_TABLET | ORAL | Status: AC
  Administered 2016-09-01: 81 mg via ORAL
  Filled 2016-09-01: qty 1

## 2016-09-01 MED ORDER — IOPAMIDOL (ISOVUE-370) INJECTION 76%
INTRAVENOUS | Status: AC
  Filled 2016-09-01: qty 100

## 2016-09-01 MED ORDER — MIDAZOLAM HCL 2 MG/2ML IJ SOLN
INTRAMUSCULAR | Status: AC
  Filled 2016-09-01: qty 2

## 2016-09-01 MED ORDER — SODIUM CHLORIDE 0.9 % IV SOLN: 250.0000 mL | INTRAVENOUS | Status: DC | PRN

## 2016-09-01 MED ORDER — IOPAMIDOL (ISOVUE-370) INJECTION 76%
INTRAVENOUS | Status: DC | PRN
  Administered 2016-09-01: 70 mL via INTRA_ARTERIAL

## 2016-09-01 MED ORDER — AMLODIPINE BESYLATE 5 MG PO TABS
5.0000 mg | ORAL_TABLET | Freq: Every day | ORAL | Status: DC
  Administered 2016-09-02: 5 mg via ORAL
  Filled 2016-09-01 (×2): qty 1

## 2016-09-01 MED ORDER — CALCITRIOL 0.5 MCG PO CAPS
1.0000 ug | ORAL_CAPSULE | ORAL | Status: DC
  Administered 2016-09-01 – 2016-09-02 (×2): 1 ug via ORAL
  Filled 2016-09-01 (×2): qty 2

## 2016-09-01 MED ORDER — INSULIN ASPART 100 UNIT/ML ~~LOC~~ SOLN
0.0000 [IU] | Freq: Three times a day (TID) | SUBCUTANEOUS | Status: DC
  Administered 2016-09-02: 3 [IU] via SUBCUTANEOUS
  Administered 2016-09-02: 5 [IU] via SUBCUTANEOUS

## 2016-09-01 MED ORDER — SODIUM CHLORIDE 0.9 % IV SOLN
INTRAVENOUS | Status: DC
  Administered 2016-09-01: 10:00:00 via INTRAVENOUS

## 2016-09-01 SURGICAL SUPPLY — 14 items
CATH INFINITI 5FR JL5 (CATHETERS) ×2 IMPLANT
CATH INFINITI 5FR MULTPACK ANG (CATHETERS) ×2 IMPLANT
CATH SWAN GANZ 7F STRAIGHT (CATHETERS) ×2 IMPLANT
COVER PRB 48X5XTLSCP FOLD TPE (BAG) ×1 IMPLANT
COVER PROBE 5X48 (BAG) ×1
KIT HEART LEFT (KITS) ×2 IMPLANT
KIT HEART RIGHT NAMIC (KITS) ×2 IMPLANT
PACK CARDIAC CATHETERIZATION (CUSTOM PROCEDURE TRAY) ×2 IMPLANT
SHEATH PINNACLE 5F 10CM (SHEATH) ×2 IMPLANT
SHEATH PINNACLE 7F 10CM (SHEATH) ×2 IMPLANT
SYR MEDRAD MARK V 150ML (SYRINGE) ×2 IMPLANT
TRANSDUCER W/STOPCOCK (MISCELLANEOUS) ×2 IMPLANT
WIRE EMERALD 3MM-J .025X260CM (WIRE) ×2 IMPLANT
WIRE EMERALD 3MM-J .035X150CM (WIRE) ×2 IMPLANT

## 2016-09-01 NOTE — Consult Note (Signed)
Reason for Consult: To manage dialysis and dialysis related needs Referring Physician: Dr. Peter Martinique  Austin Kramer is an 56 y.o. male.  HPI: Pt is a 29M with a PMH significant for ESRD on HD, cardiomyopathy with EF 25-35%, pulmonary HTN, who is now seen in consultation at the request of Dr. Martinique for provision of HD and management of ESRD.  Pt was scheduled for a L and R heart cath today.  He was found to have severe LV systolic dysfunction with EF 25-35%, moderate pulmonary HTN, and moderately elevated LVEDP.  He was scheduled to go home but began having increasing SOB and desats in the recovery room.  He also had to have the procedure done on a wedge due to inability to lay flat.  He reports increased cough and SOB.  No CP, HA, n/v.  Has chronic LE edema.  Dialyzes at [facility]  EDW 114 kg TTS 4 hrs HD [facility] 2K 2.0 Ca Dialyzer F180 Heparin 9000 u bolus Access L RC AVF mircera 225 mcg q 2 weeks, last given 7/17 Venofer 50 q week Calcitriol 1 mcg TIW  Past Medical History:  Diagnosis Date  . Anemia   . Cardiomyopathy ([place])    a. 2D Echo 02/02/16 showed moderate LVH, EF 40-45%, diffuse HK, diastolic flattening and systolic flattening, RVH, mild LAE/RAE, mild MR, PASP 81mmHg, elevated CVP. - > Nuclear stress test 01/2016: EF 42%, no ischemia.  . Diabetes mellitus   . ED (erectile dysfunction)   . ESRD on hemodialysis ([place])    a. started HD 07/2016  . GERD (gastroesophageal reflux disease)    occasional  . Hyperlipidemia   . Hypertension    [name]  . Obesity   . Peripheral neuropathy    neuropathy, "tingling in feet"  . PPD positive, treated   . Prostate cancer ([place])   . Pulmonary hypertension ([place])    severe PAH by 01/2016 echo    Past Surgical History:  Procedure Laterality Date  . AMPUTATION  01/26/2012   Procedure: AMPUTATION RAY;  Surgeon: Newt Minion, MD;  Location: [place];  Service: Orthopedics;  Laterality: Right;  Right foot 2nd ray amputation  . AV  FISTULA PLACEMENT Left 10/09/2014   Procedure: CREATION OF LEFT RADIOCEPHALIC ARTERIOVENOUS (AV) FISTULA ;  Surgeon: Serafina Mitchell, MD;  Location: [place];  Service: Vascular;  Laterality: Left;  . COLONOSCOPY    . FRACTURE SURGERY     shoulder surgery  . KNEE CARTILAGE SURGERY    . PARS PLANA VITRECTOMY Left 12/17/2012   Procedure: LEFT PARS PLANA VITRECTOMY WITH 25 GAUGE/ENDO LASER;  Surgeon: Hurman Horn, MD;  Location: [place];  Service: Ophthalmology;  Laterality: Left;  . PARS PLANA VITRECTOMY Right 02/05/2016   Procedure: PARS PLANA VITRECTOMY WITH 25 GAUGE;  Surgeon: Jalene Mullet, MD;  Location: [place];  Service: Ophthalmology;  Laterality: Right;  with block  . PHOTOCOAGULATION WITH LASER Left 12/17/2012   Procedure: PHOTOCOAGULATION WITH LASER;  Surgeon: Hurman Horn, MD;  Location: [place];  Service: Ophthalmology;  Laterality: Left;  . PROSTATECTOMY  2011  . RIGHT/LEFT HEART CATH AND CORONARY ANGIOGRAPHY N/A 09/01/2016   Procedure: Right/Left Heart Cath and Coronary Angiography;  Surgeon: Martinique, Peter M, MD;  Location: [place] [facility];  Service: Cardiovascular;  Laterality: N/A;  . VASECTOMY      Family History  Problem Relation Age of Onset  . Diabetes Mother   . Diabetes Father  Social History:  reports that he has never smoked. He has never used smokeless tobacco. He reports that he does not drink alcohol or use drugs.  Allergies: No Known Allergies  Medications:  Scheduled: . sodium chloride flush  3 mL Intravenous Q12H     Results for orders placed or performed during the hospital encounter of 09/01/16 (from the past 48 hour(s))  Glucose, capillary     Status: Abnormal   Collection Time: 09/01/16  9:55 AM  Result Value Ref Range   Glucose-Capillary 166 (H) 65 - 99 mg/dL  I-STAT 3, arterial blood gas (G3+)     Status: Abnormal   Collection Time: 09/01/16 12:50 PM  Result Value Ref Range   pH, Arterial 7.455 (H) 7.350 - 7.450   pCO2 arterial 44.5 32.0 -  48.0 mmHg   pO2, Arterial 72.0 (L) 83.0 - 108.0 mmHg   Bicarbonate 31.3 (H) 20.0 - 28.0 mmol/L   TCO2 33 0 - 100 mmol/L   O2 Saturation 95.0 %   Acid-Base Excess 7.0 (H) 0.0 - 2.0 mmol/L   Patient temperature HIDE    Sample type ARTERIAL   I-STAT 3, venous blood gas (G3P V)     Status: Abnormal   Collection Time: 09/01/16 12:50 PM  Result Value Ref Range   pH, Ven 7.425 7.250 - 7.430   pCO2, Ven 49.2 44.0 - 60.0 mmHg   pO2, Ven 28.0 (LL) 32.0 - 45.0 mmHg   Bicarbonate 32.2 (H) 20.0 - 28.0 mmol/L   TCO2 34 0 - 100 mmol/L   O2 Saturation 54.0 %   Acid-Base Excess 7.0 (H) 0.0 - 2.0 mmol/L   Patient temperature HIDE    Sample type VENOUS    Comment NOTIFIED PHYSICIAN   Glucose, capillary     Status: Abnormal   Collection Time: 09/01/16  1:23 PM  Result Value Ref Range   Glucose-Capillary 126 (H) 65 - 99 mg/dL    No results found.  ROS: all other systems reviewed and are negative  Blood pressure (!) 147/92, pulse (!) 107, temperature 98.5 F (36.9 C), resp. rate 17, height 6' (1.829 m), weight 114 kg (251 lb 5.2 oz), SpO2 99 %. .  GEN sitting up in bed, appears uncomfortable but not in any significant distress HEENT EOMI, PERRL NECK + JVD PULM: mildly increased WOB, bilateral inspiratory crackles 1/2 way up lung fields CV RRR soft systolic murmur ABD soft, nontender, nondistended, NABS EXT 2+ chronic LE edema with woody induration NEURO  nonfocal SKIN no rashes ACCESS: L RC AVF with good thrill and bruit  Assessment/Plan: 1 SOB/ cough: appears to be vol overloaded.  He denies f/c, CP.  I suspect just pulm edema.  Provide HD tonight and again tomorrow. 2 ESRD: TTS at [facility]  2K bath.  L RC AVF with +T/B.   3 Hypertension/ Vol: expect both to improve with UF.  Will most likely require an EDW adjustment on discharge.   4. 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 5. Metabolic Bone Disease: On calcitriol 1 mcg q treatment, takes Phoslo 2 caps TID before meals. 6. Systolic CHF/  pulm HTN: EF worsening.  Likely will need follow-up with heart failure clinic; per cardiology 7.  Nutrition: usual vitamins   Hodan Wurtz 09/01/2016, 6:06 PM

## 2016-09-01 NOTE — Progress Notes (Signed)
Report given to Flavia Shipper RN

## 2016-09-01 NOTE — Progress Notes (Signed)
Site area: rt groin fa and fv sheaths Site Prior to Removal:  Level 0 Pressure Applied For: 20 minutes Manual:   yes Patient Status During Pull:  stable Post Pull Site:  Level 0 Post Pull Instructions Given:  yes Post Pull Pulses Present: palpable Dressing Applied:  Gauze and tegaderm Bedrest begins @ 7035 Comments:

## 2016-09-01 NOTE — Discharge Instructions (Signed)
Angiogram, Care After °This sheet gives you information about how to care for yourself after your procedure. Your health care provider may also give you more specific instructions. If you have problems or questions, contact your health care provider. °What can I expect after the procedure? °After the procedure, it is common to have bruising and tenderness at the catheter insertion area. °Follow these instructions at home: °Insertion site care  °· Follow instructions from your health care provider about how to take care of your insertion site. Make sure you: °¨ Wash your hands with soap and water before you change your bandage (dressing). If soap and water are not available, use hand sanitizer. °¨ Change your dressing as told by your health care provider. °¨ Leave stitches (sutures), skin glue, or adhesive strips in place. These skin closures may need to stay in place for 2 weeks or longer. If adhesive strip edges start to loosen and curl up, you may trim the loose edges. Do not remove adhesive strips completely unless your health care provider tells you to do that. °· Do not take baths, swim, or use a hot tub until your health care provider approves. °· You may shower 24-48 hours after the procedure or as told by your health care provider. °¨ Gently wash the site with plain soap and water. °¨ Pat the area dry with a clean towel. °¨ Do not rub the site. This may cause bleeding. °· Do not apply powder or lotion to the site. Keep the site clean and dry. °· Check your insertion site every day for signs of infection. Check for: °¨ Redness, swelling, or pain. °¨ Fluid or blood. °¨ Warmth. °¨ Pus or a bad smell. °Activity  °· Rest as told by your health care provider, usually for 1-2 days. °· Do not lift anything that is heavier than 10 lbs. (4.5 kg) or as told by your health care provider. °· Do not drive for 24 hours if you were given a medicine to help you relax (sedative). °· Do not drive or use heavy machinery while  taking prescription pain medicine. °General instructions  °· Return to your normal activities as told by your health care provider, usually in about a week. Ask your health care provider what activities are safe for you. °· If the catheter site starts bleeding, lie flat and put pressure on the site. If the bleeding does not stop, get help right away. This is a medical emergency. °· Drink enough fluid to keep your urine clear or pale yellow. This helps flush the contrast dye from your body. °· Take over-the-counter and prescription medicines only as told by your health care provider. °· Keep all follow-up visits as told by your health care provider. This is important. °Contact a health care provider if: °· You have a fever or chills. °· You have redness, swelling, or pain around your insertion site. °· You have fluid or blood coming from your insertion site. °· The insertion site feels warm to the touch. °· You have pus or a bad smell coming from your insertion site. °· You have bruising around the insertion site. °· You notice blood collecting in the tissue around the catheter site (hematoma). The hematoma may be painful to the touch. °Get help right away if: °· You have severe pain at the catheter insertion area. °· The catheter insertion area swells very fast. °· The catheter insertion area is bleeding, and the bleeding does not stop when you hold steady pressure on   the area. °· The area near or just beyond the catheter insertion site becomes pale, cool, tingly, or numb. °These symptoms may represent a serious problem that is an emergency. Do not wait to see if the symptoms will go away. Get medical help right away. Call your local emergency services (911 in the [place] Do not drive yourself to the hospital. °Summary °· After the procedure, it is common to have bruising and tenderness at the catheter insertion area. °· After the procedure, it is important to rest and drink plenty of fluids. °· Do not take baths,  swim, or use a hot tub until your health care provider says it is okay to do so. You may shower 24-48 hours after the procedure or as told by your health care provider. °· If the catheter site starts bleeding, lie flat and put pressure on the site. If the bleeding does not stop, get help right away. This is a medical emergency. °This information is not intended to replace advice given to you by your health care provider. Make sure you discuss any questions you have with your health care provider. °Document Released: 08/18/2004 Document Revised: 01/05/2016 Document Reviewed: 01/05/2016 °Elsevier Interactive Patient Education © 2017 [employer] ° °

## 2016-09-01 NOTE — Interval H&P Note (Signed)
History and Physical Interval Note:  09/01/2016 12:15 PM  Beverely Low A Mccaig  has presented today for surgery, with the diagnosis of doe  The various methods of treatment have been discussed with the patient and family. After consideration of risks, benefits and other options for treatment, the patient has consented to  Procedure(s): Right/Left Heart Cath and Coronary Angiography (N/A) as a surgical intervention .  The patient's history has been reviewed, patient examined, no change in status, stable for surgery.  I have reviewed the patient's chart and labs.  Questions were answered to the patient's satisfaction.   Cath Lab Visit (complete for each Cath Lab visit)  Clinical Evaluation Leading to the Procedure:   ACS: No.  Non-ACS:    Anginal Classification: CCS II  Anti-ischemic medical therapy: Minimal Therapy (1 class of medications)  Non-Invasive Test Results: Intermediate-risk stress test findings: cardiac mortality 1-3%/year  Prior CABG: No previous CABG       Collier Salina [facility] 09/01/2016 12:15 PM

## 2016-09-01 NOTE — Telephone Encounter (Signed)
Called pt, per Melina Copa, PA-C, to get pt scheduled for a f/u from his heart cath. Left detailed message on pts voicemail re: f/u appt 09/12/16 with Richardson Dopp, PA-C arriving at 8:30.

## 2016-09-01 NOTE — H&P (Signed)
History & Physical    Patient ID: IZEAH VOSSLER MRN: [id number], DOB/AGE: [birth year]   Admit date: 09/01/2016   Primary Physician: Denita Lung, MD Primary Cardiologist: Liane Comber, MD   Patient Profile    56 y/o ? with a h/o NICM, DM, HTN, HL, ESRD on HD, PAH, GERD, and prostate cancer, who underwent diagnostic cath today revealing nonobs CAD with an EF of 25-35%, and mod elevated LVEDP, who is being admitted due to volume overload.  Past Medical History    Past Medical History:  Diagnosis Date  . Anemia   . Cardiomyopathy ([place])    a. 2D Echo 02/02/16 showed moderate LVH, EF 40-45%, diffuse HK, diastolic flattening and systolic flattening, RVH, mild LAE/RAE, mild MR, PASP 83mmHg, elevated CVP. - > Nuclear stress test 01/2016: EF 42%, no ischemia.  . Diabetes mellitus   . ED (erectile dysfunction)   . ESRD on hemodialysis ([place])    a. started HD 07/2016  . GERD (gastroesophageal reflux disease)    occasional  . Hyperlipidemia   . Hypertension    [name]  . Obesity   . Peripheral neuropathy    neuropathy, "tingling in feet"  . PPD positive, treated   . Prostate cancer ([place])   . Pulmonary hypertension ([place])    severe PAH by 01/2016 echo    Past Surgical History:  Procedure Laterality Date  . AMPUTATION  01/26/2012   Procedure: AMPUTATION RAY;  Surgeon: Newt Minion, MD;  Location: [place];  Service: Orthopedics;  Laterality: Right;  Right foot 2nd ray amputation  . AV FISTULA PLACEMENT Left 10/09/2014   Procedure: CREATION OF LEFT RADIOCEPHALIC ARTERIOVENOUS (AV) FISTULA ;  Surgeon: Serafina Mitchell, MD;  Location: [place];  Service: Vascular;  Laterality: Left;  . COLONOSCOPY    . FRACTURE SURGERY     shoulder surgery  . KNEE CARTILAGE SURGERY    . PARS PLANA VITRECTOMY Left 12/17/2012   Procedure: LEFT PARS PLANA VITRECTOMY WITH 25 GAUGE/ENDO LASER;  Surgeon: Hurman Horn, MD;  Location: [place];  Service: Ophthalmology;  Laterality: Left;  . PARS PLANA  VITRECTOMY Right 02/05/2016   Procedure: PARS PLANA VITRECTOMY WITH 25 GAUGE;  Surgeon: Jalene Mullet, MD;  Location: [place];  Service: Ophthalmology;  Laterality: Right;  with block  . PHOTOCOAGULATION WITH LASER Left 12/17/2012   Procedure: PHOTOCOAGULATION WITH LASER;  Surgeon: Hurman Horn, MD;  Location: [place];  Service: Ophthalmology;  Laterality: Left;  . PROSTATECTOMY  2011  . VASECTOMY       Allergies  No Known Allergies  History of Present Illness    Austin Kramer is a 56 y.o. male with history of DM type 2 (since his 76s), HTN, HLD, ESRD (placed on dialysis 07/2016), prostate CA s/p prostatectomy, GERD, peripheral neuropathy, pulmonary HTN, cardiomyopathy, anemia who presents for overdue follow-up. He was seen in 12/2015 for DOE, edema, and occasional chest discomfort. 2D Echo 02/02/16 showed moderate LVH, EF 40-45%, diffuse HK, diastolic flattening and systolic flattening, RVH, mild LAE/RAE, mild MR, PASP 62mmHg, elevated CVP. Nuclear stress test 01/2016: EF 42%, no ischemia. Due to his PASP Dr. Meda Coffee recommended further evaluation of PE. CTA was ordered but never performed - a message was left but it does not appear he returned the call. Creatinine at that time was 7-8 without dialysis.  He has been followed as an outpt with ongoing dyspnea and volume issues.  He was admitted with nausea, vomiting, abdominal pain  and felt to be in ESRD and was started on dialysis. He f/u in clinic on 6/27 and c/o ongoing LEE and DOE, though overall, symptoms have improved since initiation of HD.  Decision was made to pursue [facility] and he presented today for this.  This showed nonobs dzs with an EF of 25-35% and mod elevated LVEDP.  He has had dyspnea in short-stay this afternoon and decision has been made to admit him for further evaluation.  Home Medications    Prior to Admission medications   Medication Sig Start Date End Date Taking? Authorizing Provider  acetaminophen (TYLENOL) 500 MG  tablet Take 500 mg by mouth daily as needed for moderate pain or headache.   Yes [provider]  aspirin 81 MG tablet Take 81 mg by mouth daily.   Yes [provider]  calcitRIOL (ROCALTROL) 0.25 MCG capsule Take 0.25 mcg by mouth daily.   Yes [provider]  calcium acetate (PHOSLO) 667 MG capsule Take 2 capsules (1,334 mg total) by mouth 3 (three) times daily with meals. Patient taking differently: Take 1,334 mg by mouth 2 (two) times daily with a meal.  07/26/16  Yes Orson Eva J, DO  Cholecalciferol (VITAMIN D PO) Take 1 tablet by mouth daily.   Yes [provider]  fish oil-omega-3 fatty acids 1000 MG capsule Take 1 g by mouth daily.   Yes [provider]  furosemide (LASIX) 80 MG tablet Take 80 mg by mouth 2 (two) times daily. 07/22/16  Yes [provider]  insulin detemir (LEVEMIR) 100 UNIT/ML injection Inject 0.1 mLs (10 Units total) into the skin 2 (two) times daily. Patient taking differently: Inject 12 Units into the skin 2 (two) times daily.  07/26/16  Yes Orson Eva J, DO  insulin lispro (HUMALOG) 100 UNIT/ML injection Inject 0.05 mLs (5 Units total) into the skin 3 (three) times daily with meals. For low blood sugar as needed. Sliding scale Patient taking differently: Inject 7 Units into the skin 3 (three) times daily with meals. For low blood sugar as needed. Sliding scale 07/26/16  Yes Bufford Lope, DO  Magnesium 250 MG TABS Take 250 mg by mouth daily.    Yes [provider]  Misc Natural Products (TART CHERRY ADVANCED PO) Take 1 tablet by mouth daily.    Yes [provider]  Multiple Minerals (CALCIUM/MAGNESIUM/ZINC PO) Take 1 tablet by mouth daily.    Yes [provider]  Multiple Vitamin (MULTIVITAMIN WITH MINERALS) TABS Take 1 tablet by mouth daily.   Yes [provider]  rosuvastatin (CRESTOR) 20 MG tablet Take 1 tablet (20 mg total) by mouth daily. 08/04/16  Yes Denita Lung, MD  traMADol  (ULTRAM) 50 MG tablet Take 50 mg by mouth every 6 (six) hours as needed (pain).   Yes [provider]  vitamin B-12 (CYANOCOBALAMIN) 1000 MCG tablet Take 1,000 mcg by mouth daily.   Yes [provider]  VITAMIN E PO Take 1 capsule by mouth daily.   Yes [provider]  amLODipine (NORVASC) 5 MG tablet Take 1 tablet (5 mg total) by mouth daily. 08/09/16 11/07/16  Dunn, Nedra Hai, PA-C  carvedilol (COREG) 12.5 MG tablet Take 1 tablet (12.5 mg total) by mouth 2 (two) times daily. Patient not taking: Reported on 08/30/2016 08/09/16 11/07/16  Charlie Pitter, PA-C    Family History    Family History  Problem Relation Age of Onset  . Diabetes Mother   . Diabetes Father  Social History    Social History   Social History  . Marital status: Married    Spouse name: N/A  . Number of children: 4  . Years of education: N/A   Occupational History  . warehouse    Social History Main Topics  . Smoking status: Never Smoker  . Smokeless tobacco: Never Used  . Alcohol use No  . Drug use: No  . Sexual activity: Yes   Other Topics Concern  . Not on file   Social History Narrative  . No narrative on file     Review of Systems    General:  No chills, fever, night sweats or weight changes.  Cardiovascular:  No chest pain, +++ dyspnea on exertion, +++ edema, no orthopnea, palpitations, paroxysmal nocturnal dyspnea. Dermatological: No rash, lesions/masses Respiratory: No cough, +++ dyspnea Urologic: No hematuria, dysuria Abdominal:   No nausea, vomiting, diarrhea, bright red blood per rectum, melena, or hematemesis Neurologic:  No visual changes, wkns, changes in mental status. All other systems reviewed and are otherwise negative except as noted above.  Physical Exam    Blood pressure (!) 147/92, pulse (!) 107, temperature 98.5 F (36.9 C), resp. rate 17, height 6' (1.829 m), weight 251 lb 5.2 oz (114 kg), SpO2 99 %.  General: Pleasant, NAD Psych: Normal  affect. Neuro: Alert and oriented X 3. Moves all extremities spontaneously. HEENT: Normal  Neck: Supple without bruits.  JVP to jaw. Lungs:  Resp regular and unlabored, bibasilar crackles. Heart: RRR no s3, s4, or murmurs. Abdomen: Soft, non-tender, non-distended, BS + x 4.  Extremities: No clubbing, cyanosis.  1+ bilat LE edema. DP/PT/Radials 2+ and equal bilaterally.  Labs     Lab Results  Component Value Date   WBC 5.4 07/26/2016   HGB 7.2 (L) 07/26/2016   HCT 23.1 (L) 07/26/2016   MCV 75.0 (L) 07/26/2016   PLT 162 07/26/2016   Lab Results  Component Value Date   CHOL 171 11/25/2015   HDL 37 (L) 11/25/2015   LDLCALC 113 11/25/2015   TRIG 107 11/25/2015     Lab Results  Component Value Date   CREATININE 9.32 (H) 07/26/2016   BUN 71 (H) 07/26/2016   NA 131 (L) 07/26/2016   K 3.4 (L) 07/26/2016   CL 97 (L) 07/26/2016   CO2 22 07/26/2016    Radiology Studies    No results found.  ECG & Cardiac Imaging    Cardiac Catheterization 7.20.2018   Prox Cx lesion, 25 %stenosed.  Prox RCA lesion, 25 %stenosed.  There is severe left ventricular systolic dysfunction.  LV end diastolic pressure is moderately elevated.  The left ventricular ejection fraction is 25-35% by visual estimate.  Hemodynamic findings consistent with moderate pulmonary hypertension.   1. Mild nonobstructive CAD 2. Severe LV dysfunction. EF estimated at 25-30%. 3. Moderately elevated LVEDP 4. Moderate pulmonary HTN.  5. Normal cardiac output.    Assessment & Plan    1.  Acute on chronic systolic CHF/NICM:  Pt presented for cath today to eval cor anatomy in the setting of LV dysfxn and CHF.  Cath revealed nonobs dzs with EF of 25-30% and EDP of 29. Plan to admit for volume mgmt and nephrology has been consulted given h/o ESRD requiring HD.  BP has been elevated.  Resume  blocker and add hydral/nitrate therapy for afterload reduction.  No acei/arb/arni/spiro in setting of ESRD.  2. ESRD:   Nephrology to see.  3.  Hypertensive Heart Dzs:  BP elevated.  Resume  blocker.  Adding hydral/nitrate.  4.  Nonobs CAD:  Cont asa.  Cont statin.  5.  Microcytic Anemia:  This has been stable.  Follow.  Signed, Murray Hodgkins, NP 09/01/2016, 4:35 PM

## 2016-09-01 NOTE — Progress Notes (Signed)
Client states more short of breath than usual; resp rate 30; coughing; O2 sats in 80's and O2 started at 2l/min via nasal cannula and Dr Martinique notified of above. Dr Martinique in to see client and per Dr Martinique client to be adm

## 2016-09-01 NOTE — H&P (View-Only) (Signed)
Cardiology Office Note    Date:  08/09/2016  ID:  Austin Kramer, DOB 1960-08-21, MRN [id number] PCP:  Denita Lung, MD  Cardiologist:  Cecilie Kicks NP had reviewed with Dr. Meda Coffee on patient's first visit   Chief Complaint: f/u cardiomyopathy  History of Present Illness:  Austin Kramer is a 56 y.o. male with history of DM type 2 (since his 65s), HTN, HLD, ESRD (placed on dialysis 07/2016), prostate CA s/p prostatectomy, GERD, peripheral neuropathy, pulmonary HTN, cardiomyopathy, anemia who presents for overdue follow-up. He was seen in 12/2015 for DOE, edema, and occasional chest discomfort. 2D Echo 02/02/16 showed moderate LVH, EF 40-45%, diffuse HK, diastolic flattening and systolic flattening, RVH, mild LAE/RAE, mild MR, PASP 5mmHg, elevated CVP. Nuclear stress test 01/2016: EF 42%, no ischemia. Due to his PASP Dr. Meda Coffee recommended further evaluation of PE. CTA was ordered but never performed - a message was left but it does not appear he returned the call. Creatinine at that time was 7-8 without dialysis.  I saw him in 02/2016 at which time he had been seen in the ED for losing his balance and fall in the setting of hypoglycemia. He struck his head. CT did not show any acute intracranial abnormality, just scalp hematoma. Labs revealed minimally elevated troponin (0.1 POC, 0.06 full) but in the setting of CKD with Cr 7.83, BUN 83, K 4.2. He reported continued DOE and had extremely high blood pressure. Close renal f/u was encouraged to determine plan for dialysis as he was felt to have reached the point where this was likely indicated. Initially a referral was sent to [facility] team but they reviewed clinical scenario and agreed that this was more likely a renal issue acutely and further workup could not take place until on HD. More recently he was admitted earlier this month with nausea, vomiting, abdominal pain and felt to be in ESRD and was started on dialysis. Last labs 07/26/16  showed Na 131, K 3.44, glucose 151, BUN 71, Cr 9.32, albumin 2.3, AST 55, ALT 351 (much higher on admission), Hgb 7.2 (previously 8-9). His iron was treated with ESA and IV iron.  He presents back for overdue follow-up. In general he feels better since starting dialysis. Has not had any chest pain but continues to notice exertional dyspnea. No syncope, palpitations or dizziness. Chronic significant LEE persists. Denies problems with hypotension at HD. Denies any bleeding.   Past Medical History:  Diagnosis Date  . Anemia   . Cardiomyopathy ([place])    a. 2D Echo 02/02/16 showed moderate LVH, EF 40-45%, diffuse HK, diastolic flattening and systolic flattening, RVH, mild LAE/RAE, mild MR, PASP 50mmHg, elevated CVP. - > Nuclear stress test 01/2016: EF 42%, no ischemia.  . Diabetes mellitus   . ED (erectile dysfunction)   . ESRD on hemodialysis ([place])    a. started HD 07/2016  . GERD (gastroesophageal reflux disease)    occasional  . Hyperlipidemia   . Hypertension    [name]  . Obesity   . Peripheral neuropathy    neuropathy, "tingling in feet"  . PPD positive, treated   . Prostate cancer ([place])   . Pulmonary hypertension ([place])    severe PAH by 01/2016 echo    Past Surgical History:  Procedure Laterality Date  . AMPUTATION  01/26/2012   Procedure: AMPUTATION RAY;  Surgeon: Newt Minion, MD;  Location: [place];  Service: Orthopedics;  Laterality: Right;  Right foot 2nd ray amputation  .  AV FISTULA PLACEMENT Left 10/09/2014   Procedure: CREATION OF LEFT RADIOCEPHALIC ARTERIOVENOUS (AV) FISTULA ;  Surgeon: Serafina Mitchell, MD;  Location: [place];  Service: Vascular;  Laterality: Left;  . COLONOSCOPY    . FRACTURE SURGERY     shoulder surgery  . KNEE CARTILAGE SURGERY    . PARS PLANA VITRECTOMY Left 12/17/2012   Procedure: LEFT PARS PLANA VITRECTOMY WITH 25 GAUGE/ENDO LASER;  Surgeon: Hurman Horn, MD;  Location: [place];  Service: Ophthalmology;  Laterality: Left;  . PARS PLANA  VITRECTOMY Right 02/05/2016   Procedure: PARS PLANA VITRECTOMY WITH 25 GAUGE;  Surgeon: Jalene Mullet, MD;  Location: [place];  Service: Ophthalmology;  Laterality: Right;  with block  . PHOTOCOAGULATION WITH LASER Left 12/17/2012   Procedure: PHOTOCOAGULATION WITH LASER;  Surgeon: Hurman Horn, MD;  Location: [place];  Service: Ophthalmology;  Laterality: Left;  . PROSTATECTOMY  2011  . VASECTOMY      Current Medications: Current Meds  Medication Sig  . amLODipine (NORVASC) 10 MG tablet Take 10 mg by mouth daily.  Marland Kitchen aspirin 81 MG tablet Take 81 mg by mouth daily.  . calcitRIOL (ROCALTROL) 0.25 MCG capsule Take 0.25 mcg by mouth daily.  . calcium acetate (PHOSLO) 667 MG capsule Take 2 capsules (1,334 mg total) by mouth 3 (three) times daily with meals.  . carvedilol (COREG) 6.25 MG tablet TAKE ONE TABLET BY MOUTH TWICE DAILY  . fish oil-omega-3 fatty acids 1000 MG capsule Take 1 g by mouth daily.  . furosemide (LASIX) 80 MG tablet Take 80 mg by mouth 2 (two) times daily.  . insulin detemir (LEVEMIR) 100 UNIT/ML injection Inject 0.1 mLs (10 Units total) into the skin 2 (two) times daily.  . insulin lispro (HUMALOG) 100 UNIT/ML injection Inject 0.05 mLs (5 Units total) into the skin 3 (three) times daily with meals. For low blood sugar as needed. Sliding scale  . Magnesium 250 MG TABS Take 1 tablet by mouth daily.   . Misc Natural Products (TART CHERRY ADVANCED PO) Take 1 tablet by mouth daily.   . Multiple Minerals (CALCIUM/MAGNESIUM/ZINC PO) Take 1 tablet by mouth daily.   . Multiple Vitamin (MULTIVITAMIN WITH MINERALS) TABS Take 1 tablet by mouth daily.  . rosuvastatin (CRESTOR) 20 MG tablet Take 1 tablet (20 mg total) by mouth daily.  . traMADol (ULTRAM) 50 MG tablet Take 50 mg by mouth every 6 (six) hours as needed (pain).  . vitamin B-12 (CYANOCOBALAMIN) 1000 MCG tablet Take 1,000 mcg by mouth daily.     Allergies:   Patient has no known allergies.   Social History   Social History    . Marital status: Married    Spouse name: N/A  . Number of children: 4  . Years of education: N/A   Occupational History  . warehouse    Social History Main Topics  . Smoking status: Never Smoker  . Smokeless tobacco: Never Used  . Alcohol use No  . Drug use: No  . Sexual activity: Yes   Other Topics Concern  . None   Social History Narrative  . None     Family History:  Family History  Problem Relation Age of Onset  . Diabetes Mother   . Diabetes Father     ROS:   Please see the history of present illness. + persistent cough for the last few months All other systems are reviewed and otherwise negative.    PHYSICAL EXAM:   VS:  BP Marland Kitchen)  116/56   Pulse 94   Ht 6' (1.829 m)   Wt 260 lb 6.4 oz (118.1 kg)   SpO2 95%   BMI 35.32 kg/m   BMI: Body mass index is 35.32 kg/m. GEN: Chronically ill appearing AAM in no acute distress  HEENT: normocephalic, atraumatic Neck: no JVD, carotid bruits, or masses Cardiac: RRR borderline elevated rate; no murmurs, rubs, or gallops, 1+ BLE edema  Respiratory:  clear to auscultation bilaterally, normal work of breathing GI: soft, nontender, nondistended, + BS MS: no deformity or atrophy  Skin: warm and dry, no rash Neuro:  Alert and Oriented x 3, Strength and sensation are intact, follows commands Psych: euthymic mood, full affect  Wt Readings from Last 3 Encounters:  08/09/16 260 lb 6.4 oz (118.1 kg)  08/04/16 257 lb (116.6 kg)  07/26/16 261 lb 0.4 oz (118.4 kg)      Studies/Labs Reviewed:   EKG:  EKG was ordered today and personally reviewed by me and demonstrates NSR 96bpm NSIVCD, nonspecific ST-T changes  Recent Labs: 11/27/2015: B Natriuretic Peptide 439.9 02/15/2016: Magnesium 2.2 07/26/2016: ALT 351; BUN 71; Creatinine, Ser 9.32; Hemoglobin 7.2; Platelets 162; Potassium 3.4; Sodium 131   Lipid Panel    Component Value Date/Time   CHOL 171 11/25/2015 1616   TRIG 107 11/25/2015 1616   HDL 37 (L) 11/25/2015 1616    CHOLHDL 4.6 11/25/2015 1616   VLDL 21 11/25/2015 1616   LDLCALC 113 11/25/2015 1616    Additional studies/ records that were reviewed today include: Summarized above    ASSESSMENT & PLAN:   1. Cardiomyopathy and pulmonary HTN - this was diagnosed in 12/2015. Stress test showed no ischemia but both nuc and echo revealed LV dysfunction with diffuse HK as well as severely elevated PASP. However, this was also in the setting of severely uncontrolled blood pressure in the context of ESRD not yet on dialysis at that time. His symptoms have improved quite a bit but he still reports intermittent cough and DOE. Reviewed with Dr. Meda Coffee. Would recommend to proceed with R/LHC to further evaluate both coronaries, R heart pressures and pulm HTN to determine ideal methods for treatment. Will decrease amlodipine to 5mg  daily and titrate carvedilol to 12.5mg  BID with an eye towards transitioning off amlodipine completely in the future given his LV dysfunction. The patient states he is actually going out of town to [place] in 4 days. He states he has arranged dialysis there. Per discussion with cath lab, in order to accomplish [facility] this week before he leaves, he would require readmission to be able to perform dialysis around the time of his procedure. He is not eager to do so and does not wish to proceed on Friday as he is trying to get ready to leave on that day. I discussed with Dr. Meda Coffee who agrees this is not ideal but we are OK with holding off as long as he understands if there is any change in his symptom status between now and his procedure he is to seek urgent medical attention. He affirms he will do so. Will hold off on scheduling pre-cath labs until he calls back with a date for his procedure. Dr. Meda Coffee recommends this be performed by either Dr. Haroldine Laws or Dr. Aundra Dubin given their expertise with pulmonary HTN. Also discussed importance of staying active in transit to avoid VTE.  2. Essential HTN -  much improved s/p dialysis. 3. ESRD on HD - this is performed T/Th/Sat. 4. Microcytic anemia - will  need following by PCP/renal, being treated by nephrology. Denies any bleeding.  Disposition: F/u to be determined based on scheduling of cath.   Medication Adjustments/Labs and Tests Ordered: Current medicines are reviewed at length with the patient today.  Concerns regarding medicines are outlined above. Medication changes, Labs and Tests ordered today are summarized above and listed in the Patient Instructions accessible in Encounters.   Signed, Charlie Pitter, PA-C  08/09/2016 1:46 PM    [facility] [place], [place], [place]  [address] Phone: [phone]; Fax: [phone]

## 2016-09-01 NOTE — Progress Notes (Signed)
Called to see patient in short stay. Notes increased SOB, cough, and sats dropping into 80s. Patient appears uncomfortable. He is due for dialysis tomorrow but I do not feel safe letting him go home today. Will admit to telemetry. Oxygen in place. Will consult renal. Needs more aggressive fluid removal.   Weber Monnier Martinique MD, [facility]

## 2016-09-01 NOTE — Progress Notes (Signed)
Report called and client transferred to 3-E-2

## 2016-09-02 ENCOUNTER — Encounter (HOSPITAL_COMMUNITY): Payer: Self-pay | Admitting: *Deleted

## 2016-09-02 DIAGNOSIS — I5022 Chronic systolic (congestive) heart failure: Secondary | ICD-10-CM

## 2016-09-02 LAB — BASIC METABOLIC PANEL
Anion gap: 9 (ref 5–15)
BUN: 18 mg/dL (ref 6–20)
CALCIUM: 8.4 mg/dL — AB (ref 8.9–10.3)
CO2: 28 mmol/L (ref 22–32)
CREATININE: 4.06 mg/dL — AB (ref 0.61–1.24)
Chloride: 96 mmol/L — ABNORMAL LOW (ref 101–111)
GFR calc non Af Amer: 15 mL/min — ABNORMAL LOW (ref >60)
GFR, EST AFRICAN AMERICAN: 18 mL/min — AB (ref >60)
GLUCOSE: 158 mg/dL — AB (ref 65–99)
Potassium: 3.8 mmol/L (ref 3.5–5.1)
Sodium: 133 mmol/L — ABNORMAL LOW (ref 135–145)

## 2016-09-02 LAB — CBC
HCT: 32.4 % — ABNORMAL LOW (ref 39.0–52.0)
Hemoglobin: 10 g/dL — ABNORMAL LOW (ref 13.0–17.0)
MCH: 24 pg — AB (ref 26.0–34.0)
MCHC: 30.9 g/dL (ref 30.0–36.0)
MCV: 77.9 fL — ABNORMAL LOW (ref 78.0–100.0)
PLATELETS: 222 10*3/uL (ref 150–400)
RBC: 4.16 MIL/uL — ABNORMAL LOW (ref 4.22–5.81)
RDW: 18.4 % — ABNORMAL HIGH (ref 11.5–15.5)
WBC: 12.3 10*3/uL — ABNORMAL HIGH (ref 4.0–10.5)

## 2016-09-02 LAB — GLUCOSE, CAPILLARY
GLUCOSE-CAPILLARY: 195 mg/dL — AB (ref 65–99)
Glucose-Capillary: 209 mg/dL — ABNORMAL HIGH (ref 65–99)
Glucose-Capillary: 100 mg/dL — ABNORMAL HIGH (ref 65–99)

## 2016-09-02 MED ORDER — ONDANSETRON HCL 4 MG/2ML IJ SOLN
INTRAMUSCULAR | Status: AC
  Administered 2016-09-02: 4 mg via INTRAVENOUS
  Filled 2016-09-02: qty 2

## 2016-09-02 NOTE — Progress Notes (Signed)
HD tx completed @ [phone] w/ one episode of low bp relieved w/ bolus and UF off for a bit, UF goal still met, blood rinsed back, report called to Evette Cristal, RN

## 2016-09-02 NOTE — Progress Notes (Signed)
Progress Note  Patient Name: [name] Date of Encounter: 09/02/2016  Primary Cardiologist: Meda Coffee  Subjective   Feeling well without complaint. No SOB since HD yesterday.  Inpatient Medications    Scheduled Meds: . amLODipine  5 mg Oral Daily  . aspirin EC  81 mg Oral Daily  . calcitRIOL  1 mcg Oral Q T,Th,Sa-HD  . calcium acetate  1,334 mg Oral TID WC  . carvedilol  6.25 mg Oral BID WC  . heparin  5,000 Units Subcutaneous Q8H  . hydrALAZINE  25 mg Oral Q8H  . insulin aspart  0-15 Units Subcutaneous TID WC  . insulin detemir  12 Units Subcutaneous BID  . isosorbide mononitrate  30 mg Oral Daily  . omega-3 acid ethyl esters  1 g Oral Daily  . rosuvastatin  20 mg Oral q1800  . sodium chloride flush  3 mL Intravenous Q12H   Continuous Infusions: . sodium chloride Stopped (09/01/16 2351)  . sodium chloride    . sodium chloride    . sodium chloride     PRN Meds: sodium chloride, sodium chloride, sodium chloride, acetaminophen, heparin, lidocaine (PF), lidocaine-prilocaine, nitroGLYCERIN, ondansetron (ZOFRAN) IV, pentafluoroprop-tetrafluoroeth, sodium chloride flush   Vital Signs    Vitals:   09/02/16 0455 09/02/16 0522 09/02/16 0922 09/02/16 0925  BP: 111/70 105/67 132/74   Pulse: 92 87 91   Resp: (!) 21 (!) 21 18   Temp:  97.8 F (36.6 C) 97.7 F (36.5 C)   TempSrc:  Oral Oral   SpO2: 95% 94% 100% 97%  Weight:  242 lb 15.2 oz (110.2 kg)    Height:        Intake/Output Summary (Last 24 hours) at 09/02/16 0945 Last data filed at 09/02/16 0924  Gross per 24 hour  Intake              243 ml  Output             3000 ml  Net            -2757 ml   Filed Weights   09/01/16 2012 09/02/16 0040 09/02/16 0522  Weight: 249 lb 11.2 oz (113.3 kg) 249 lb 9 oz (113.2 kg) 242 lb 15.2 oz (110.2 kg)    Telemetry    None new - Personally Reviewed  ECG    SR, atypical LBBB - Personally Reviewed  Physical Exam   GEN: No acute distress.   Neck: No  JVD Cardiac: RRR, no murmurs, rubs, or gallops.  Respiratory: Clear to auscultation bilaterally. GI: Soft, nontender, non-distended  MS: No edema; No deformity. Neuro:  Nonfocal  Psych: Normal affect   Labs    Chemistry Recent Labs Lab 09/01/16 1700 09/02/16 0716  NA 134* 133*  K 4.5 3.8  CL 96* 96*  CO2 28 28  GLUCOSE 252* 158*  BUN 39* 18  CREATININE 6.31* 4.06*  CALCIUM 8.9 8.4*  ALBUMIN 2.6*  --   GFRNONAA 9* 15*  GFRAA 10* 18*  ANIONGAP 10 9     Hematology Recent Labs Lab 09/01/16 1805 09/02/16 0716  WBC 8.1 12.3*  RBC 3.83* 4.16*  HGB 9.3* 10.0*  HCT 29.6* 32.4*  MCV 77.3* 77.9*  MCH 24.3* 24.0*  MCHC 31.4 30.9  RDW 18.7* 18.4*  PLT 244 222    Cardiac EnzymesNo results for input(s): TROPONINI in the last 168 hours. No results for input(s): TROPIPOC in the last 168 hours.   BNPNo results for input(s): BNP, PROBNP in  the last 168 hours.   DDimer No results for input(s): DDIMER in the last 168 hours.   Radiology    No results found.  Cardiac Studies   LHC, RHC  Prox Cx lesion, 25 %stenosed.  Prox RCA lesion, 25 %stenosed.  There is severe left ventricular systolic dysfunction.  LV end diastolic pressure is moderately elevated.  The left ventricular ejection fraction is 25-35% by visual estimate.  Hemodynamic findings consistent with moderate pulmonary hypertension.   1. Mild nonobstructive CAD 2. Severe LV dysfunction. EF estimated at 25-30%. 3. Moderately elevated LVEDP 4. Moderate pulmonary HTN.  5. Normal cardiac output.   TTE - LVEF 40-45%, moderate global hypokinesis, flattening of the   intraventricular septum suggestive of pressure overload of the   RV, mild MR, mild LAE, normal RV size with RVH and normal   systolic function, mild RAE, mild TR, RVSP 94 mmHg (consistent   with severe pulmonary hypertension), dilated IVC, no pericardial   effusion. Patient Profile     56 y.o. male h/o NICM, DM, HTN, HL, ESRD on HD, PAH,  GERD, and prostate cancer, who underwent diagnostic cath today revealing nonobs CAD with an EF of 25-35%, and mod elevated LVEDP, who is being admitted due to volume overload.  Assessment & Plan    1.  Acute on chronic systolic CHF/NICM:  Presented to the cath lab yesterday for evaluation of coronary anatomy in the setting of systolic heart failure and pulmonary hypertension. Was found to be volume overloaded and had dialysis yesterday with improvement in his shortness of breath. Plan for dialysis again today, and his shortness of breath is still improved and not on oxygen, Berta Denson plan for discharge.  2. ESRD:   dialysis per nephrology  3.  Hypertensive Heart Dzs:   blood pressure better controlled after starting hydralazine and nitrates. Currently on beta blockers.  4.  Nonobs CAD:   continue aspirin and statin.  Signed, Pattie Flaharty Meredith Leeds, MD  09/02/2016, 9:45 AM

## 2016-09-02 NOTE — Progress Notes (Signed)
   [facility] Progress Note   Assessment/ Plan:   1 SOB/ cough: Vol overlaod.  He denies f/c, CP.  I suspect just pulm edema. HD last night with 3L removed, HD again today.   2 ESRD: TTS at [facility]  3K bath today.  L RC AVF with +T/B.   3 Hypertension/ Vol: expect both to improve with UF.  Will most likely require an EDW adjustment on discharge.   4. 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 5. Metabolic Bone Disease: On calcitriol 1 mcg q treatment, takes Phoslo 2 caps TID before meals. 6. Systolic CHF/ pulm HTN: EF worsening.  Likely will need follow-up with heart failure clinic; per cardiology 7.  Nutrition: usual vitamins 8.  Leukocytosis: likely reactive, could consider CXR after HD today to ensure no pneumonia (I don't have a high suspicion) 9.  Dispo: from a renal perspective could go home after HD today as long as not requiring O2  Subjective:    Feeling better; however still requiring O2 and still can't lay flat   Objective:   BP 105/67 (BP Location: Right Arm)   Pulse 87   Temp 97.8 F (36.6 C) (Oral)   Resp (!) 21   Ht 6' (1.829 m)   Wt 110.2 kg (242 lb 15.2 oz)   SpO2 94%   BMI 32.95 kg/m   Physical Exam: GEN sitting up in bed, less uncomfortable than yesterday HEENT EOMI, PERRL NECK + JVD PULM: mildly increased WOB, bilateral inspiratory crackles 1/3 way up lung fields, improved CV RRR soft systolic murmur ABD soft, nontender, nondistended, NABS EXT 2+ chronic LE edema with woody induration NEURO  nonfocal SKIN no rashes ACCESS: L RC AVF with good thrill and bruit  Labs: BMET  Recent Labs Lab 09/01/16 1700 09/02/16 0716  NA 134* 133*  K 4.5 3.8  CL 96* 96*  CO2 28 28  GLUCOSE 252* 158*  BUN 39* 18  CREATININE 6.31* 4.06*  CALCIUM 8.9 8.4*  PHOS 5.0*  --    CBC  Recent Labs Lab 09/01/16 1805 09/02/16 0716  WBC 8.1 12.3*  HGB 9.3* 10.0*  HCT 29.6* 32.4*  MCV 77.3* 77.9*  PLT 244 222    @IMGRELPRIORS @ Medications:    .  amLODipine  5 mg Oral Daily  . aspirin EC  81 mg Oral Daily  . calcitRIOL  1 mcg Oral Q T,Th,Sa-HD  . calcium acetate  1,334 mg Oral TID WC  . carvedilol  6.25 mg Oral BID WC  . heparin  5,000 Units Subcutaneous Q8H  . hydrALAZINE  25 mg Oral Q8H  . insulin aspart  0-15 Units Subcutaneous TID WC  . insulin detemir  12 Units Subcutaneous BID  . isosorbide mononitrate  30 mg Oral Daily  . omega-3 acid ethyl esters  1 g Oral Daily  . rosuvastatin  20 mg Oral q1800  . sodium chloride flush  3 mL Intravenous Q12H    Madelon Lips, MD [facility] pgr [phone] 09/02/2016, 9:17 AM

## 2016-09-02 NOTE — Discharge Summary (Signed)
Discharge Summary    Patient ID: Austin Kramer,  MRN: [id number], DOB/AGE: 56/11/1960 56 y.o.  Admit date: 09/01/2016 Discharge date: 09/02/2016  Primary Care Provider: Denita Lung Primary Cardiologist: Meda Coffee   Discharge Diagnoses    Principal Problem:   Chronic systolic CHF (congestive heart failure) ([place]) Active Problems:   Type 2 diabetes mellitus with stage 5 chronic kidney disease not on chronic dialysis, with long-term current use of insulin ([place])   Hypertension associated with diabetes ([place])   Hyperlipidemia LDL goal <70   Obesity (BMI 30-39.9)   ESRD on dialysis [facility])   Acute on chronic systolic (congestive) heart failure ([facility])   Allergies No Known Allergies  Diagnostic Studies/Procedures    R/LHC: 09/01/16  Conclusion     Prox Cx lesion, 25 %stenosed.  Prox RCA lesion, 25 %stenosed.  There is severe left ventricular systolic dysfunction.  LV end diastolic pressure is moderately elevated.  The left ventricular ejection fraction is 25-35% by visual estimate.  Hemodynamic findings consistent with moderate pulmonary hypertension.   1. Mild nonobstructive CAD 2. Severe LV dysfunction. EF estimated at 25-30%. 3. Moderately elevated LVEDP 4. Moderate pulmonary HTN.  5. Normal cardiac output.   Plan: intensify medical therapy for CHF. Based on findings of right heart cath would recommend additional fluid removal at dialysis.     _____________   History of Present Illness     Austin Kramer a 56 y.o.malewith history of DM type 2 (since his 33s), HTN, HLD, ESRD (placed on dialysis 07/2016), prostate CA s/p prostatectomy, GERD, peripheral neuropathy, pulmonary HTN, cardiomyopathy, anemia who presents for overdue follow-up. He was seen in 12/2015 for DOE, edema, and occasional chest discomfort. 2D Echo 02/02/16 showed moderate LVH, EF 40-45%, diffuse HK, diastolic flattening and systolic flattening, RVH, mild LAE/RAE, mild MR, PASP 11mmHg,  elevated CVP. Nuclear stress test 01/2016: EF 42%, no ischemia. Due to his PASP Dr. Meda Coffee recommended further evaluation of PE. CTA was ordered but never performed - a message was left but it does not appear he returned the call. Creatinine at that time was 7-8 without dialysis.  He has been followed as an outpt with ongoing dyspnea and volume issues.  He was admitted with nausea, vomiting, abdominal pain and felt to be in ESRD and was started on dialysis. He f/u in clinic on 6/27 and c/o ongoing LEE and DOE, though overall, symptoms have improved since initiation of HD.  Decision was made to pursue [facility] and he presented 09/01/16 for this.  This showed nonobs dzs with an EF of 25-35% and mod elevated LVEDP.  He had dyspnea in short-stay that afternoon and decision was been made to admit him for further evaluation.  Hospital Course     Consultants: Nephrology  He was admitted and nephrology consulted given hx of ESRD. He was noted to be hypertensive and BB therapy was resumed at lower dose. Hydralazine/nitrate therapy added for afterload reduction. Seen by Dr. Hollie Salk who felt dyspnea was 2/2 pulmonary edema. He underwent HD that evening and again the following day with significant improvement in respiratory status. Able to be weaned from O2. Blood pressure improved with resuming BB and the addition of Imdur/hydralazine, along with HD. Austin Kramer plan to resume home dose of BB, with instructions to follow blood pressure at home and bring to follow up appt.   He was seen by Dr. Curt Bears and determined stable for discharge home. Message was sent for follow up in the office. Medications are  listed below.  _____________  Discharge Vitals Blood pressure 130/78, pulse 88, temperature 98.3 F (36.8 C), temperature source Oral, resp. rate 18, height 6' (1.829 m), weight 242 lb 15.2 oz (110.2 kg), SpO2 95 %.  Filed Weights   09/02/16 0040 09/02/16 0522 09/02/16 1346  Weight: 249 lb 9 oz (113.2 kg) 242 lb 15.2 oz  (110.2 kg) 242 lb 15.2 oz (110.2 kg)    Labs & Radiologic Studies    CBC  Recent Labs  09/01/16 1805 09/02/16 0716  WBC 8.1 12.3*  HGB 9.3* 10.0*  HCT 29.6* 32.4*  MCV 77.3* 77.9*  PLT 244 353   Basic Metabolic Panel  Recent Labs  09/01/16 1700 09/02/16 0716  NA 134* 133*  K 4.5 3.8  CL 96* 96*  CO2 28 28  GLUCOSE 252* 158*  BUN 39* 18  CREATININE 6.31* 4.06*  CALCIUM 8.9 8.4*  PHOS 5.0*  --    Liver Function Tests  Recent Labs  09/01/16 1700  ALBUMIN 2.6*   No results for input(s): LIPASE, AMYLASE in the last 72 hours. Cardiac Enzymes No results for input(s): CKTOTAL, CKMB, CKMBINDEX, TROPONINI in the last 72 hours. BNP Invalid input(s): POCBNP D-Dimer No results for input(s): DDIMER in the last 72 hours. Hemoglobin A1C No results for input(s): HGBA1C in the last 72 hours. Fasting Lipid Panel No results for input(s): CHOL, HDL, LDLCALC, TRIG, CHOLHDL, LDLDIRECT in the last 72 hours. Thyroid Function Tests No results for input(s): TSH, T4TOTAL, T3FREE, THYROIDAB in the last 72 hours.  Invalid input(s): FREET3 _____________  No results found. Disposition   Pt is being discharged home today in good condition.  Follow-up Plans & Appointments    Follow-up Information    Dorothy Spark, MD Follow up.   Specialty:  Cardiology Why:  The office Dock Baccam call you with a follow up appt within 2-3 days.  Contact information: [place] [id number] [phone]          Discharge Instructions    Call MD for:  difficulty breathing, headache or visual disturbances    Complete by:  As directed    Call MD for:  redness, tenderness, or signs of infection (pain, swelling, redness, odor or green/yellow discharge around incision site)    Complete by:  As directed    Diet - low sodium heart healthy    Complete by:  As directed    Discharge instructions    Complete by:  As directed    Groin Site Care Refer to this sheet in  the next few weeks. These instructions provide you with information on caring for yourself after your procedure. Your caregiver may also give you more specific instructions. Your treatment has been planned according to current medical practices, but problems sometimes occur. Call your caregiver if you have any problems or questions after your procedure. HOME CARE INSTRUCTIONS You may shower 24 hours after the procedure. Remove the bandage (dressing) and gently wash the site with plain soap and water. Gently pat the site dry.  Do not apply powder or lotion to the site.  Do not sit in a bathtub, swimming pool, or whirlpool for 5 to 7 days.  No bending, squatting, or lifting anything over 10 pounds (4.5 kg) as directed by your caregiver.  Inspect the site at least twice daily.  Do not drive home if you are discharged the same day of the procedure. Have someone else drive you.  You may drive 24 hours  after the procedure unless otherwise instructed by your caregiver.  What to expect: Any bruising Quindon Denker usually fade within 1 to 2 weeks.  Blood that collects in the tissue (hematoma) may be painful to the touch. It should usually decrease in size and tenderness within 1 to 2 weeks.  SEEK IMMEDIATE MEDICAL CARE IF: You have unusual pain at the groin site or down the affected leg.  You have redness, warmth, swelling, or pain at the groin site.  You have drainage (other than a small amount of blood on the dressing).  You have chills.  You have a fever or persistent symptoms for more than 72 hours.  You have a fever and your symptoms suddenly get worse.  Your leg becomes pale, cool, tingly, or numb.  You have heavy bleeding from the site. Hold pressure on the site. .  Please follow your blood pressure at home and bring a log to your appt to determine further management of your blood pressure medications.   Increase activity slowly    Complete by:  As directed       Discharge Medications   Current  Discharge Medication List    CONTINUE these medications which have NOT CHANGED   Details  acetaminophen (TYLENOL) 500 MG tablet Take 500 mg by mouth daily as needed for moderate pain or headache.    aspirin 81 MG tablet Take 81 mg by mouth daily.    calcitRIOL (ROCALTROL) 0.25 MCG capsule Take 0.25 mcg by mouth daily.    calcium acetate (PHOSLO) 667 MG capsule Take 2 capsules (1,334 mg total) by mouth 3 (three) times daily with meals. Qty: 90 capsule, Refills: 0    Cholecalciferol (VITAMIN D PO) Take 1 tablet by mouth daily.    fish oil-omega-3 fatty acids 1000 MG capsule Take 1 g by mouth daily.    furosemide (LASIX) 80 MG tablet Take 80 mg by mouth 2 (two) times daily. Refills: 2    insulin detemir (LEVEMIR) 100 UNIT/ML injection Inject 0.1 mLs (10 Units total) into the skin 2 (two) times daily. Qty: 20 mL, Refills: 0    insulin lispro (HUMALOG) 100 UNIT/ML injection Inject 0.05 mLs (5 Units total) into the skin 3 (three) times daily with meals. For low blood sugar as needed. Sliding scale Qty: 20 mL, Refills: 0    Magnesium 250 MG TABS Take 250 mg by mouth daily.     Misc Natural Products (TART CHERRY ADVANCED PO) Take 1 tablet by mouth daily.     Multiple Minerals (CALCIUM/MAGNESIUM/ZINC PO) Take 1 tablet by mouth daily.     Multiple Vitamin (MULTIVITAMIN WITH MINERALS) TABS Take 1 tablet by mouth daily.    rosuvastatin (CRESTOR) 20 MG tablet Take 1 tablet (20 mg total) by mouth daily. Qty: 90 tablet, Refills: 3   Associated Diagnoses: Hyperlipidemia LDL goal <70    traMADol (ULTRAM) 50 MG tablet Take 50 mg by mouth every 6 (six) hours as needed (pain).    vitamin B-12 (CYANOCOBALAMIN) 1000 MCG tablet Take 1,000 mcg by mouth daily.    VITAMIN E PO Take 1 capsule by mouth daily.    amLODipine (NORVASC) 5 MG tablet Take 1 tablet (5 mg total) by mouth daily. Qty: 30 tablet, Refills: 0    carvedilol (COREG) 12.5 MG tablet Take 1 tablet (12.5 mg total) by mouth 2 (two)  times daily. Qty: 180 tablet, Refills: 3          Outstanding Labs/Studies   N/A  Duration of Discharge  Encounter   Greater than 30 minutes including physician time.  Signed, Reino Bellis NP-C 09/02/2016, 4:32 PM  I have seen and examined this patient with Reino Bellis.  Agree with above, note added to reflect my findings.  On exam, RRR, no murmurs, lungs clear. Had RHC and LHC yesterday without CAD but severe LV dysfunction. Developed SOB after and was found to be in HF. Had dialysis yesterday and today and feeling much improved SOB. Plan to discharge today with follow up in cardiology clinic..    Kenta Laster M. Vadis Slabach MD 09/02/2016 6:04 PM

## 2016-09-02 NOTE — Progress Notes (Signed)
HD tx initiated via 15G x2 w/o problem, pull/push/fluish equally w/o problem, VSS w/ increased bp, will cont to monitor while on HD tx

## 2016-09-03 NOTE — Progress Notes (Signed)
Patient was to be D/C'd after HD, but day shift RN could not print AVS secondary to it being incomplete. She texted MD without response. This Probation officer also texted on call MD ("[employer]"). AVS completed as patient returning to his established HD center. D/C teaching was done by [facility] RN. She also took him down to short stay for his P/U for home.

## 2016-09-05 ENCOUNTER — Telehealth: Payer: Self-pay | Admitting: *Deleted

## 2016-09-05 DIAGNOSIS — N186 End stage renal disease: Secondary | ICD-10-CM | POA: Diagnosis not present

## 2016-09-05 DIAGNOSIS — N2581 Secondary hyperparathyroidism of renal origin: Secondary | ICD-10-CM | POA: Diagnosis not present

## 2016-09-05 NOTE — Telephone Encounter (Signed)
Placed call to pt to make sure he got my message re: his f/u appt 09/12/16. I left another message for pt to call back.

## 2016-09-07 DIAGNOSIS — N186 End stage renal disease: Secondary | ICD-10-CM | POA: Diagnosis not present

## 2016-09-07 DIAGNOSIS — N2581 Secondary hyperparathyroidism of renal origin: Secondary | ICD-10-CM | POA: Diagnosis not present

## 2016-09-09 DIAGNOSIS — N186 End stage renal disease: Secondary | ICD-10-CM | POA: Diagnosis not present

## 2016-09-09 DIAGNOSIS — N2581 Secondary hyperparathyroidism of renal origin: Secondary | ICD-10-CM | POA: Diagnosis not present

## 2016-09-11 NOTE — Progress Notes (Addendum)
Cardiology Office Note:    Date:  09/12/2016   ID:  Austin Kramer, DOB August 22, 1960, MRN [id number]  PCP:  Denita Lung, MD  Cardiologist:  Dr. Ena Dawley    Referring MD: Denita Lung, MD   Chief Complaint  Patient presents with  . Hospitalization Follow-up    Admitted for dialysis after cardiac catheterization    History of Present Illness:    Austin Kramer is a 56 y.o. male with a hx of DM 2 (since his 25s), HTN, HL, ESRD (placed on dialysis 07/2016), prostate CA s/p prostatectomy, GERD, peripheral neuropathy, pulmonary HTN, cardiomyopathy, anemia.  He was initially seen in 11/17 for chest pain and shortness of breath.  Echo in 12/17 demonstrated moderate LVH, EF 40-45%, diffuse HK, diastolic flattening and systolic flattening, RVH, mild LAE/RAE, mild MR, PASP 16mmHg, elevated CVP.  Myoview in 12/17 demonstrated EF 42%, no ischemia.  CTA was recommended to r/o pulmonary embolism but was never done.  The patient was not yet on dialysis but was ultimately started on dialysis in 6/18.  Last seen in 6/18 by Melina Copa, PA-C.  R/L heart cath was arranged for 09/01/16.  This demonstrated mild nonobstructive CAD, severe LV dysfunction with EF 25-30, moderately elevated LVEDP and mod pulmonary HTN.  Recommendation was for intensification of CHF therapy as well as additional fluid removal at dialysis.  He was admitted overnight and underwent dialysis 2 with improvement in his volume status. Hydralazine and nitrates were added to his medical regimen.  Austin Kramer returns for follow-up. He is here alone. He is on dialysis Tuesday, Thursday, Saturday. He was not given a prescription for hydralazine or isosorbide at discharge. His blood pressure has run low at dialysis recently. Otherwise, he feels much better. His breathing is improved. He denies PND. He has occasional chest discomfort that is somewhat atypical. This does feel better after dialysis. He denies syncope.  Prior CV studies:    The following studies were reviewed today:  R/L heart cath 09/01/16 LAD normal LCx proximal 25 RCA proximal 25 EF 25-35 Mean RA 8, PASP 60, mean PA 43, LVEDP 29  Myoview 12/17 EF 40, no ischemia, intermediate risk 2/2 reduced EF  Echo 12/17 Mod LVH, EF 40-45, diffuse HK, mild MR, mild LAE, RVH, nl RVSF, mild RAE, trivial TR, PASP 94  Past Medical History:  Diagnosis Date  . Anemia   . CAD (coronary artery disease) 09/12/2016   R/L heart cath 09/01/16:  nl LAD, pLCx 25, pRCA 25, EF 25-35; Mean RA 8, PASP 60, mean PA 43, LVEDP 29  . Chronic systolic CHF (congestive heart failure) ([place])    Echo 12/17: EF 40-45 // L HC 7/18: EF 25-35, LVEDP 29  . Diabetes mellitus   . ED (erectile dysfunction)   . ESRD on hemodialysis ([place])    a. started HD 07/2016  . GERD (gastroesophageal reflux disease)    occasional  . Hyperlipidemia   . Hypertension    [name]  . NICM (nonischemic cardiomyopathy) ([place])    a. Echo 12/17-mod LVH, EF 40-45, diff HK, diast+syst flattening, RVH, mild LAE/RAE, mild MR, PASP 9mmHg //  Nuclear stress test 01/2016: EF 42, no ischemia. // R/L Ht Cath 7/18: min cor plaque; elev LVEDP, elev PASP >>   . Obesity   . Peripheral neuropathy    neuropathy, "tingling in feet"  . PPD positive, treated   . Prostate cancer ([place])   . Pulmonary hypertension ([place])  severe PAH by 01/2016 echo    Past Surgical History:  Procedure Laterality Date  . AMPUTATION  01/26/2012   Procedure: AMPUTATION RAY;  Surgeon: Newt Minion, MD;  Location: [facility];  Service: Orthopedics;  Laterality: Right;  Right foot 2nd ray amputation  . AV FISTULA PLACEMENT Left 10/09/2014   Procedure: CREATION OF LEFT RADIOCEPHALIC ARTERIOVENOUS (AV) FISTULA ;  Surgeon: Serafina Mitchell, MD;  Location: [facility];  Service: Vascular;  Laterality: Left;  . COLONOSCOPY    . FRACTURE SURGERY     shoulder surgery  . KNEE CARTILAGE SURGERY    . PARS PLANA VITRECTOMY Left 12/17/2012   Procedure: LEFT  PARS PLANA VITRECTOMY WITH 25 GAUGE/ENDO LASER;  Surgeon: Hurman Horn, MD;  Location: [facility];  Service: Ophthalmology;  Laterality: Left;  . PARS PLANA VITRECTOMY Right 02/05/2016   Procedure: PARS PLANA VITRECTOMY WITH 25 GAUGE;  Surgeon: Jalene Mullet, MD;  Location: [facility];  Service: Ophthalmology;  Laterality: Right;  with block  . PHOTOCOAGULATION WITH LASER Left 12/17/2012   Procedure: PHOTOCOAGULATION WITH LASER;  Surgeon: Hurman Horn, MD;  Location: [facility];  Service: Ophthalmology;  Laterality: Left;  . PROSTATECTOMY  2011  . RIGHT/LEFT HEART CATH AND CORONARY ANGIOGRAPHY N/A 09/01/2016   Procedure: Right/Left Heart Cath and Coronary Angiography;  Surgeon: Martinique, Peter M, MD;  Location: [facility];  Service: Cardiovascular;  Laterality: N/A;  . VASECTOMY      Current Medications: Current Meds  Medication Sig  . acetaminophen (TYLENOL) 500 MG tablet Take 500 mg by mouth daily as needed for moderate pain or headache.  Marland Kitchen aspirin 81 MG tablet Take 81 mg by mouth daily.  . calcitRIOL (ROCALTROL) 0.25 MCG capsule Take 0.25 mcg by mouth daily.  . calcium acetate (PHOSLO) 667 MG capsule Take 2 capsules (1,334 mg total) by mouth 3 (three) times daily with meals.  . carvedilol (COREG) 12.5 MG tablet Take 1 tablet (12.5 mg total) by mouth 2 (two) times daily.  . Cholecalciferol (VITAMIN D PO) Take 1 tablet by mouth daily.  . fish oil-omega-3 fatty acids 1000 MG capsule Take 1 g by mouth daily.  . furosemide (LASIX) 80 MG tablet Take 80 mg by mouth 2 (two) times daily.  . insulin detemir (LEVEMIR) 100 UNIT/ML injection Inject 0.1 mLs (10 Units total) into the skin 2 (two) times daily.  . insulin lispro (HUMALOG) 100 UNIT/ML injection Inject 7 Units into the skin 3 (three) times daily before meals.  . Magnesium 250 MG TABS Take 250 mg by mouth daily.   . Misc Natural Products (TART CHERRY ADVANCED PO) Take 1 tablet by mouth daily.   . Multiple Minerals (CALCIUM/MAGNESIUM/ZINC PO) Take 1  tablet by mouth daily.   . Multiple Vitamin (MULTIVITAMIN WITH MINERALS) TABS Take 1 tablet by mouth daily.  . rosuvastatin (CRESTOR) 20 MG tablet Take 1 tablet (20 mg total) by mouth daily.  . traMADol (ULTRAM) 50 MG tablet Take 50 mg by mouth every 6 (six) hours as needed (pain).  . vitamin B-12 (CYANOCOBALAMIN) 1000 MCG tablet Take 1,000 mcg by mouth daily.  Marland Kitchen VITAMIN E PO Take 1 capsule by mouth daily.  . [DISCONTINUED] amLODipine (NORVASC) 5 MG tablet Take 1 tablet (5 mg total) by mouth daily.     Allergies:   Patient has no known allergies.   Social History   Social History  . Marital status: Married    Spouse name: N/A  . Number of children: 4  .  Years of education: N/A   Occupational History  . warehouse    Social History Main Topics  . Smoking status: Never Smoker  . Smokeless tobacco: Never Used  . Alcohol use No  . Drug use: No  . Sexual activity: Yes   Other Topics Concern  . None   Social History Narrative  . None     Family Hx: The patient's family history includes Diabetes in his father and mother.  ROS:   Please see the history of present illness.    Review of Systems  Neurological: Positive for dizziness.   All other systems reviewed and are negative.   EKGs/Labs/Other Test Reviewed:    EKG:  EKG is  ordered today.  The ekg ordered today demonstrates NSR, HR 80, right axis, IVCD, QRS 152, QTC 525, no change since prior tracing  Recent Labs: 11/27/2015: B Natriuretic Peptide 439.9 02/15/2016: Magnesium 2.2 07/26/2016: ALT 351 09/02/2016: BUN 18; Creatinine, Ser 4.06; Hemoglobin 10.0; Platelets 222; Potassium 3.8; Sodium 133   Recent Lipid Panel Lab Results  Component Value Date/Time   CHOL 171 11/25/2015 04:16 PM   TRIG 107 11/25/2015 04:16 PM   HDL 37 (L) 11/25/2015 04:16 PM   CHOLHDL 4.6 11/25/2015 04:16 PM   LDLCALC 113 11/25/2015 04:16 PM    Physical Exam:    VS:  BP 128/70   Pulse 80   Ht 6' (1.829 m)   Wt 248 lb (112.5 kg)    BMI 33.63 kg/m     Wt Readings from Last 3 Encounters:  09/12/16 248 lb (112.5 kg)  09/02/16 235 lb 8 oz (106.8 kg)  08/09/16 260 lb 6.4 oz (118.1 kg)     Physical Exam  Constitutional: He is oriented to person, place, and time. He appears well-developed and well-nourished.  Eyes: No scleral icterus.  Neck: JVD ( JVP 5-6 cm) present.  Cardiovascular: Normal rate, regular rhythm and normal heart sounds.   No murmur heard. Pulmonary/Chest: He has no wheezes. He has rales (faint bibasilar crackles bilaterally).  Abdominal: There is no tenderness.  Musculoskeletal: He exhibits edema (Trace-1+ bilateral ankle edema). He exhibits no deformity (Right femoral arteriotomy site without hematoma or bruit).  Neurological: He is alert and oriented to person, place, and time.  Skin: Skin is warm and dry.  Psychiatric: He has a normal mood and affect.    ASSESSMENT:    1. Chronic systolic CHF (congestive heart failure) ([facility])   2. NICM (nonischemic cardiomyopathy) ([facility])   3. Coronary artery disease involving native coronary artery of native heart without angina pectoris   4. Hypertensive heart disease with chronic systolic congestive heart failure ([place])   5. ESRD (end stage renal disease) ([place])   6. Pulmonary hypertension, unspecified ([place])    PLAN:    In order of problems listed above:  1. Chronic systolic CHF (congestive heart failure) ([facility]) -  NYHA 2. He is doing well with improved symptoms. Volume is managed by dialysis. He has some evidence of volume overload today on exam but is due for dialysis today.  Continue beta blocker. He did not receive a prescription for hydralazine or nitrates at discharge. He has had low blood pressures at dialysis.  -  DC amlodipine  -  Start hydralazine 25 mg 3 times a day  -  Attempt to add nitrates at next visit  2. NICM (nonischemic cardiomyopathy) ([place]) EF is 40-45 in December and 25-35 at cardiac catheterization recently. Continue to maximize  medical therapy implant repeat echo  at some point in the next 3 months.  3. Coronary artery disease Minimal plaque at cardiac catheterization. Continue aspirin, statin. He denies angina.  4. Hypertensive heart disease with chronic systolic congestive heart failure ([place]) The patient's blood pressure is controlled on his current regimen.  Adjust medications for heart failure as outlined.    5.ESRD (end stage renal disease) ([facility]) Tuesday, Thursday, Saturday dialysis.  6. Pulmonary hypertension, unspecified ([place])  Secondary to volume excess from heart failure.  Dispo:  Return in about 2 weeks (around 09/26/2016) for Routine Follow Up, w/ Richardson Dopp, PA-C.   Medication Adjustments/Labs and Tests Ordered: Current medicines are reviewed at length with the patient today.  Concerns regarding medicines are outlined above.  Tests Ordered: Orders Placed This Encounter  Procedures  . EKG 12-Lead   Medication Changes: Meds ordered this encounter  Medications  . hydrALAZINE (APRESOLINE) 25 MG tablet    Sig: Take 1 tablet (25 mg total) by mouth 3 (three) times daily.    Dispense:  270 tablet    Refill:  3    Signed, Richardson Dopp, PA-C  09/12/2016 9:59 AM    [facility] [place], [place], [place]  [address] Phone: [phone]; Fax: [phone]

## 2016-09-12 ENCOUNTER — Encounter (INDEPENDENT_AMBULATORY_CARE_PROVIDER_SITE_OTHER): Payer: Self-pay

## 2016-09-12 ENCOUNTER — Encounter: Payer: Self-pay | Admitting: Physician Assistant

## 2016-09-12 ENCOUNTER — Ambulatory Visit (INDEPENDENT_AMBULATORY_CARE_PROVIDER_SITE_OTHER): Payer: BLUE CROSS/BLUE SHIELD | Admitting: Physician Assistant

## 2016-09-12 VITALS — BP 128/70 | HR 80 | Ht 72.0 in | Wt 248.0 lb

## 2016-09-12 DIAGNOSIS — I110 Hypertensive heart disease with heart failure: Secondary | ICD-10-CM

## 2016-09-12 DIAGNOSIS — E1129 Type 2 diabetes mellitus with other diabetic kidney complication: Secondary | ICD-10-CM | POA: Diagnosis not present

## 2016-09-12 DIAGNOSIS — I5022 Chronic systolic (congestive) heart failure: Secondary | ICD-10-CM | POA: Diagnosis not present

## 2016-09-12 DIAGNOSIS — Z992 Dependence on renal dialysis: Secondary | ICD-10-CM | POA: Diagnosis not present

## 2016-09-12 DIAGNOSIS — N186 End stage renal disease: Secondary | ICD-10-CM

## 2016-09-12 DIAGNOSIS — I272 Pulmonary hypertension, unspecified: Secondary | ICD-10-CM

## 2016-09-12 DIAGNOSIS — I2510 Atherosclerotic heart disease of native coronary artery without angina pectoris: Secondary | ICD-10-CM

## 2016-09-12 DIAGNOSIS — I428 Other cardiomyopathies: Secondary | ICD-10-CM | POA: Diagnosis not present

## 2016-09-12 DIAGNOSIS — N2581 Secondary hyperparathyroidism of renal origin: Secondary | ICD-10-CM | POA: Diagnosis not present

## 2016-09-12 HISTORY — DX: Hypertensive heart disease with heart failure: I11.0

## 2016-09-12 HISTORY — DX: Atherosclerotic heart disease of native coronary artery without angina pectoris: I25.10

## 2016-09-12 MED ORDER — HYDRALAZINE HCL 25 MG PO TABS: 25.0000 mg | ORAL_TABLET | Freq: Three times a day (TID) | ORAL | 3 refills | Status: DC

## 2016-09-12 NOTE — Patient Instructions (Addendum)
Medication Instructions:  1. STOP NORVASC  2. START HYDRALAZINE 25 MG. TAKE 1 TABLET THREE TIMES DAILY; SPACE OUT EVERY 8 HOURS  Labwork: NONE ORDERED TODAY  Testing/Procedures: NONE ORDERED TODAY  Follow-Up: 1. ON 09/20/16 @ 11:45 WITH SCOTT WEAVER, PAC   2. ON 11/29/16 @ 8:40 WITH DR. Meda Coffee  Any Other Special Instructions Will Be Listed Below (If Applicable).     If you need a refill on your cardiac medications before your next appointment, please call your pharmacy.

## 2016-09-14 DIAGNOSIS — N186 End stage renal disease: Secondary | ICD-10-CM | POA: Diagnosis not present

## 2016-09-14 DIAGNOSIS — N2581 Secondary hyperparathyroidism of renal origin: Secondary | ICD-10-CM | POA: Diagnosis not present

## 2016-09-15 DIAGNOSIS — N2581 Secondary hyperparathyroidism of renal origin: Secondary | ICD-10-CM | POA: Diagnosis not present

## 2016-09-15 DIAGNOSIS — N186 End stage renal disease: Secondary | ICD-10-CM | POA: Diagnosis not present

## 2016-09-18 DIAGNOSIS — E113511 Type 2 diabetes mellitus with proliferative diabetic retinopathy with macular edema, right eye: Secondary | ICD-10-CM | POA: Diagnosis not present

## 2016-09-19 DIAGNOSIS — N186 End stage renal disease: Secondary | ICD-10-CM | POA: Diagnosis not present

## 2016-09-19 DIAGNOSIS — N2581 Secondary hyperparathyroidism of renal origin: Secondary | ICD-10-CM | POA: Diagnosis not present

## 2016-09-20 ENCOUNTER — Ambulatory Visit: Payer: BLUE CROSS/BLUE SHIELD | Admitting: Physician Assistant

## 2016-09-21 DIAGNOSIS — N2581 Secondary hyperparathyroidism of renal origin: Secondary | ICD-10-CM | POA: Diagnosis not present

## 2016-09-21 DIAGNOSIS — N186 End stage renal disease: Secondary | ICD-10-CM | POA: Diagnosis not present

## 2016-09-23 DIAGNOSIS — N186 End stage renal disease: Secondary | ICD-10-CM | POA: Diagnosis not present

## 2016-09-23 DIAGNOSIS — N2581 Secondary hyperparathyroidism of renal origin: Secondary | ICD-10-CM | POA: Diagnosis not present

## 2016-09-26 DIAGNOSIS — N2581 Secondary hyperparathyroidism of renal origin: Secondary | ICD-10-CM | POA: Diagnosis not present

## 2016-09-26 DIAGNOSIS — N186 End stage renal disease: Secondary | ICD-10-CM | POA: Diagnosis not present

## 2016-09-28 DIAGNOSIS — N2581 Secondary hyperparathyroidism of renal origin: Secondary | ICD-10-CM | POA: Diagnosis not present

## 2016-09-28 DIAGNOSIS — N186 End stage renal disease: Secondary | ICD-10-CM | POA: Diagnosis not present

## 2016-09-29 DIAGNOSIS — N186 End stage renal disease: Secondary | ICD-10-CM | POA: Diagnosis not present

## 2016-09-29 DIAGNOSIS — N2581 Secondary hyperparathyroidism of renal origin: Secondary | ICD-10-CM | POA: Diagnosis not present

## 2016-10-03 DIAGNOSIS — N186 End stage renal disease: Secondary | ICD-10-CM | POA: Diagnosis not present

## 2016-10-03 DIAGNOSIS — N2581 Secondary hyperparathyroidism of renal origin: Secondary | ICD-10-CM | POA: Diagnosis not present

## 2016-10-04 DIAGNOSIS — N186 End stage renal disease: Secondary | ICD-10-CM | POA: Diagnosis not present

## 2016-10-04 DIAGNOSIS — N2581 Secondary hyperparathyroidism of renal origin: Secondary | ICD-10-CM | POA: Diagnosis not present

## 2016-10-06 DIAGNOSIS — N186 End stage renal disease: Secondary | ICD-10-CM | POA: Diagnosis not present

## 2016-10-06 DIAGNOSIS — N2581 Secondary hyperparathyroidism of renal origin: Secondary | ICD-10-CM | POA: Diagnosis not present

## 2016-10-09 DIAGNOSIS — N186 End stage renal disease: Secondary | ICD-10-CM | POA: Diagnosis not present

## 2016-10-09 DIAGNOSIS — N2581 Secondary hyperparathyroidism of renal origin: Secondary | ICD-10-CM | POA: Diagnosis not present

## 2016-10-11 ENCOUNTER — Encounter: Payer: Self-pay | Admitting: Physician Assistant

## 2016-10-11 ENCOUNTER — Ambulatory Visit (INDEPENDENT_AMBULATORY_CARE_PROVIDER_SITE_OTHER): Payer: BLUE CROSS/BLUE SHIELD | Admitting: Physician Assistant

## 2016-10-11 ENCOUNTER — Encounter (INDEPENDENT_AMBULATORY_CARE_PROVIDER_SITE_OTHER): Payer: Self-pay

## 2016-10-11 VITALS — BP 162/60 | HR 100 | Ht 72.0 in | Wt 247.0 lb

## 2016-10-11 DIAGNOSIS — I428 Other cardiomyopathies: Secondary | ICD-10-CM

## 2016-10-11 DIAGNOSIS — Z992 Dependence on renal dialysis: Secondary | ICD-10-CM

## 2016-10-11 DIAGNOSIS — I2510 Atherosclerotic heart disease of native coronary artery without angina pectoris: Secondary | ICD-10-CM

## 2016-10-11 DIAGNOSIS — I110 Hypertensive heart disease with heart failure: Secondary | ICD-10-CM

## 2016-10-11 DIAGNOSIS — Z961 Presence of intraocular lens: Secondary | ICD-10-CM | POA: Diagnosis not present

## 2016-10-11 DIAGNOSIS — N186 End stage renal disease: Secondary | ICD-10-CM | POA: Diagnosis not present

## 2016-10-11 DIAGNOSIS — H35033 Hypertensive retinopathy, bilateral: Secondary | ICD-10-CM | POA: Diagnosis not present

## 2016-10-11 DIAGNOSIS — N2581 Secondary hyperparathyroidism of renal origin: Secondary | ICD-10-CM | POA: Diagnosis not present

## 2016-10-11 DIAGNOSIS — I272 Pulmonary hypertension, unspecified: Secondary | ICD-10-CM | POA: Diagnosis not present

## 2016-10-11 DIAGNOSIS — I5022 Chronic systolic (congestive) heart failure: Secondary | ICD-10-CM

## 2016-10-11 DIAGNOSIS — E113511 Type 2 diabetes mellitus with proliferative diabetic retinopathy with macular edema, right eye: Secondary | ICD-10-CM | POA: Diagnosis not present

## 2016-10-11 DIAGNOSIS — E113512 Type 2 diabetes mellitus with proliferative diabetic retinopathy with macular edema, left eye: Secondary | ICD-10-CM | POA: Diagnosis not present

## 2016-10-11 MED ORDER — ISOSORBIDE MONONITRATE ER 30 MG PO TB24: 15.0000 mg | ORAL_TABLET | Freq: Every day | ORAL | 3 refills | Status: DC

## 2016-10-11 NOTE — Patient Instructions (Signed)
Medication Instructions:  1. START IMDUR 30 MG WITH THE DIRECTIONS TO TAKE 1/2 TABLET DAILY = 15 MG DAILY; RX SENT  Labwork: NONE ORDERED   Testing/Procedures: NONE ORDERED   Follow-Up: Richardson Dopp, [facility] 11/14/16 @ 3:45  Any Other Special Instructions Will Be Listed Below (If Applicable).     If you need a refill on your cardiac medications before your next appointment, please call your pharmacy.

## 2016-10-11 NOTE — Progress Notes (Signed)
Cardiology Office Note:    Date:  10/11/2016   ID:  Austin Kramer, DOB [birth year], MRN [id number]  PCP:  Denita Lung, MD  Cardiologist:  Dr. Ena Dawley    Referring MD: Denita Lung, MD   Chief Complaint  Patient presents with  . Congestive Heart Failure    follow up    History of Present Illness:    Austin Kramer is a 56 y.o. male with a hx of DM2 (since his 17s), HTN, HL, ESRD (placed on dialysis 07/2016), prostate CA s/p prostatectomy, GERD, peripheral neuropathy, pulmonary HTN, cardiomyopathy, anemia.  He was initially seen in 11/17 for chest pain and shortness of breath.  Echo in 12/17 demonstrated moderate LVH, EF 40-45%, diffuse HK, diastolic flattening and systolic flattening, RVH, mild LAE/RAE, mild MR, PASP 94 mmHg, elevated CVP.  Myoview in 12/17 demonstrated EF 42%, no ischemia.  CTA was recommended to r/o pulmonary embolism but was never done.  The patient was not yet on dialysis but was ultimately started on dialysis in 6/18 (Tues, Thurs, Sat).  R/L heart cath was done 09/01/16 and demonstrated mild nonobstructive CAD, severe LV dysfunction with EF 25-30, moderately elevated LVEDP and mod pulmonary HTN.  Recommendation was for intensification of CHF therapy as well as additional fluid removal at dialysis.  He was admitted overnight and underwent dialysis 2 with improvement in his volume status. Hydralazine and nitrates were added to his medical regimen.  Last seen by me 09/12/16.    Austin Kramer returns for follow up.  He is here alone.  He is now on MWF dialysis.  He has not taken his meds today b/c his BP drops quite low on dialysis.  He denies chest pain, shortness of breath, syncope, orthopnea, PND or significant pedal edema.   Prior CV studies:   The following studies were reviewed today:  R/L heart cath 09/01/16 LAD normal LCx proximal 25 RCA proximal 25 EF 25-35 Mean RA 8, PASP 60, mean PA 43, LVEDP 29  Myoview 12/17 EF 40, no ischemia,  intermediate risk 2/2 reduced EF  Echo 12/17 Mod LVH, EF 40-45, diffuse HK, mild MR, mild LAE, RVH, nl RVSF, mild RAE, trivial TR, PASP 94  Past Medical History:  Diagnosis Date  . Anemia   . CAD (coronary artery disease) 09/12/2016   R/L heart cath 09/01/16:  nl LAD, pLCx 25, pRCA 25, EF 25-35; Mean RA 8, PASP 60, mean PA 43, LVEDP 29  . Chronic systolic CHF (congestive heart failure) ([place])    Echo 12/17: EF 40-45 // L HC 7/18: EF 25-35, LVEDP 29  . Diabetes mellitus   . ED (erectile dysfunction)   . ESRD on hemodialysis ([place])    a. started HD 07/2016  . GERD (gastroesophageal reflux disease)    occasional  . Hyperlipidemia   . Hypertension    [name]  . NICM (nonischemic cardiomyopathy) ([place])    a. Echo 12/17-mod LVH, EF 40-45, diff HK, diast+syst flattening, RVH, mild LAE/RAE, mild MR, PASP 42mmHg //  Nuclear stress test 01/2016: EF 42, no ischemia. // R/L Ht Cath 7/18: min cor plaque; elev LVEDP, elev PASP >>   . Obesity   . Peripheral neuropathy    neuropathy, "tingling in feet"  . PPD positive, treated   . Prostate cancer ([place])   . Pulmonary hypertension ([place])    severe PAH by 01/2016 echo    Past Surgical History:  Procedure Laterality Date  . AMPUTATION  01/26/2012  Procedure: AMPUTATION RAY;  Surgeon: Newt Minion, MD;  Location: [facility];  Service: Orthopedics;  Laterality: Right;  Right foot 2nd ray amputation  . AV FISTULA PLACEMENT Left 10/09/2014   Procedure: CREATION OF LEFT RADIOCEPHALIC ARTERIOVENOUS (AV) FISTULA ;  Surgeon: Serafina Mitchell, MD;  Location: [facility];  Service: Vascular;  Laterality: Left;  . COLONOSCOPY    . FRACTURE SURGERY     shoulder surgery  . KNEE CARTILAGE SURGERY    . PARS PLANA VITRECTOMY Left 12/17/2012   Procedure: LEFT PARS PLANA VITRECTOMY WITH 25 GAUGE/ENDO LASER;  Surgeon: Hurman Horn, MD;  Location: [facility];  Service: Ophthalmology;  Laterality: Left;  . PARS PLANA VITRECTOMY Right 02/05/2016   Procedure: PARS PLANA  VITRECTOMY WITH 25 GAUGE;  Surgeon: Jalene Mullet, MD;  Location: [facility];  Service: Ophthalmology;  Laterality: Right;  with block  . PHOTOCOAGULATION WITH LASER Left 12/17/2012   Procedure: PHOTOCOAGULATION WITH LASER;  Surgeon: Hurman Horn, MD;  Location: [facility];  Service: Ophthalmology;  Laterality: Left;  . PROSTATECTOMY  2011  . RIGHT/LEFT HEART CATH AND CORONARY ANGIOGRAPHY N/A 09/01/2016   Procedure: Right/Left Heart Cath and Coronary Angiography;  Surgeon: Martinique, Peter M, MD;  Location: [facility];  Service: Cardiovascular;  Laterality: N/A;  . VASECTOMY      Current Medications: Current Meds  Medication Sig  . acetaminophen (TYLENOL) 500 MG tablet Take 500 mg by mouth daily as needed for moderate pain or headache.  Marland Kitchen aspirin 81 MG tablet Take 81 mg by mouth daily.  . calcitRIOL (ROCALTROL) 0.25 MCG capsule Take 0.25 mcg by mouth daily.  . calcium acetate (PHOSLO) 667 MG capsule Take 2 capsules (1,334 mg total) by mouth 3 (three) times daily with meals.  . carvedilol (COREG) 12.5 MG tablet Take 1 tablet (12.5 mg total) by mouth 2 (two) times daily.  . Cholecalciferol (VITAMIN D PO) Take 1 tablet by mouth daily.  . fish oil-omega-3 fatty acids 1000 MG capsule Take 1 g by mouth daily.  . furosemide (LASIX) 80 MG tablet Take 80 mg by mouth 2 (two) times daily.  . hydrALAZINE (APRESOLINE) 25 MG tablet Take 1 tablet (25 mg total) by mouth 3 (three) times daily.  . insulin detemir (LEVEMIR) 100 UNIT/ML injection Inject 0.1 mLs (10 Units total) into the skin 2 (two) times daily.  . insulin lispro (HUMALOG) 100 UNIT/ML injection Inject 7 Units into the skin 3 (three) times daily before meals.  . Magnesium 250 MG TABS Take 250 mg by mouth daily.   . Misc Natural Products (TART CHERRY ADVANCED PO) Take 1 tablet by mouth daily.   . Multiple Minerals (CALCIUM/MAGNESIUM/ZINC PO) Take 1 tablet by mouth daily.   . Multiple Vitamin (MULTIVITAMIN WITH MINERALS) TABS Take 1 tablet by mouth  daily.  . rosuvastatin (CRESTOR) 20 MG tablet Take 1 tablet (20 mg total) by mouth daily.  . traMADol (ULTRAM) 50 MG tablet Take 50 mg by mouth every 6 (six) hours as needed (pain).  . vitamin B-12 (CYANOCOBALAMIN) 1000 MCG tablet Take 1,000 mcg by mouth daily.  Marland Kitchen VITAMIN E PO Take 1 capsule by mouth daily.     Allergies:   Patient has no known allergies.   Social History   Social History  . Marital status: Married    Spouse name: N/A  . Number of children: 4  . Years of education: N/A   Occupational History  . warehouse    Social History Main Topics  .  Smoking status: Never Smoker  . Smokeless tobacco: Never Used  . Alcohol use No  . Drug use: No  . Sexual activity: Yes   Other Topics Concern  . None   Social History Narrative  . None     Family Hx: The patient's family history includes Diabetes in his father and mother.  ROS:   Please see the history of present illness.    ROS All other systems reviewed and are negative.   EKGs/Labs/Other Test Reviewed:    EKG:  EKG is not ordered today.  The ekg ordered today demonstrates n/a  Recent Labs: 11/27/2015: B Natriuretic Peptide 439.9 02/15/2016: Magnesium 2.2 07/26/2016: ALT 351 09/02/2016: BUN 18; Creatinine, Ser 4.06; Hemoglobin 10.0; Platelets 222; Potassium 3.8; Sodium 133   Recent Lipid Panel Lab Results  Component Value Date/Time   CHOL 171 11/25/2015 04:16 PM   TRIG 107 11/25/2015 04:16 PM   HDL 37 (L) 11/25/2015 04:16 PM   CHOLHDL 4.6 11/25/2015 04:16 PM   LDLCALC 113 11/25/2015 04:16 PM    Physical Exam:    VS:  BP (!) 162/60   Pulse 100   Ht 6' (1.829 m)   Wt 247 lb (112 kg)   BMI 33.50 kg/m     Wt Readings from Last 3 Encounters:  10/11/16 247 lb (112 kg)  09/12/16 248 lb (112.5 kg)  09/02/16 235 lb 8 oz (106.8 kg)     Physical Exam  Constitutional: He is oriented to person, place, and time. He appears well-developed and well-nourished. No distress.  HENT:  Head: Normocephalic and  atraumatic.  Eyes: No scleral icterus.  Neck: No JVD present.  Cardiovascular: Normal rate and regular rhythm.   No murmur heard. Pulmonary/Chest: Effort normal. He has no rales.  Abdominal: Soft. There is no tenderness.  Musculoskeletal: He exhibits edema (tight trace - 1+ bilat LE edema).  Neurological: He is alert and oriented to person, place, and time.  Skin: Skin is warm and dry.  Psychiatric: He has a normal mood and affect.    ASSESSMENT:    1. Chronic systolic CHF (congestive heart failure) ([place])   2. NICM (nonischemic cardiomyopathy) ([place])   3. Coronary artery disease involving native coronary artery of native heart without angina pectoris   4. Hypertensive heart disease with chronic systolic congestive heart failure ([place])   5. ESRD on dialysis ([place])   6. Pulmonary hypertension, unspecified ([place])    PLAN:    In order of problems listed above:  1. Chronic systolic CHF (congestive heart failure) ([facility]) NYHA 2.  NICM.  His volume is managed by dialysis.  His volume is stable.  Continue beta-blocker, hydralazine. I will add Isosorbide 15 mg QD today.    2. NICM (nonischemic cardiomyopathy) ([facility]) EF 25-35 at Cardiac Catheterization.  Continue to titrate HF meds as BP will allow.  Once he is on max guideline directed therapy, arrange repeat echocardiogram to recheck ejection fraction.    3. Coronary artery disease involving native coronary artery of native heart without angina pectoris No angina.  Continue ASA, statin.  4. Hypertensive heart disease with chronic systolic congestive heart failure ([facility]) BP is elevated today.  He has not taken any meds as he is leaving here to go to dialysis.  Prior BPs have been optimal.  Continue to monitor.  5. ESRD on dialysis [facility]) Now on MWF dialysis.  6. Pulmonary hypertension, unspecified ([place]) Secondary to congestive heart failure.     Dispo:  Return in about 1 month (  around 11/11/2016) for Routine Follow Up, w/ Richardson Dopp,  PA-C.   Medication Adjustments/Labs and Tests Ordered: Current medicines are reviewed at length with the patient today.  Concerns regarding medicines are outlined above.  Tests Ordered: No orders of the defined types were placed in this encounter.  Medication Changes: Meds ordered this encounter  Medications  . isosorbide mononitrate (IMDUR) 30 MG 24 hr tablet    Sig: Take 0.5 tablets (15 mg total) by mouth daily.    Dispense:  90 tablet    Refill:  3    Signed, Richardson Dopp, PA-C  10/11/2016 4:01 PM    [facility] [place], [place], [place]  [address] Phone: [phone]; Fax: [phone]

## 2016-10-13 DIAGNOSIS — E1129 Type 2 diabetes mellitus with other diabetic kidney complication: Secondary | ICD-10-CM | POA: Diagnosis not present

## 2016-10-13 DIAGNOSIS — Z992 Dependence on renal dialysis: Secondary | ICD-10-CM | POA: Diagnosis not present

## 2016-10-13 DIAGNOSIS — N186 End stage renal disease: Secondary | ICD-10-CM | POA: Diagnosis not present

## 2016-10-13 DIAGNOSIS — N2581 Secondary hyperparathyroidism of renal origin: Secondary | ICD-10-CM | POA: Diagnosis not present

## 2016-10-16 DIAGNOSIS — N2581 Secondary hyperparathyroidism of renal origin: Secondary | ICD-10-CM | POA: Diagnosis not present

## 2016-10-16 DIAGNOSIS — N186 End stage renal disease: Secondary | ICD-10-CM | POA: Diagnosis not present

## 2016-10-18 DIAGNOSIS — N186 End stage renal disease: Secondary | ICD-10-CM | POA: Diagnosis not present

## 2016-10-18 DIAGNOSIS — N2581 Secondary hyperparathyroidism of renal origin: Secondary | ICD-10-CM | POA: Diagnosis not present

## 2016-10-20 DIAGNOSIS — N2581 Secondary hyperparathyroidism of renal origin: Secondary | ICD-10-CM | POA: Diagnosis not present

## 2016-10-20 DIAGNOSIS — N186 End stage renal disease: Secondary | ICD-10-CM | POA: Diagnosis not present

## 2016-10-23 DIAGNOSIS — N2581 Secondary hyperparathyroidism of renal origin: Secondary | ICD-10-CM | POA: Diagnosis not present

## 2016-10-23 DIAGNOSIS — N186 End stage renal disease: Secondary | ICD-10-CM | POA: Diagnosis not present

## 2016-10-25 ENCOUNTER — Other Ambulatory Visit: Payer: Self-pay | Admitting: Family Medicine

## 2016-10-25 ENCOUNTER — Other Ambulatory Visit: Payer: Self-pay | Admitting: Physician Assistant

## 2016-10-25 DIAGNOSIS — N2581 Secondary hyperparathyroidism of renal origin: Secondary | ICD-10-CM | POA: Diagnosis not present

## 2016-10-25 DIAGNOSIS — N186 End stage renal disease: Secondary | ICD-10-CM | POA: Diagnosis not present

## 2016-10-27 DIAGNOSIS — N186 End stage renal disease: Secondary | ICD-10-CM | POA: Diagnosis not present

## 2016-10-27 DIAGNOSIS — N2581 Secondary hyperparathyroidism of renal origin: Secondary | ICD-10-CM | POA: Diagnosis not present

## 2016-10-30 DIAGNOSIS — N2581 Secondary hyperparathyroidism of renal origin: Secondary | ICD-10-CM | POA: Diagnosis not present

## 2016-10-30 DIAGNOSIS — N186 End stage renal disease: Secondary | ICD-10-CM | POA: Diagnosis not present

## 2016-11-01 DIAGNOSIS — N186 End stage renal disease: Secondary | ICD-10-CM | POA: Diagnosis not present

## 2016-11-01 DIAGNOSIS — N2581 Secondary hyperparathyroidism of renal origin: Secondary | ICD-10-CM | POA: Diagnosis not present

## 2016-11-03 ENCOUNTER — Ambulatory Visit: Payer: BLUE CROSS/BLUE SHIELD | Admitting: Cardiology

## 2016-11-03 DIAGNOSIS — N186 End stage renal disease: Secondary | ICD-10-CM | POA: Diagnosis not present

## 2016-11-03 DIAGNOSIS — N2581 Secondary hyperparathyroidism of renal origin: Secondary | ICD-10-CM | POA: Diagnosis not present

## 2016-11-06 DIAGNOSIS — N2581 Secondary hyperparathyroidism of renal origin: Secondary | ICD-10-CM | POA: Diagnosis not present

## 2016-11-06 DIAGNOSIS — N186 End stage renal disease: Secondary | ICD-10-CM | POA: Diagnosis not present

## 2016-11-08 DIAGNOSIS — N186 End stage renal disease: Secondary | ICD-10-CM | POA: Diagnosis not present

## 2016-11-08 DIAGNOSIS — N2581 Secondary hyperparathyroidism of renal origin: Secondary | ICD-10-CM | POA: Diagnosis not present

## 2016-11-10 DIAGNOSIS — N186 End stage renal disease: Secondary | ICD-10-CM | POA: Diagnosis not present

## 2016-11-10 DIAGNOSIS — N2581 Secondary hyperparathyroidism of renal origin: Secondary | ICD-10-CM | POA: Diagnosis not present

## 2016-11-12 DIAGNOSIS — N186 End stage renal disease: Secondary | ICD-10-CM | POA: Diagnosis not present

## 2016-11-12 DIAGNOSIS — E1129 Type 2 diabetes mellitus with other diabetic kidney complication: Secondary | ICD-10-CM | POA: Diagnosis not present

## 2016-11-12 DIAGNOSIS — Z992 Dependence on renal dialysis: Secondary | ICD-10-CM | POA: Diagnosis not present

## 2016-11-13 DIAGNOSIS — N186 End stage renal disease: Secondary | ICD-10-CM | POA: Diagnosis not present

## 2016-11-13 DIAGNOSIS — N2581 Secondary hyperparathyroidism of renal origin: Secondary | ICD-10-CM | POA: Diagnosis not present

## 2016-11-15 DIAGNOSIS — N186 End stage renal disease: Secondary | ICD-10-CM | POA: Diagnosis not present

## 2016-11-15 DIAGNOSIS — N2581 Secondary hyperparathyroidism of renal origin: Secondary | ICD-10-CM | POA: Diagnosis not present

## 2016-11-17 DIAGNOSIS — N186 End stage renal disease: Secondary | ICD-10-CM | POA: Diagnosis not present

## 2016-11-17 DIAGNOSIS — N2581 Secondary hyperparathyroidism of renal origin: Secondary | ICD-10-CM | POA: Diagnosis not present

## 2016-11-20 DIAGNOSIS — N186 End stage renal disease: Secondary | ICD-10-CM | POA: Diagnosis not present

## 2016-11-20 DIAGNOSIS — N2581 Secondary hyperparathyroidism of renal origin: Secondary | ICD-10-CM | POA: Diagnosis not present

## 2016-11-22 DIAGNOSIS — N2581 Secondary hyperparathyroidism of renal origin: Secondary | ICD-10-CM | POA: Diagnosis not present

## 2016-11-22 DIAGNOSIS — N186 End stage renal disease: Secondary | ICD-10-CM | POA: Diagnosis not present

## 2016-11-24 DIAGNOSIS — N2581 Secondary hyperparathyroidism of renal origin: Secondary | ICD-10-CM | POA: Diagnosis not present

## 2016-11-24 DIAGNOSIS — N186 End stage renal disease: Secondary | ICD-10-CM | POA: Diagnosis not present

## 2016-11-27 DIAGNOSIS — Z23 Encounter for immunization: Secondary | ICD-10-CM | POA: Diagnosis not present

## 2016-11-27 DIAGNOSIS — N2581 Secondary hyperparathyroidism of renal origin: Secondary | ICD-10-CM | POA: Diagnosis not present

## 2016-11-27 DIAGNOSIS — N186 End stage renal disease: Secondary | ICD-10-CM | POA: Diagnosis not present

## 2016-11-29 ENCOUNTER — Ambulatory Visit: Payer: BLUE CROSS/BLUE SHIELD | Admitting: Cardiology

## 2016-11-29 DIAGNOSIS — Z23 Encounter for immunization: Secondary | ICD-10-CM | POA: Diagnosis not present

## 2016-11-29 DIAGNOSIS — N186 End stage renal disease: Secondary | ICD-10-CM | POA: Diagnosis not present

## 2016-11-29 DIAGNOSIS — N2581 Secondary hyperparathyroidism of renal origin: Secondary | ICD-10-CM | POA: Diagnosis not present

## 2016-12-01 DIAGNOSIS — N2581 Secondary hyperparathyroidism of renal origin: Secondary | ICD-10-CM | POA: Diagnosis not present

## 2016-12-01 DIAGNOSIS — Z23 Encounter for immunization: Secondary | ICD-10-CM | POA: Diagnosis not present

## 2016-12-01 DIAGNOSIS — N186 End stage renal disease: Secondary | ICD-10-CM | POA: Diagnosis not present

## 2016-12-04 DIAGNOSIS — N2581 Secondary hyperparathyroidism of renal origin: Secondary | ICD-10-CM | POA: Diagnosis not present

## 2016-12-04 DIAGNOSIS — N186 End stage renal disease: Secondary | ICD-10-CM | POA: Diagnosis not present

## 2016-12-06 DIAGNOSIS — N2581 Secondary hyperparathyroidism of renal origin: Secondary | ICD-10-CM | POA: Diagnosis not present

## 2016-12-06 DIAGNOSIS — N186 End stage renal disease: Secondary | ICD-10-CM | POA: Diagnosis not present

## 2016-12-08 DIAGNOSIS — N2581 Secondary hyperparathyroidism of renal origin: Secondary | ICD-10-CM | POA: Diagnosis not present

## 2016-12-08 DIAGNOSIS — N186 End stage renal disease: Secondary | ICD-10-CM | POA: Diagnosis not present

## 2016-12-11 DIAGNOSIS — N2581 Secondary hyperparathyroidism of renal origin: Secondary | ICD-10-CM | POA: Diagnosis not present

## 2016-12-11 DIAGNOSIS — N186 End stage renal disease: Secondary | ICD-10-CM | POA: Diagnosis not present

## 2016-12-13 DIAGNOSIS — Z992 Dependence on renal dialysis: Secondary | ICD-10-CM | POA: Diagnosis not present

## 2016-12-13 DIAGNOSIS — E1129 Type 2 diabetes mellitus with other diabetic kidney complication: Secondary | ICD-10-CM | POA: Diagnosis not present

## 2016-12-13 DIAGNOSIS — N186 End stage renal disease: Secondary | ICD-10-CM | POA: Diagnosis not present

## 2016-12-15 DIAGNOSIS — N186 End stage renal disease: Secondary | ICD-10-CM | POA: Diagnosis not present

## 2016-12-15 DIAGNOSIS — N2581 Secondary hyperparathyroidism of renal origin: Secondary | ICD-10-CM | POA: Diagnosis not present

## 2016-12-18 DIAGNOSIS — N2581 Secondary hyperparathyroidism of renal origin: Secondary | ICD-10-CM | POA: Diagnosis not present

## 2016-12-18 DIAGNOSIS — N186 End stage renal disease: Secondary | ICD-10-CM | POA: Diagnosis not present

## 2016-12-18 DIAGNOSIS — R51 Headache: Secondary | ICD-10-CM | POA: Diagnosis not present

## 2016-12-19 ENCOUNTER — Other Ambulatory Visit: Payer: Self-pay | Admitting: Physician Assistant

## 2016-12-19 ENCOUNTER — Other Ambulatory Visit: Payer: Self-pay | Admitting: Family Medicine

## 2016-12-19 DIAGNOSIS — M109 Gout, unspecified: Secondary | ICD-10-CM

## 2016-12-20 DIAGNOSIS — R51 Headache: Secondary | ICD-10-CM | POA: Diagnosis not present

## 2016-12-20 DIAGNOSIS — N186 End stage renal disease: Secondary | ICD-10-CM | POA: Diagnosis not present

## 2016-12-20 DIAGNOSIS — N2581 Secondary hyperparathyroidism of renal origin: Secondary | ICD-10-CM | POA: Diagnosis not present

## 2016-12-21 DIAGNOSIS — E113512 Type 2 diabetes mellitus with proliferative diabetic retinopathy with macular edema, left eye: Secondary | ICD-10-CM | POA: Diagnosis not present

## 2016-12-22 DIAGNOSIS — N186 End stage renal disease: Secondary | ICD-10-CM | POA: Diagnosis not present

## 2016-12-22 DIAGNOSIS — N2581 Secondary hyperparathyroidism of renal origin: Secondary | ICD-10-CM | POA: Diagnosis not present

## 2016-12-22 DIAGNOSIS — R51 Headache: Secondary | ICD-10-CM | POA: Diagnosis not present

## 2016-12-25 DIAGNOSIS — N186 End stage renal disease: Secondary | ICD-10-CM | POA: Diagnosis not present

## 2016-12-25 DIAGNOSIS — N2581 Secondary hyperparathyroidism of renal origin: Secondary | ICD-10-CM | POA: Diagnosis not present

## 2016-12-27 DIAGNOSIS — N186 End stage renal disease: Secondary | ICD-10-CM | POA: Diagnosis not present

## 2016-12-27 DIAGNOSIS — N2581 Secondary hyperparathyroidism of renal origin: Secondary | ICD-10-CM | POA: Diagnosis not present

## 2016-12-28 ENCOUNTER — Encounter (INDEPENDENT_AMBULATORY_CARE_PROVIDER_SITE_OTHER): Payer: Self-pay

## 2016-12-28 ENCOUNTER — Ambulatory Visit: Payer: BLUE CROSS/BLUE SHIELD | Admitting: Physician Assistant

## 2016-12-28 ENCOUNTER — Encounter: Payer: Self-pay | Admitting: Physician Assistant

## 2016-12-28 VITALS — BP 104/60 | HR 99 | Resp 16 | Ht 72.0 in | Wt 245.1 lb

## 2016-12-28 DIAGNOSIS — I10 Essential (primary) hypertension: Secondary | ICD-10-CM

## 2016-12-28 DIAGNOSIS — I953 Hypotension of hemodialysis: Secondary | ICD-10-CM

## 2016-12-28 DIAGNOSIS — I5022 Chronic systolic (congestive) heart failure: Secondary | ICD-10-CM | POA: Diagnosis not present

## 2016-12-28 DIAGNOSIS — I2510 Atherosclerotic heart disease of native coronary artery without angina pectoris: Secondary | ICD-10-CM

## 2016-12-28 DIAGNOSIS — I272 Pulmonary hypertension, unspecified: Secondary | ICD-10-CM

## 2016-12-28 MED ORDER — ISOSORBIDE MONONITRATE ER 30 MG PO TB24: 15.0000 mg | ORAL_TABLET | Freq: Every day | ORAL | 3 refills | Status: DC

## 2016-12-28 MED ORDER — CARVEDILOL 6.25 MG PO TABS: 6.2500 mg | ORAL_TABLET | Freq: Two times a day (BID) | ORAL | 1 refills | Status: DC

## 2016-12-28 NOTE — Progress Notes (Signed)
Cardiology Office Note    Date:  12/28/2016  ID:  SAMBA CUMBA, DOB 16-Sep-1960, MRN [id number] PCP:  Denita Lung, MD  Cardiologist:  Dr. Meda Coffee   Chief Complaint: f/u cardiomyopathy  History of Present Illness:  Austin Kramer is a 56 y.o. male with history of chronic systolic CHF/non-ischemic cardiomyopathy (mild nonobstructive CAD by cath 08/2016), DM type 2 (since his 67s), HTN, HLD, ESRD (placed on dialysis 07/2016), prostate CA s/p prostatectomy, GERD, peripheral neuropathy, pulmonary HTN, anemia who presents for f/u.  He established care in 12/2015 for DOE, edema, and occasional chest discomfort. 2D Echo 02/02/16 showed moderate LVH, EF 40-45%, diffuse HK, diastolic flattening and systolic flattening, RVH, mild LAE/RAE, mild MR, PASP 63mmHg, elevated CVP. Nuclear stress test 01/2016: EF 42%, no ischemia. Due to his PASP Dr. Meda Coffee recommended further evaluation of PE. CTA was ordered but never performed - a message was left but it does not appear he returned the call. Creatinine at that time was 7-8 without dialysis. He managed by renal in the interim, and went on HD 07/2016. Right and left heart cath 09/01/16 showed 25% prox Cx, 25% prox RCA, EF 25-35%, moderate pulm HTN, mod elevated LVEDP. Recommendation was for intensification of CHF therapy as well as additional fluid removal at dialysis. He's had issues with blood pressure dropping at dialysis. He was last seen 10/11/16 by Richardson Dopp PA-C and felt to be doing well. Imdur 15mg  daily was added with plan to recheck echo once on max guideline directed therapy. Last labs 08/2016 showed Hgb 10.0, K 3.8, Cr 4.06. Lipids are followed by primary care.  He returns for follow-up today alone. He is doing great per his report. He feels so much better on dialysis. His edema has resolved. He was originally going to throw away all his shoes because they couldn't fit when he was so edematous but now he can wear them all. No CP, SOB, diaphoresis,  dizziness, syncope. He does report continued issue with BP dropping at HD. As a result he's only taking Coreg once a day (daily) and is only taking one hydralazine twice a week (Tues/Thurs). He also states he was taken off Lasix by nephrology. He feels better since starting the Imdur. Last time BP dropped was last week down to the 80s.   Past Medical History:  Diagnosis Date  . Anemia   . CAD (coronary artery disease) 09/12/2016   R/L heart cath 09/01/16:  nl LAD, pLCx 25, pRCA 25, EF 25-35; Mean RA 8, PASP 60, mean PA 43, LVEDP 29  . Chronic systolic CHF (congestive heart failure) ([place])    Echo 12/17: EF 40-45 // L HC 7/18: EF 25-35, LVEDP 29  . Diabetes mellitus   . ED (erectile dysfunction)   . ESRD on hemodialysis ([place])    a. started HD 07/2016  . GERD (gastroesophageal reflux disease)    occasional  . Hyperlipidemia   . Hypertension    [name]  . NICM (nonischemic cardiomyopathy) ([place])    a. Echo 12/17-mod LVH, EF 40-45, diff HK, diast+syst flattening, RVH, mild LAE/RAE, mild MR, PASP 73mmHg //  Nuclear stress test 01/2016: EF 42, no ischemia. // R/L Ht Cath 7/18: min cor plaque; elev LVEDP, elev PASP >>   . Obesity   . Peripheral neuropathy    neuropathy, "tingling in feet"  . PPD positive, treated   . Prostate cancer ([place])   . Pulmonary hypertension (HCC)    severe PAH by  01/2016 echo  . Pulmonary hypertension ([place])    a. PASP 94 by echo 01/2016. b. After initiation of dialysis, cath 08/2016 showed moderate pulm HTN.    Past Surgical History:  Procedure Laterality Date  . AMPUTATION  01/26/2012   Procedure: AMPUTATION RAY;  Surgeon: Newt Minion, MD;  Location: [facility];  Service: Orthopedics;  Laterality: Right;  Right foot 2nd ray amputation  . AV FISTULA PLACEMENT Left 10/09/2014   Procedure: CREATION OF LEFT RADIOCEPHALIC ARTERIOVENOUS (AV) FISTULA ;  Surgeon: Serafina Mitchell, MD;  Location: [facility];  Service: Vascular;  Laterality: Left;  . COLONOSCOPY    .  FRACTURE SURGERY     shoulder surgery  . KNEE CARTILAGE SURGERY    . PARS PLANA VITRECTOMY Left 12/17/2012   Procedure: LEFT PARS PLANA VITRECTOMY WITH 25 GAUGE/ENDO LASER;  Surgeon: Hurman Horn, MD;  Location: [facility];  Service: Ophthalmology;  Laterality: Left;  . PARS PLANA VITRECTOMY Right 02/05/2016   Procedure: PARS PLANA VITRECTOMY WITH 25 GAUGE;  Surgeon: Jalene Mullet, MD;  Location: [facility];  Service: Ophthalmology;  Laterality: Right;  with block  . PHOTOCOAGULATION WITH LASER Left 12/17/2012   Procedure: PHOTOCOAGULATION WITH LASER;  Surgeon: Hurman Horn, MD;  Location: [facility];  Service: Ophthalmology;  Laterality: Left;  . PROSTATECTOMY  2011  . RIGHT/LEFT HEART CATH AND CORONARY ANGIOGRAPHY N/A 09/01/2016   Procedure: Right/Left Heart Cath and Coronary Angiography;  Surgeon: Martinique, Peter M, MD;  Location: [facility];  Service: Cardiovascular;  Laterality: N/A;  . VASECTOMY      Current Medications: Current Meds  Medication Sig  . acetaminophen (TYLENOL) 500 MG tablet Take 500 mg by mouth daily as needed for moderate pain or headache.  Marland Kitchen aspirin 81 MG tablet Take 81 mg by mouth daily.  . calcitRIOL (ROCALTROL) 0.25 MCG capsule Take 0.25 mcg by mouth daily.  . calcium acetate (PHOSLO) 667 MG capsule Take 2 capsules (1,334 mg total) by mouth 3 (three) times daily with meals.  . carvedilol (COREG) 12.5 MG tablet Take 1 tablet (12.5 mg total) by mouth 2 (two) times daily.  . Cholecalciferol (VITAMIN D PO) Take 1 tablet by mouth daily.  . fish oil-omega-3 fatty acids 1000 MG capsule Take 1 g by mouth daily.  . furosemide (LASIX) 80 MG tablet Take 80 mg by mouth 2 (two) times daily.  . hydrALAZINE (APRESOLINE) 25 MG tablet Take 1 tablet (25 mg total) by mouth 3 (three) times daily.  . insulin detemir (LEVEMIR) 100 UNIT/ML injection Inject 0.1 mLs (10 Units total) into the skin 2 (two) times daily.  . insulin lispro (HUMALOG) 100 UNIT/ML injection Inject 7 Units into the skin  3 (three) times daily before meals.  . isosorbide mononitrate (IMDUR) 30 MG 24 hr tablet Take 0.5 tablets (15 mg total) by mouth daily.  . Magnesium 250 MG TABS Take 250 mg by mouth daily.   . Misc Natural Products (TART CHERRY ADVANCED PO) Take 1 tablet by mouth daily.   . Multiple Minerals (CALCIUM/MAGNESIUM/ZINC PO) Take 1 tablet by mouth daily.   . Multiple Vitamin (MULTIVITAMIN WITH MINERALS) TABS Take 1 tablet by mouth daily.  . rosuvastatin (CRESTOR) 20 MG tablet Take 1 tablet (20 mg total) by mouth daily.  . traMADol (ULTRAM) 50 MG tablet Take 50 mg by mouth every 6 (six) hours as needed (pain).  . vitamin B-12 (CYANOCOBALAMIN) 1000 MCG tablet Take 1,000 mcg by mouth daily.  Marland Kitchen VITAMIN E PO Take  1 capsule by mouth daily.     Allergies:   Patient has no known allergies.   Social History   Socioeconomic History  . Marital status: Married    Spouse name: None  . Number of children: 4  . Years of education: None  . Highest education level: None  Social Needs  . Financial resource strain: None  . Food insecurity - worry: None  . Food insecurity - inability: None  . Transportation needs - medical: None  . Transportation needs - non-medical: None  Occupational History  . Occupation: Proofreader  Tobacco Use  . Smoking status: Never Smoker  . Smokeless tobacco: Never Used  Substance and Sexual Activity  . Alcohol use: No  . Drug use: No  . Sexual activity: Yes  Other Topics Concern  . None  Social History Narrative  . None     Family History:  Family History  Problem Relation Age of Onset  . Diabetes Mother   . Diabetes Father      ROS:   Please see the history of present illness.  All other systems are reviewed and otherwise negative.    PHYSICAL EXAM:   VS:  BP 104/60   Pulse 99   Resp 16   Ht 6' (1.829 m)   Wt 245 lb 1.9 oz (111.2 kg)   SpO2 100%   BMI 33.24 kg/m   BMI: Body mass index is 33.24 kg/m. GEN: Well nourished, well developed obese AAM, in no  acute distress  HEENT: normocephalic, atraumatic Neck: no JVD, carotid bruits, or masses Cardiac: RRR; no murmurs, rubs, or gallops, no edema  Respiratory:  clear to auscultation bilaterally, normal work of breathing GI: soft, nontender, nondistended, + BS MS: no deformity or atrophy  Skin: warm and dry, no rash Neuro:  Alert and Oriented x 3, Strength and sensation are intact, follows commands Psych: euthymic mood, full affect  Wt Readings from Last 3 Encounters:  12/28/16 245 lb 1.9 oz (111.2 kg)  10/11/16 247 lb (112 kg)  09/12/16 248 lb (112.5 kg)      Studies/Labs Reviewed:    EKG:   EKG was not ordered today  Recent Labs: 02/15/2016: Magnesium 2.2 07/26/2016: ALT 351 09/02/2016: BUN 18; Creatinine, Ser 4.06; Hemoglobin 10.0; Platelets 222; Potassium 3.8; Sodium 133   Lipid Panel    Component Value Date/Time   CHOL 171 11/25/2015 1616   TRIG 107 11/25/2015 1616   HDL 37 (L) 11/25/2015 1616   CHOLHDL 4.6 11/25/2015 1616   VLDL 21 11/25/2015 1616   LDLCALC 113 11/25/2015 1616    Additional studies/ records that were reviewed today include: Summarized above.   ASSESSMENT & PLAN:   1. Chronic systolic CHF/NICM - he appears to be on maximally tolerated medication regimen. He states Lasix was discontinued by nephrology so we removed from list. Will have him discontinue hydralazine completely as he was only taking 2 tablets per week any way, and still occasionally dropping BP at dialysis. He was only taking carvedilol once a day. Since this is meant to be a BID medicine, will cut it to 6.25mg  BID. Will arrange 2D echocardiogram. If LVEF remains 35% or less, will need to have discussion with EP about candidacy for defibrillator +/- referral to advanced CHF team for input regarding [place]. I am not sure if he would be an ideal candidate given his ESRD but would seek their formal input if needed. I asked him to continue to monitor his blood pressure at HD  and let us know if it  continues to be an issue at which time he may need to hold beta blocker on dialysis days. He seems compliant with sodium and fluid restriction per his report. 2. Mild CAD - no recent angina. Continue present regimen. 3. Essential HTN with episodic hypotension - follow with med changes as above. 4. Pulmonary HTN - this has been felt 2/2 CHF. Follow up pulmonary pressures by repeat echo.  Disposition: F/u with Dr. Meda Coffee in 4 months.   Medication Adjustments/Labs and Tests Ordered: Current medicines are reviewed at length with the patient today.  Concerns regarding medicines are outlined above. Medication changes, Labs and Tests ordered today are summarized above and listed in the Patient Instructions accessible in Encounters.   Signed, Charlie Pitter, PA-C  12/28/2016 4:15 PM    [facility] [place], [place],   [address] Phone: [phone]; Fax: [phone]

## 2016-12-28 NOTE — Patient Instructions (Signed)
Medication Instructions:  Your physician has recommended you make the following change in your medication:  1) STOP Lasix 2) STOP Hydralazine 3) REDUCE Carvedilol to 6.25mg  twice daily   Your Isosorbide has been refilled today  Labwork: None ordered  Testing/Procedures: Your physician has requested that you have an echocardiogram. Echocardiography is a painless test that uses sound waves to create images of your heart. It provides your doctor with information about the size and shape of your heart and how well your heart's chambers and valves are working. This procedure takes approximately one hour. There are no restrictions for this procedure.   Follow-Up: Your physician recommends that you schedule a follow-up appointment in: 4 months with Dr.Nelson   Any Other Special Instructions Will Be Listed Below (If Applicable). Please bring all of your medications with you to your next appointment    If you need a refill on your cardiac medications before your next appointment, please call your pharmacy.

## 2016-12-29 DIAGNOSIS — N2581 Secondary hyperparathyroidism of renal origin: Secondary | ICD-10-CM | POA: Diagnosis not present

## 2016-12-29 DIAGNOSIS — N186 End stage renal disease: Secondary | ICD-10-CM | POA: Diagnosis not present

## 2016-12-31 DIAGNOSIS — N2581 Secondary hyperparathyroidism of renal origin: Secondary | ICD-10-CM | POA: Diagnosis not present

## 2016-12-31 DIAGNOSIS — R51 Headache: Secondary | ICD-10-CM | POA: Diagnosis not present

## 2016-12-31 DIAGNOSIS — N186 End stage renal disease: Secondary | ICD-10-CM | POA: Diagnosis not present

## 2017-01-02 DIAGNOSIS — N186 End stage renal disease: Secondary | ICD-10-CM | POA: Diagnosis not present

## 2017-01-02 DIAGNOSIS — N2581 Secondary hyperparathyroidism of renal origin: Secondary | ICD-10-CM | POA: Diagnosis not present

## 2017-01-02 DIAGNOSIS — R51 Headache: Secondary | ICD-10-CM | POA: Diagnosis not present

## 2017-01-05 DIAGNOSIS — N186 End stage renal disease: Secondary | ICD-10-CM | POA: Diagnosis not present

## 2017-01-05 DIAGNOSIS — N2581 Secondary hyperparathyroidism of renal origin: Secondary | ICD-10-CM | POA: Diagnosis not present

## 2017-01-05 DIAGNOSIS — R51 Headache: Secondary | ICD-10-CM | POA: Diagnosis not present

## 2017-01-08 ENCOUNTER — Telehealth: Payer: Self-pay | Admitting: Family Medicine

## 2017-01-08 DIAGNOSIS — N186 End stage renal disease: Secondary | ICD-10-CM | POA: Diagnosis not present

## 2017-01-08 NOTE — Telephone Encounter (Signed)
Needs refill on meds for gout, he doesn't know the name of medication States hand swelling and hurting again   [place]

## 2017-01-08 NOTE — Telephone Encounter (Signed)
LMTCB

## 2017-01-08 NOTE — Telephone Encounter (Signed)
Have him take Tylenol for right now and schedule an appointment.  Explained that since he is having kidney issues, we have to be careful with medications

## 2017-01-09 NOTE — Telephone Encounter (Signed)
LM on vcm for him to take tylenol. Advised to CB with any questions. Victorino December

## 2017-01-10 DIAGNOSIS — N186 End stage renal disease: Secondary | ICD-10-CM | POA: Diagnosis not present

## 2017-01-12 DIAGNOSIS — N186 End stage renal disease: Secondary | ICD-10-CM | POA: Diagnosis not present

## 2017-01-12 DIAGNOSIS — Z992 Dependence on renal dialysis: Secondary | ICD-10-CM | POA: Diagnosis not present

## 2017-01-12 DIAGNOSIS — E1129 Type 2 diabetes mellitus with other diabetic kidney complication: Secondary | ICD-10-CM | POA: Diagnosis not present

## 2017-01-17 DIAGNOSIS — N2581 Secondary hyperparathyroidism of renal origin: Secondary | ICD-10-CM | POA: Diagnosis not present

## 2017-01-17 DIAGNOSIS — N186 End stage renal disease: Secondary | ICD-10-CM | POA: Diagnosis not present

## 2017-01-18 DIAGNOSIS — E113511 Type 2 diabetes mellitus with proliferative diabetic retinopathy with macular edema, right eye: Secondary | ICD-10-CM | POA: Diagnosis not present

## 2017-01-19 DIAGNOSIS — N186 End stage renal disease: Secondary | ICD-10-CM | POA: Diagnosis not present

## 2017-01-19 DIAGNOSIS — N2581 Secondary hyperparathyroidism of renal origin: Secondary | ICD-10-CM | POA: Diagnosis not present

## 2017-01-24 DIAGNOSIS — N2581 Secondary hyperparathyroidism of renal origin: Secondary | ICD-10-CM | POA: Diagnosis not present

## 2017-01-24 DIAGNOSIS — N186 End stage renal disease: Secondary | ICD-10-CM | POA: Diagnosis not present

## 2017-01-26 DIAGNOSIS — N186 End stage renal disease: Secondary | ICD-10-CM | POA: Diagnosis not present

## 2017-01-26 DIAGNOSIS — N2581 Secondary hyperparathyroidism of renal origin: Secondary | ICD-10-CM | POA: Diagnosis not present

## 2017-01-29 DIAGNOSIS — N2581 Secondary hyperparathyroidism of renal origin: Secondary | ICD-10-CM | POA: Diagnosis not present

## 2017-01-29 DIAGNOSIS — N186 End stage renal disease: Secondary | ICD-10-CM | POA: Diagnosis not present

## 2017-01-31 DIAGNOSIS — N2581 Secondary hyperparathyroidism of renal origin: Secondary | ICD-10-CM | POA: Diagnosis not present

## 2017-01-31 DIAGNOSIS — N186 End stage renal disease: Secondary | ICD-10-CM | POA: Diagnosis not present

## 2017-02-01 ENCOUNTER — Other Ambulatory Visit: Payer: Self-pay

## 2017-02-01 ENCOUNTER — Ambulatory Visit (HOSPITAL_COMMUNITY): Payer: BLUE CROSS/BLUE SHIELD | Attending: Physician Assistant

## 2017-02-01 DIAGNOSIS — I5022 Chronic systolic (congestive) heart failure: Secondary | ICD-10-CM | POA: Insufficient documentation

## 2017-02-01 DIAGNOSIS — I2510 Atherosclerotic heart disease of native coronary artery without angina pectoris: Secondary | ICD-10-CM | POA: Diagnosis not present

## 2017-02-01 DIAGNOSIS — I340 Nonrheumatic mitral (valve) insufficiency: Secondary | ICD-10-CM | POA: Insufficient documentation

## 2017-02-01 DIAGNOSIS — K219 Gastro-esophageal reflux disease without esophagitis: Secondary | ICD-10-CM | POA: Diagnosis not present

## 2017-02-01 DIAGNOSIS — I428 Other cardiomyopathies: Secondary | ICD-10-CM | POA: Diagnosis not present

## 2017-02-01 DIAGNOSIS — I272 Pulmonary hypertension, unspecified: Secondary | ICD-10-CM | POA: Diagnosis not present

## 2017-02-01 DIAGNOSIS — N186 End stage renal disease: Secondary | ICD-10-CM | POA: Insufficient documentation

## 2017-02-07 ENCOUNTER — Telehealth: Payer: Self-pay | Admitting: Cardiology

## 2017-02-07 DIAGNOSIS — N186 End stage renal disease: Secondary | ICD-10-CM | POA: Diagnosis not present

## 2017-02-07 DIAGNOSIS — N2581 Secondary hyperparathyroidism of renal origin: Secondary | ICD-10-CM | POA: Diagnosis not present

## 2017-02-07 NOTE — Telephone Encounter (Signed)
Attempted to return call. Mailbox is full- unable to leave message.  Will try again later.

## 2017-02-07 NOTE — Telephone Encounter (Signed)
Austin Kramer is returning a call . Thanks

## 2017-02-07 NOTE — Telephone Encounter (Signed)
Attempted to call patient, but there was no answer. VM full unable to leave message.

## 2017-02-07 NOTE — Telephone Encounter (Signed)
-----   Message from Dorothy Spark, MD sent at 02/02/2017 10:57 AM EST ----- His LVEF has decreased again, now 30-35%, please refer to EP fr an ICD placement consideration

## 2017-02-08 ENCOUNTER — Telehealth: Payer: Self-pay | Admitting: Family Medicine

## 2017-02-08 ENCOUNTER — Encounter: Payer: Self-pay | Admitting: *Deleted

## 2017-02-08 MED ORDER — COLCHICINE 0.6 MG PO TABS: 0.6000 mg | ORAL_TABLET | Freq: Two times a day (BID) | ORAL | 0 refills | Status: DC

## 2017-02-08 NOTE — Telephone Encounter (Signed)
Attempted to call patient to report results and plan of care. Patient's home phone rings, then after message plays there are a series of beeps; unable to leave message. Patient's cell phone voice mail is full and number given for wife's cell voice mail is also full.

## 2017-02-08 NOTE — Telephone Encounter (Signed)
Pt called and stated he is having a gout flair up and would like a refill on colchicine. Please send to [facility] on [address]. Pt can be reached at [phone].

## 2017-02-09 DIAGNOSIS — N186 End stage renal disease: Secondary | ICD-10-CM | POA: Diagnosis not present

## 2017-02-09 DIAGNOSIS — N2581 Secondary hyperparathyroidism of renal origin: Secondary | ICD-10-CM | POA: Diagnosis not present

## 2017-02-11 DIAGNOSIS — N2581 Secondary hyperparathyroidism of renal origin: Secondary | ICD-10-CM | POA: Diagnosis not present

## 2017-02-11 DIAGNOSIS — N186 End stage renal disease: Secondary | ICD-10-CM | POA: Diagnosis not present

## 2017-02-12 DIAGNOSIS — E1129 Type 2 diabetes mellitus with other diabetic kidney complication: Secondary | ICD-10-CM | POA: Diagnosis not present

## 2017-02-12 DIAGNOSIS — Z992 Dependence on renal dialysis: Secondary | ICD-10-CM | POA: Diagnosis not present

## 2017-02-12 DIAGNOSIS — N186 End stage renal disease: Secondary | ICD-10-CM | POA: Diagnosis not present

## 2017-02-14 DIAGNOSIS — N186 End stage renal disease: Secondary | ICD-10-CM | POA: Diagnosis not present

## 2017-02-14 DIAGNOSIS — N2581 Secondary hyperparathyroidism of renal origin: Secondary | ICD-10-CM | POA: Diagnosis not present

## 2017-02-14 NOTE — Telephone Encounter (Signed)
Left a message for the pt to call back on his phone number.  Attempted to call the pts cell and no answer and VM full.

## 2017-02-15 DIAGNOSIS — E113512 Type 2 diabetes mellitus with proliferative diabetic retinopathy with macular edema, left eye: Secondary | ICD-10-CM | POA: Diagnosis not present

## 2017-02-16 DIAGNOSIS — N2581 Secondary hyperparathyroidism of renal origin: Secondary | ICD-10-CM | POA: Diagnosis not present

## 2017-02-16 DIAGNOSIS — N186 End stage renal disease: Secondary | ICD-10-CM | POA: Diagnosis not present

## 2017-02-19 DIAGNOSIS — N186 End stage renal disease: Secondary | ICD-10-CM | POA: Diagnosis not present

## 2017-02-19 DIAGNOSIS — N2581 Secondary hyperparathyroidism of renal origin: Secondary | ICD-10-CM | POA: Diagnosis not present

## 2017-02-21 DIAGNOSIS — N186 End stage renal disease: Secondary | ICD-10-CM | POA: Diagnosis not present

## 2017-02-21 DIAGNOSIS — N2581 Secondary hyperparathyroidism of renal origin: Secondary | ICD-10-CM | POA: Diagnosis not present

## 2017-02-23 DIAGNOSIS — N2581 Secondary hyperparathyroidism of renal origin: Secondary | ICD-10-CM | POA: Diagnosis not present

## 2017-02-23 DIAGNOSIS — N186 End stage renal disease: Secondary | ICD-10-CM | POA: Diagnosis not present

## 2017-02-23 NOTE — Telephone Encounter (Signed)
Notified the pt that per Dr Meda Coffee, his echo showed that his LVEF has decreased again, now 30-35%, and she recommends that we refer to EP fr an ICD placement consideration.  Informed the pt that I will send a message to our [place], and she will call the pt back to arrange for this appt to be made.  Pt verbalized understanding and agrees with this plan.

## 2017-02-26 DIAGNOSIS — N186 End stage renal disease: Secondary | ICD-10-CM | POA: Diagnosis not present

## 2017-02-26 DIAGNOSIS — N2581 Secondary hyperparathyroidism of renal origin: Secondary | ICD-10-CM | POA: Diagnosis not present

## 2017-02-28 DIAGNOSIS — N186 End stage renal disease: Secondary | ICD-10-CM | POA: Diagnosis not present

## 2017-02-28 DIAGNOSIS — N2581 Secondary hyperparathyroidism of renal origin: Secondary | ICD-10-CM | POA: Diagnosis not present

## 2017-03-02 DIAGNOSIS — N2581 Secondary hyperparathyroidism of renal origin: Secondary | ICD-10-CM | POA: Diagnosis not present

## 2017-03-02 DIAGNOSIS — N186 End stage renal disease: Secondary | ICD-10-CM | POA: Diagnosis not present

## 2017-03-05 DIAGNOSIS — N2581 Secondary hyperparathyroidism of renal origin: Secondary | ICD-10-CM | POA: Diagnosis not present

## 2017-03-05 DIAGNOSIS — N186 End stage renal disease: Secondary | ICD-10-CM | POA: Diagnosis not present

## 2017-03-07 DIAGNOSIS — N186 End stage renal disease: Secondary | ICD-10-CM | POA: Diagnosis not present

## 2017-03-07 DIAGNOSIS — N2581 Secondary hyperparathyroidism of renal origin: Secondary | ICD-10-CM | POA: Diagnosis not present

## 2017-03-09 ENCOUNTER — Telehealth: Payer: Self-pay | Admitting: *Deleted

## 2017-03-09 DIAGNOSIS — N186 End stage renal disease: Secondary | ICD-10-CM | POA: Diagnosis not present

## 2017-03-09 DIAGNOSIS — N2581 Secondary hyperparathyroidism of renal origin: Secondary | ICD-10-CM | POA: Diagnosis not present

## 2017-03-09 NOTE — Telephone Encounter (Signed)
-----   Message from Debbora Dus sent at 03/09/2017  4:49 PM EST ----- Regarding: RE: refer to EP for ICD Consideration Hey,  No I haven't I have left messages on his VM, 02-26-17, 03-02-17, and 03-09-17.  Thanks, Melissa ----- Message ----- From: Nuala Alpha, LPN Sent: 3/64/3837   9:06 AM To: Melissa A Aida Puffer Subject: FW: refer to EP for ICD Consideration          Were you able to make contact with this pt?  He's hard to get in touch with at times.  Thanks,   Karlene Einstein   ----- Message ----- From: Nuala Alpha, LPN Sent: 7/93/9688   4:53 PM To: Melissa A Tatum Subject: refer to EP for ICD Consideration              This pt needs to be referred and scheduled to see someone in EP for noted 30-35 % LVEF on recent echo.   Pt is aware that you will be calling him back to arrange this appt.   Can you please call and arrange new pt appt?  Thanks,   [facility]

## 2017-03-12 DIAGNOSIS — N186 End stage renal disease: Secondary | ICD-10-CM | POA: Diagnosis not present

## 2017-03-12 DIAGNOSIS — R52 Pain, unspecified: Secondary | ICD-10-CM | POA: Diagnosis not present

## 2017-03-12 DIAGNOSIS — N2581 Secondary hyperparathyroidism of renal origin: Secondary | ICD-10-CM | POA: Diagnosis not present

## 2017-03-14 DIAGNOSIS — N2581 Secondary hyperparathyroidism of renal origin: Secondary | ICD-10-CM | POA: Diagnosis not present

## 2017-03-14 DIAGNOSIS — R52 Pain, unspecified: Secondary | ICD-10-CM | POA: Diagnosis not present

## 2017-03-14 DIAGNOSIS — N186 End stage renal disease: Secondary | ICD-10-CM | POA: Diagnosis not present

## 2017-03-15 DIAGNOSIS — E1129 Type 2 diabetes mellitus with other diabetic kidney complication: Secondary | ICD-10-CM | POA: Diagnosis not present

## 2017-03-15 DIAGNOSIS — Z992 Dependence on renal dialysis: Secondary | ICD-10-CM | POA: Diagnosis not present

## 2017-03-15 DIAGNOSIS — N186 End stage renal disease: Secondary | ICD-10-CM | POA: Diagnosis not present

## 2017-03-16 DIAGNOSIS — E1129 Type 2 diabetes mellitus with other diabetic kidney complication: Secondary | ICD-10-CM | POA: Diagnosis not present

## 2017-03-16 DIAGNOSIS — N186 End stage renal disease: Secondary | ICD-10-CM | POA: Diagnosis not present

## 2017-03-16 DIAGNOSIS — N2581 Secondary hyperparathyroidism of renal origin: Secondary | ICD-10-CM | POA: Diagnosis not present

## 2017-03-16 DIAGNOSIS — Z992 Dependence on renal dialysis: Secondary | ICD-10-CM | POA: Diagnosis not present

## 2017-03-19 DIAGNOSIS — N2581 Secondary hyperparathyroidism of renal origin: Secondary | ICD-10-CM | POA: Diagnosis not present

## 2017-03-19 DIAGNOSIS — N186 End stage renal disease: Secondary | ICD-10-CM | POA: Diagnosis not present

## 2017-03-22 ENCOUNTER — Ambulatory Visit: Payer: BLUE CROSS/BLUE SHIELD | Admitting: Internal Medicine

## 2017-03-22 ENCOUNTER — Encounter: Payer: Self-pay | Admitting: Internal Medicine

## 2017-03-22 ENCOUNTER — Encounter (INDEPENDENT_AMBULATORY_CARE_PROVIDER_SITE_OTHER): Payer: Self-pay

## 2017-03-22 VITALS — BP 134/74 | HR 87 | Ht 72.0 in | Wt 243.0 lb

## 2017-03-22 DIAGNOSIS — I5022 Chronic systolic (congestive) heart failure: Secondary | ICD-10-CM | POA: Diagnosis not present

## 2017-03-22 DIAGNOSIS — I428 Other cardiomyopathies: Secondary | ICD-10-CM | POA: Diagnosis not present

## 2017-03-22 NOTE — Progress Notes (Signed)
HPI Austin Kramer is referred today by Melina Copa for primary prevention ICD. He is a pleasant 57 yo man with ESRD on HD, non-ischemic CM, and class 2 CHF. His EF is 30%. He has not had syncope and he remains active. He has been placed on maximal medical therapy. He has minimal peripheral edema.  No Known Allergies   Current Outpatient Medications  Medication Sig Dispense Refill  . acetaminophen (TYLENOL) 500 MG tablet Take 500 mg by mouth daily as needed for moderate pain or headache.    Marland Kitchen aspirin 81 MG tablet Take 81 mg by mouth daily.    . calcitRIOL (ROCALTROL) 0.25 MCG capsule Take 0.25 mcg by mouth daily.    . calcium acetate (PHOSLO) 667 MG capsule Take 2 capsules (1,334 mg total) by mouth 3 (three) times daily with meals. 90 capsule 0  . carvedilol (COREG) 6.25 MG tablet Take 1 tablet (6.25 mg total) 2 (two) times daily by mouth. 180 tablet 1  . Cholecalciferol (VITAMIN D PO) Take 1 tablet by mouth daily.    . colchicine 0.6 MG tablet Take 1 tablet (0.6 mg total) by mouth 2 (two) times daily. 60 tablet 0  . fish oil-omega-3 fatty acids 1000 MG capsule Take 1 g by mouth daily.    . insulin detemir (LEVEMIR) 100 UNIT/ML injection Inject 0.1 mLs (10 Units total) into the skin 2 (two) times daily. 20 mL 0  . insulin lispro (HUMALOG) 100 UNIT/ML injection Inject 7 Units into the skin 3 (three) times daily before meals.    . isosorbide mononitrate (IMDUR) 30 MG 24 hr tablet Take 0.5 tablets (15 mg total) daily by mouth. 90 tablet 3  . Magnesium 250 MG TABS Take 250 mg by mouth daily.     . Misc Natural Products (TART CHERRY ADVANCED PO) Take 1 tablet by mouth daily.     . Multiple Minerals (CALCIUM/MAGNESIUM/ZINC PO) Take 1 tablet by mouth daily.     . Multiple Vitamin (MULTIVITAMIN WITH MINERALS) TABS Take 1 tablet by mouth daily.    . rosuvastatin (CRESTOR) 20 MG tablet Take 1 tablet (20 mg total) by mouth daily. 90 tablet 3  . traMADol (ULTRAM) 50 MG tablet Take 50 mg by mouth  every 6 (six) hours as needed (pain).    . vitamin B-12 (CYANOCOBALAMIN) 1000 MCG tablet Take 1,000 mcg by mouth daily.    Marland Kitchen VITAMIN E PO Take 1 capsule by mouth daily.     No current facility-administered medications for this visit.      Past Medical History:  Diagnosis Date  . Anemia   . CAD (coronary artery disease) 09/12/2016   R/L heart cath 09/01/16:  nl LAD, pLCx 25, pRCA 25, EF 25-35; Mean RA 8, PASP 60, mean PA 43, LVEDP 29  . Chronic systolic CHF (congestive heart failure) ([place])    Echo 12/17: EF 40-45 // L HC 7/18: EF 25-35, LVEDP 29  . Diabetes mellitus   . ED (erectile dysfunction)   . ESRD on hemodialysis ([place])    a. started HD 07/2016  . GERD (gastroesophageal reflux disease)    occasional  . Hyperlipidemia   . Hypertension    [name]  . NICM (nonischemic cardiomyopathy) ([place])    a. Echo 12/17-mod LVH, EF 40-45, diff HK, diast+syst flattening, RVH, mild LAE/RAE, mild MR, PASP 66mmHg //  Nuclear stress test 01/2016: EF 42, no ischemia. // R/L Ht Cath 7/18: min cor plaque; elev LVEDP,  elev PASP >>   . Obesity   . Peripheral neuropathy    neuropathy, "tingling in feet"  . PPD positive, treated   . Prostate cancer ([place])   . Pulmonary hypertension ([place])    severe PAH by 01/2016 echo  . Pulmonary hypertension ([place])    a. PASP 94 by echo 01/2016. b. After initiation of dialysis, cath 08/2016 showed moderate pulm HTN.    ROS:   All systems reviewed and negative except as noted in the HPI.   Past Surgical History:  Procedure Laterality Date  . AMPUTATION  01/26/2012   Procedure: AMPUTATION RAY;  Surgeon: Newt Minion, MD;  Location: [facility];  Service: Orthopedics;  Laterality: Right;  Right foot 2nd ray amputation  . AV FISTULA PLACEMENT Left 10/09/2014   Procedure: CREATION OF LEFT RADIOCEPHALIC ARTERIOVENOUS (AV) FISTULA ;  Surgeon: Serafina Mitchell, MD;  Location: [facility];  Service: Vascular;  Laterality: Left;  . COLONOSCOPY    . FRACTURE SURGERY      shoulder surgery  . KNEE CARTILAGE SURGERY    . PARS PLANA VITRECTOMY Left 12/17/2012   Procedure: LEFT PARS PLANA VITRECTOMY WITH 25 GAUGE/ENDO LASER;  Surgeon: Hurman Horn, MD;  Location: [facility];  Service: Ophthalmology;  Laterality: Left;  . PARS PLANA VITRECTOMY Right 02/05/2016   Procedure: PARS PLANA VITRECTOMY WITH 25 GAUGE;  Surgeon: Jalene Mullet, MD;  Location: [facility];  Service: Ophthalmology;  Laterality: Right;  with block  . PHOTOCOAGULATION WITH LASER Left 12/17/2012   Procedure: PHOTOCOAGULATION WITH LASER;  Surgeon: Hurman Horn, MD;  Location: [facility];  Service: Ophthalmology;  Laterality: Left;  . PROSTATECTOMY  2011  . RIGHT/LEFT HEART CATH AND CORONARY ANGIOGRAPHY N/A 09/01/2016   Procedure: Right/Left Heart Cath and Coronary Angiography;  Surgeon: Martinique, Peter M, MD;  Location: [facility];  Service: Cardiovascular;  Laterality: N/A;  . VASECTOMY       Family History  Problem Relation Age of Onset  . Diabetes Mother   . Diabetes Father      Social History   Socioeconomic History  . Marital status: Married    Spouse name: Not on file  . Number of children: 4  . Years of education: Not on file  . Highest education level: Not on file  Social Needs  . Financial resource strain: Not on file  . Food insecurity - worry: Not on file  . Food insecurity - inability: Not on file  . Transportation needs - medical: Not on file  . Transportation needs - non-medical: Not on file  Occupational History  . Occupation: Proofreader  Tobacco Use  . Smoking status: Never Smoker  . Smokeless tobacco: Never Used  Substance and Sexual Activity  . Alcohol use: No  . Drug use: No  . Sexual activity: Yes  Other Topics Concern  . Not on file  Social History Narrative  . Not on file     BP 134/74   Pulse 87   Ht 6' (1.829 m)   Wt 243 lb (110.2 kg)   BMI 32.96 kg/m   Physical Exam:  Well appearing NAD HEENT: Unremarkable Neck:  7 cm JVD, no  thyromegally Lymphatics:  No adenopathy Back:  No CVA tenderness Lungs:  Clear with no wheezes HEART:  Regular rate rhythm, no murmurs, no rubs, no clicks Abd:  soft, positive bowel sounds, no organomegally, no rebound, no guarding Ext:  2 plus pulses, no edema, no cyanosis, no clubbing Skin:  No  rashes no nodules Neuro:  CN II through XII intact, motor grossly intact  EKG - NSR with IVCD  DEVICE  Normal device function.  See PaceArt for details.   Assess/Plan: 1. Chronic systolic heart failure - the patient has been referred to consider a primary prevention ICD. I have outlined the considerations. While he has a non-ischemic CM and an EF of 30%, he has ESRD and is on chronic HD. I have discussed the lack of data in this segement of the population with regard to the decision for an ICD. He is considering his options and will call us.  2. HTN - his blood pressure is reasonably well controlled.

## 2017-03-22 NOTE — Patient Instructions (Addendum)
Medication Instructions:  Your physician recommends that you continue on your current medications as directed. Please refer to the Current Medication list given to you today.  Labwork: None ordered.  Testing/Procedures: None ordered.  Follow-Up: Follow up with Dr. Lovena Le in 4-6 weeks to discuss ICD placement.  Any Other Special Instructions Will Be Listed Below (If Applicable).  If you need a refill on your cardiac medications before your next appointment, please call your pharmacy.   Cardioverter Defibrillator Implantation An implantable cardioverter defibrillator (ICD) is a small device that is placed under the skin in the chest or abdomen. An ICD consists of a battery, a small computer (pulse generator), and wires (leads) that go into the heart. An ICD is used to detect and correct two types of dangerous irregular heartbeats (arrhythmias):  A rapid heart rhythm (tachycardia).  An arrhythmia in which the lower chambers of the heart (ventricles) contract in an uncoordinated way (fibrillation).  When an ICD detects tachycardia, it sends a low-energy shock to the heart to restore the heartbeat to normal (cardioversion). This signal is usually painless. If cardioversion does not work or if the ICD detects fibrillation, it delivers a high-energy shock to the heart (defibrillation) to restart the heart. This shock may feel like a strong jolt in the chest. Your health care provider may prescribe an ICD if:  You have had an arrhythmia that originated in the ventricles.  Your heart has been damaged by a disease or heart condition.  Sometimes, ICDs are programmed to act as a device called a pacemaker. Pacemakers can be used to treat a slow heartbeat (bradycardia) or tachycardia by taking over the heart rate with electrical impulses. Tell a health care provider about:  Any allergies you have.  All medicines you are taking, including vitamins, herbs, eye drops, creams, and over-the-counter  medicines.  Any problems you or family members have had with anesthetic medicines.  Any blood disorders you have.  Any surgeries you have had.  Any medical conditions you have.  Whether you are pregnant or may be pregnant. What are the risks? Generally, this is a safe procedure. However, problems may occur, including:  Swelling, bleeding, or bruising.  Infection.  Blood clots.  Damage to other structures or organs, such as nerves, blood vessels, or the heart.  Allergic reactions to medicines used during the procedure.  What happens before the procedure? Staying hydrated Follow instructions from your health care provider about hydration, which may include:  Up to 2 hours before the procedure - you may continue to drink clear liquids, such as water, clear fruit juice, black coffee, and plain tea.  Eating and drinking restrictions Follow instructions from your health care provider about eating and drinking, which may include:  8 hours before the procedure - stop eating heavy meals or foods such as meat, fried foods, or fatty foods.  6 hours before the procedure - stop eating light meals or foods, such as toast or cereal.  6 hours before the procedure - stop drinking milk or drinks that contain milk.  2 hours before the procedure - stop drinking clear liquids.  Medicine Ask your health care provider about:  Changing or stopping your normal medicines. This is important if you take diabetes medicines or blood thinners.  Taking medicines such as aspirin and ibuprofen. These medicines can thin your blood. Do not take these medicines before your procedure if your doctor tells you not to.  Tests  You may have blood tests.  You may have  a test to check the electrical signals in your heart (electrocardiogram, ECG).  You may have imaging tests, such as a chest X-ray. General instructions  For 24 hours before the procedure, stop using products that contain nicotine or  tobacco, such as cigarettes and e-cigarettes. If you need help quitting, ask your health care provider.  Plan to have someone take you home from the hospital or clinic.  You may be asked to shower with a germ-killing soap. What happens during the procedure?  To reduce your risk of infection: ? Your health care team will wash or sanitize their hands. ? Your skin will be washed with soap. ? Hair may be removed from the surgical area.  Small monitors will be put on your body. They will be used to check your heart, blood pressure, and oxygen level.  An IV tube will be inserted into one of your veins.  You will be given one or more of the following: ? A medicine to help you relax (sedative). ? A medicine to numb the area (local anesthetic). ? A medicine to make you fall asleep (general anesthetic).  Leads will be guided through a blood vessel into your heart and attached to your heart muscles. Depending on the ICD, the leads may go into one ventricle or they may go into both ventricles and into an upper chamber of the heart. An X-ray machine (fluoroscope) will be usedto help guide the leads.  A small incision will be made to create a deep pocket under your skin.  The pulse generator will be placed into the pocket.  The ICD will be tested.  The incision will be closed with stitches (sutures), skin glue, or staples.  A bandage (dressing) will be placed over the incision. This procedure may vary among health care providers and hospitals. What happens after the procedure?  Your blood pressure, heart rate, breathing rate, and blood oxygen level will be monitored often until the medicines you were given have worn off.  A chest X-ray will be taken to check that the ICD is in the right place.  You will need to stay in the hospital for 1-2 days so your health care provider can make sure your ICD is working.  Do not drive for 24 hours if you received a sedative. Ask your health care  provider when it is safe for you to drive.  You may be given an identification card explaining that you have an ICD. Summary  An implantable cardioverter defibrillator (ICD) is a small device that is placed under the skin in the chest or abdomen. It is used to detect and correct dangerous irregular heartbeats (arrhythmias).  An ICD consists of a battery, a small computer (pulse generator), and wires (leads) that go into the heart.  When an ICD detects rapid heart rhythm (tachycardia), it sends a low-energy shock to the heart to restore the heartbeat to normal (cardioversion). If cardioversion does not work or if the ICD detects uncoordinated heart contractions (fibrillation), it delivers a high-energy shock to the heart (defibrillation) to restart the heart.  You will need to stay in the hospital for 1-2 days to make sure your ICD is working. This information is not intended to replace advice given to you by your health care provider. Make sure you discuss any questions you have with your health care provider. Document Released: 10/22/2001 Document Revised: 02/09/2016 Document Reviewed: 02/09/2016 Elsevier Interactive Patient Education  2017 [employer].

## 2017-03-23 DIAGNOSIS — N186 End stage renal disease: Secondary | ICD-10-CM | POA: Diagnosis not present

## 2017-03-23 DIAGNOSIS — N2581 Secondary hyperparathyroidism of renal origin: Secondary | ICD-10-CM | POA: Diagnosis not present

## 2017-03-25 ENCOUNTER — Other Ambulatory Visit: Payer: Self-pay | Admitting: Physician Assistant

## 2017-03-25 ENCOUNTER — Other Ambulatory Visit: Payer: Self-pay | Admitting: Family Medicine

## 2017-03-25 DIAGNOSIS — M109 Gout, unspecified: Secondary | ICD-10-CM

## 2017-03-26 DIAGNOSIS — N186 End stage renal disease: Secondary | ICD-10-CM | POA: Diagnosis not present

## 2017-03-26 DIAGNOSIS — N2581 Secondary hyperparathyroidism of renal origin: Secondary | ICD-10-CM | POA: Diagnosis not present

## 2017-03-26 NOTE — Telephone Encounter (Signed)
Pt has CKD and w

## 2017-03-26 NOTE — Telephone Encounter (Signed)
Pt has CKD and was taking colchicine and it was stopped in June and restarted in dec. Should I send this in Pt says he is out and he does have a appt coming up in march. Please advise. Thanks [employer]

## 2017-03-27 ENCOUNTER — Telehealth: Payer: Self-pay | Admitting: Family Medicine

## 2017-03-27 NOTE — Telephone Encounter (Signed)
Called pt as he needs med check with fasting labs first then diabetes follow up.

## 2017-03-28 DIAGNOSIS — N186 End stage renal disease: Secondary | ICD-10-CM | POA: Diagnosis not present

## 2017-03-28 DIAGNOSIS — N2581 Secondary hyperparathyroidism of renal origin: Secondary | ICD-10-CM | POA: Diagnosis not present

## 2017-03-30 DIAGNOSIS — N186 End stage renal disease: Secondary | ICD-10-CM | POA: Diagnosis not present

## 2017-03-30 DIAGNOSIS — N2581 Secondary hyperparathyroidism of renal origin: Secondary | ICD-10-CM | POA: Diagnosis not present

## 2017-04-01 ENCOUNTER — Telehealth: Payer: Self-pay | Admitting: Medical

## 2017-04-01 NOTE — Telephone Encounter (Signed)
P.A. COLCHICINE

## 2017-04-02 DIAGNOSIS — N2581 Secondary hyperparathyroidism of renal origin: Secondary | ICD-10-CM | POA: Diagnosis not present

## 2017-04-02 DIAGNOSIS — N186 End stage renal disease: Secondary | ICD-10-CM | POA: Diagnosis not present

## 2017-04-04 DIAGNOSIS — N2581 Secondary hyperparathyroidism of renal origin: Secondary | ICD-10-CM | POA: Diagnosis not present

## 2017-04-04 DIAGNOSIS — N186 End stage renal disease: Secondary | ICD-10-CM | POA: Diagnosis not present

## 2017-04-05 ENCOUNTER — Telehealth: Payer: Self-pay | Admitting: Family Medicine

## 2017-04-05 NOTE — Telephone Encounter (Signed)
Call in Colcrys 0.6mg   DAW  One daily #90 one refill

## 2017-04-05 NOTE — Telephone Encounter (Signed)
[facility] is advising insurance only pays for brand name.  Caldrys 0.6 mg.  Please send in rx for Colcrys instead of Colchicine.

## 2017-04-09 ENCOUNTER — Other Ambulatory Visit: Payer: Self-pay

## 2017-04-09 DIAGNOSIS — N186 End stage renal disease: Secondary | ICD-10-CM | POA: Diagnosis not present

## 2017-04-09 DIAGNOSIS — N2581 Secondary hyperparathyroidism of renal origin: Secondary | ICD-10-CM | POA: Diagnosis not present

## 2017-04-09 MED ORDER — COLCHICINE 0.6 MG PO TABS: 0.6000 mg | ORAL_TABLET | Freq: Every day | ORAL | 1 refills | Status: DC

## 2017-04-09 NOTE — Telephone Encounter (Signed)
Done KH 

## 2017-04-11 DIAGNOSIS — N186 End stage renal disease: Secondary | ICD-10-CM | POA: Diagnosis not present

## 2017-04-11 DIAGNOSIS — N2581 Secondary hyperparathyroidism of renal origin: Secondary | ICD-10-CM | POA: Diagnosis not present

## 2017-04-12 DIAGNOSIS — E113512 Type 2 diabetes mellitus with proliferative diabetic retinopathy with macular edema, left eye: Secondary | ICD-10-CM | POA: Diagnosis not present

## 2017-04-13 ENCOUNTER — Encounter: Payer: Self-pay | Admitting: *Deleted

## 2017-04-13 DIAGNOSIS — N2581 Secondary hyperparathyroidism of renal origin: Secondary | ICD-10-CM | POA: Diagnosis not present

## 2017-04-13 DIAGNOSIS — N186 End stage renal disease: Secondary | ICD-10-CM | POA: Diagnosis not present

## 2017-04-16 DIAGNOSIS — N2581 Secondary hyperparathyroidism of renal origin: Secondary | ICD-10-CM | POA: Diagnosis not present

## 2017-04-16 DIAGNOSIS — N186 End stage renal disease: Secondary | ICD-10-CM | POA: Diagnosis not present

## 2017-04-18 DIAGNOSIS — N2581 Secondary hyperparathyroidism of renal origin: Secondary | ICD-10-CM | POA: Diagnosis not present

## 2017-04-18 DIAGNOSIS — N186 End stage renal disease: Secondary | ICD-10-CM | POA: Diagnosis not present

## 2017-04-19 ENCOUNTER — Ambulatory Visit (INDEPENDENT_AMBULATORY_CARE_PROVIDER_SITE_OTHER): Payer: BLUE CROSS/BLUE SHIELD | Admitting: Cardiology

## 2017-04-19 ENCOUNTER — Ambulatory Visit (INDEPENDENT_AMBULATORY_CARE_PROVIDER_SITE_OTHER): Payer: BLUE CROSS/BLUE SHIELD | Admitting: Internal Medicine

## 2017-04-19 ENCOUNTER — Encounter: Payer: Self-pay | Admitting: Internal Medicine

## 2017-04-19 ENCOUNTER — Encounter: Payer: Self-pay | Admitting: Cardiology

## 2017-04-19 VITALS — BP 144/80 | HR 95 | Ht 72.0 in | Wt 242.0 lb

## 2017-04-19 VITALS — BP 130/64 | HR 98 | Ht 72.0 in | Wt 241.0 lb

## 2017-04-19 DIAGNOSIS — I5022 Chronic systolic (congestive) heart failure: Secondary | ICD-10-CM

## 2017-04-19 DIAGNOSIS — I110 Hypertensive heart disease with heart failure: Secondary | ICD-10-CM | POA: Diagnosis not present

## 2017-04-19 DIAGNOSIS — R7989 Other specified abnormal findings of blood chemistry: Secondary | ICD-10-CM | POA: Diagnosis not present

## 2017-04-19 DIAGNOSIS — E785 Hyperlipidemia, unspecified: Secondary | ICD-10-CM | POA: Diagnosis not present

## 2017-04-19 DIAGNOSIS — I428 Other cardiomyopathies: Secondary | ICD-10-CM

## 2017-04-19 DIAGNOSIS — I2510 Atherosclerotic heart disease of native coronary artery without angina pectoris: Secondary | ICD-10-CM | POA: Diagnosis not present

## 2017-04-19 LAB — COMPREHENSIVE METABOLIC PANEL
ALT: 15 IU/L (ref 0–44)
AST: 18 IU/L (ref 0–40)
Albumin/Globulin Ratio: 1 — ABNORMAL LOW (ref 1.2–2.2)
Albumin: 3.8 g/dL (ref 3.5–5.5)
Alkaline Phosphatase: 61 IU/L (ref 39–117)
BUN/Creatinine Ratio: 5 — ABNORMAL LOW (ref 9–20)
BUN: 29 mg/dL — ABNORMAL HIGH (ref 6–24)
Bilirubin Total: 0.4 mg/dL (ref 0.0–1.2)
CO2: 28 mmol/L (ref 20–29)
Calcium: 8.8 mg/dL (ref 8.7–10.2)
Chloride: 90 mmol/L — ABNORMAL LOW (ref 96–106)
Creatinine, Ser: 6.25 mg/dL — ABNORMAL HIGH (ref 0.76–1.27)
GFR calc Af Amer: 11 mL/min/{1.73_m2} — ABNORMAL LOW (ref >59)
GFR calc non Af Amer: 9 mL/min/{1.73_m2} — ABNORMAL LOW (ref >59)
Globulin, Total: 3.7 g/dL (ref 1.5–4.5)
Glucose: 291 mg/dL — ABNORMAL HIGH (ref 65–99)
Potassium: 4 mmol/L (ref 3.5–5.2)
Sodium: 136 mmol/L (ref 134–144)
Total Protein: 7.5 g/dL (ref 6.0–8.5)

## 2017-04-19 LAB — MAGNESIUM: Magnesium: 2 mg/dL (ref 1.6–2.3)

## 2017-04-19 LAB — CBC WITH DIFFERENTIAL/PLATELET
Basophils Absolute: 0 10*3/uL (ref 0.0–0.2)
Basos: 0 %
EOS (ABSOLUTE): 0 10*3/uL (ref 0.0–0.4)
Eos: 0 %
Hematocrit: 36.5 % — ABNORMAL LOW (ref 37.5–51.0)
Hemoglobin: 12.1 g/dL — ABNORMAL LOW (ref 13.0–17.7)
Immature Grans (Abs): 0 10*3/uL (ref 0.0–0.1)
Immature Granulocytes: 0 %
Lymphocytes Absolute: 0.9 10*3/uL (ref 0.7–3.1)
Lymphs: 8 %
MCH: 27.3 pg (ref 26.6–33.0)
MCHC: 33.2 g/dL (ref 31.5–35.7)
MCV: 82 fL (ref 79–97)
Monocytes Absolute: 0.6 10*3/uL (ref 0.1–0.9)
Monocytes: 6 %
Neutrophils Absolute: 8.8 10*3/uL — ABNORMAL HIGH (ref 1.4–7.0)
Neutrophils: 86 %
Platelets: 209 10*3/uL (ref 150–379)
RBC: 4.44 x10E6/uL (ref 4.14–5.80)
RDW: 15.8 % — ABNORMAL HIGH (ref 12.3–15.4)
WBC: 10.3 10*3/uL (ref 3.4–10.8)

## 2017-04-19 LAB — TSH: TSH: 0.421 u[IU]/mL — ABNORMAL LOW (ref 0.450–4.500)

## 2017-04-19 MED ORDER — IVABRADINE HCL 5 MG PO TABS: 5.0000 mg | ORAL_TABLET | Freq: Two times a day (BID) | ORAL | 2 refills | Status: DC

## 2017-04-19 NOTE — Patient Instructions (Addendum)
Medication Instructions:  Your physician recommends that you continue on your current medications as directed. Please refer to the Current Medication list given to you today.  Labwork: None ordered.  Testing/Procedures: Your physician has recommended that you have a defibrillator inserted. An implantable cardioverter defibrillator (ICD) is a small device that is placed in your chest or, in rare cases, your abdomen. This device uses electrical pulses or shocks to help control life-threatening, irregular heartbeats that could lead the heart to suddenly stop beating (sudden cardiac arrest). Leads are attached to the ICD that goes into your heart. This is done in the hospital and usually requires an overnight stay. Please see the instruction sheet given to you today for more information.  Follow-Up:  The following days are available for procedures: March 18, 20, 22, 25 and 28  April 1, 4, 8, 11, 15, 18, 22, 23, 24, and 30  If you decide on a day please give me a call:  Bing Neighbors [phone]   Any Other Special Instructions Will Be Listed Below (If Applicable).  If you need a refill on your cardiac medications before your next appointment, please call your pharmacy.   Subcutaneous Cardioverter Defibrillator Implantation, Care After This sheet gives you information about how to care for yourself after your procedure. Your health care provider may also give you more specific instructions. If you have problems or questions, contact your health care provider. What can I expect after the procedure? After the procedure, it is common to have:  Some pain. It may last a few days.  A slight bump under the skin where the subcutaneous implantation cardioverter defibrillator (S-ICD) is. You may be able to feel the device under the skin. This is normal.  Follow these instructions at home: Medicines  Take over-the-counter and prescription medicines only as told by your health care provider.  If you  were prescribed an antibiotic medicine, take it as told by your health care provider. Do not stop taking the antibiotic even if you start to feel better. Incision care  Follow instructions from your health care provider about how to take care of your incision. Make sure you: ? Wash your hands with soap and water before you change your bandage (dressing). If soap and water are not available, use hand sanitizer. ? Change your dressing as told by your health care provider. ? Leave stitches (sutures), skin glue, or adhesive strips in place. These skin closures may need to stay in place for 2 weeks or longer. If adhesive strip edges start to loosen and curl up, you may trim the loose edges. Do not remove adhesive strips completely unless your health care provider tells you to do that.  Check your incision area every day for signs of infection. Check for: ? Redness, swelling, or pain. ? Fluid or blood. ? Warmth. ? Pus or a bad smell. Activity  Do not lift anything that is heavier than 10 lb (4.5 kg), or the limit that you are told, until your health care provider says that it is safe.  Avoid sports and any other activity that could cause a hit to the generator or leads. Ask your health care provider what activities are safe for you and when you may return to your normal activities.  Follow instructions from your health care provider about exercise and sexual activity restrictions after your procedure. Electric and magnetic fields  Tell all health care providers, including your dentist, that you have a defibrillator. They need to know this so  they do not give you an MRI scan, which uses strong magnets.  When using your cell phone, hold it to the ear that is on the opposite side from the defibrillator. Do not leave your cell phone in a pocket over the defibrillator.  If you must pass through a metal detector, quickly walk through it. Do not stop under the detector, and do not stand near  it.  Avoid places or objects that have a strong electric or magnetic field, including: ? Airport [employer]. At the airport, tell officials that you have a defibrillator. Your defibrillator ID card will let you be checked in a way that is safe for you and will not damage your defibrillator. Also, do not let a security person wave a magnetic wand near your defibrillator. That can make it stop working. ? Power plants. ? Large electrical generators. ? Anti-theft systems or electronic article surveillance (EAS). ? Radiofrequency transmission towers, such as cell phone and radio towers.  Do not use amateur (ham) radio equipment or electric (arc) welding torches.  Some devices are safe to use if they are held 12 inches (30 cm) or more away from your defibrillator. These include power tools, lawn mowers, and speakers. If you are not sure if something is safe to use, ask your health care provider.  Do not use MP3 player headphones. They have magnets.  You may safely use electric blankets, heating pads, computers, and microwave ovens. General instructions  Do not take baths, swim, shower, or use a hot tub until your health care provider approves. You may need to take sponge baths until your health care provider says that you may bathe or shower.  Do not drive until your health care provider approves.  Always keep your defibrillator ID card with you. The card should list the implant date, device model, and manufacturer. Consider wearing a medical alert bracelet or necklace that says that you have an S-ICD.  Do not use any products that contain nicotine or tobacco, such as cigarettes and e-cigarettes. If you need help quitting, ask your health care provider.  Have your defibrillator checked as often as told by your health care provider. Most S-ICDs last for 4-8 years before they need to be replaced. Contact a health care provider if:  You feel one shock in your chest.  You gain weight  suddenly.  You have a fever.  You have severe pain, and medicines do not help.  You have redness, swelling, or pain around your incision area.  You have pus or a bad smell coming from your incision area.  You have fluid or blood coming from your incision.  Your incision area feels warm to the touch.  Your heart feels like it is fluttering or skipping beats (heart palpitations).  You feel increased anxiety or depression. Get help right away if:  You feel more than one shock.  You have chest pain.  You have problems breathing or have shortness of breath.  You have dizziness or fainting. Summary  After the procedure, you may have some pain, see a bump under your skin, and feel the device under your skin.  Check your incision area every day for signs of infection.  Be careful around electric and magnetic fields.  Always keep your defibrillator ID card with you. This information is not intended to replace advice given to you by your health care provider. Make sure you discuss any questions you have with your health care provider. Document Released: 05/04/2016 Document Revised:  05/04/2016 Document Reviewed: 05/04/2016 Elsevier Interactive Patient Education  [employer].

## 2017-04-19 NOTE — Progress Notes (Signed)
HPI [name] returns today for followup. He is a pleasant middle aged man with chronic systolic heart failure, ESRD on HD, EF 30%, and syncope. When I saw him last, I recommended ICD insertion. He returns today with his wife for additional discussion.  No Known Allergies   Current Outpatient Medications  Medication Sig Dispense Refill  . acetaminophen (TYLENOL) 500 MG tablet Take 500 mg by mouth daily as needed for moderate pain or headache.    Marland Kitchen aspirin 81 MG tablet Take 81 mg by mouth daily.    . calcitRIOL (ROCALTROL) 0.25 MCG capsule Take 0.25 mcg by mouth daily.    . calcium acetate (PHOSLO) 667 MG capsule Take 2 capsules (1,334 mg total) by mouth 3 (three) times daily with meals. 90 capsule 0  . carvedilol (COREG) 6.25 MG tablet Take 1 tablet (6.25 mg total) 2 (two) times daily by mouth. 180 tablet 1  . Cholecalciferol (VITAMIN D PO) Take 1 tablet by mouth daily.    . colchicine 0.6 MG tablet Take 1 tablet (0.6 mg total) by mouth daily. 90 tablet 1  . fish oil-omega-3 fatty acids 1000 MG capsule Take 1 g by mouth daily.    . insulin detemir (LEVEMIR) 100 UNIT/ML injection Inject 0.1 mLs (10 Units total) into the skin 2 (two) times daily. 20 mL 0  . insulin lispro (HUMALOG) 100 UNIT/ML injection Inject 7 Units into the skin 3 (three) times daily before meals.    . isosorbide mononitrate (IMDUR) 30 MG 24 hr tablet Take 0.5 tablets (15 mg total) daily by mouth. 90 tablet 3  . ivabradine (CORLANOR) 5 MG TABS tablet Take 1 tablet (5 mg total) by mouth 2 (two) times daily with a meal. 180 tablet 2  . Magnesium 250 MG TABS Take 250 mg by mouth daily.     . Misc Natural Products (TART CHERRY ADVANCED PO) Take 1 tablet by mouth daily.     . Multiple Minerals (CALCIUM/MAGNESIUM/ZINC PO) Take 1 tablet by mouth daily.     . Multiple Vitamin (MULTIVITAMIN WITH MINERALS) TABS Take 1 tablet by mouth daily.    . rosuvastatin (CRESTOR) 20 MG tablet Take 1 tablet (20 mg total) by mouth  daily. 90 tablet 3  . traMADol (ULTRAM) 50 MG tablet Take 50 mg by mouth every 6 (six) hours as needed (pain).    . vitamin B-12 (CYANOCOBALAMIN) 1000 MCG tablet Take 1,000 mcg by mouth daily.    Marland Kitchen VITAMIN E PO Take 1 capsule by mouth daily.     No current facility-administered medications for this visit.      Past Medical History:  Diagnosis Date  . Anemia   . CAD (coronary artery disease) 09/12/2016   R/L heart cath 09/01/16:  nl LAD, pLCx 25, pRCA 25, EF 25-35; Mean RA 8, PASP 60, mean PA 43, LVEDP 29  . Chronic systolic CHF (congestive heart failure) ([place])    Echo 12/17: EF 40-45 // L HC 7/18: EF 25-35, LVEDP 29  . Diabetes mellitus   . ED (erectile dysfunction)   . ESRD on hemodialysis ([place])    a. started HD 07/2016  . GERD (gastroesophageal reflux disease)    occasional  . Hyperlipidemia   . Hypertension    [name]  . NICM (nonischemic cardiomyopathy) ([place])    a. Echo 12/17-mod LVH, EF 40-45, diff HK, diast+syst flattening, RVH, mild LAE/RAE, mild MR, PASP 31mmHg //  Nuclear stress test 01/2016: EF 42, no  ischemia. // R/L Ht Cath 7/18: min cor plaque; elev LVEDP, elev PASP >>   . Obesity   . Peripheral neuropathy    neuropathy, "tingling in feet"  . PPD positive, treated   . Prostate cancer ([place])   . Pulmonary hypertension ([place])    severe PAH by 01/2016 echo  . Pulmonary hypertension ([employer])    a. PASP 94 by echo 01/2016. b. After initiation of dialysis, cath 08/2016 showed moderate pulm HTN.    ROS:   All systems reviewed and negative except as noted in the HPI.   Past Surgical History:  Procedure Laterality Date  . AMPUTATION  01/26/2012   Procedure: AMPUTATION RAY;  Surgeon: Newt Minion, MD;  Location: [facility];  Service: Orthopedics;  Laterality: Right;  Right foot 2nd ray amputation  . AV FISTULA PLACEMENT Left 10/09/2014   Procedure: CREATION OF LEFT RADIOCEPHALIC ARTERIOVENOUS (AV) FISTULA ;  Surgeon: Serafina Mitchell, MD;  Location: [facility];  Service:  Vascular;  Laterality: Left;  . COLONOSCOPY    . FRACTURE SURGERY     shoulder surgery  . KNEE CARTILAGE SURGERY    . PARS PLANA VITRECTOMY Left 12/17/2012   Procedure: LEFT PARS PLANA VITRECTOMY WITH 25 GAUGE/ENDO LASER;  Surgeon: Hurman Horn, MD;  Location: [facility];  Service: Ophthalmology;  Laterality: Left;  . PARS PLANA VITRECTOMY Right 02/05/2016   Procedure: PARS PLANA VITRECTOMY WITH 25 GAUGE;  Surgeon: Jalene Mullet, MD;  Location: [facility];  Service: Ophthalmology;  Laterality: Right;  with block  . PHOTOCOAGULATION WITH LASER Left 12/17/2012   Procedure: PHOTOCOAGULATION WITH LASER;  Surgeon: Hurman Horn, MD;  Location: [facility];  Service: Ophthalmology;  Laterality: Left;  . PROSTATECTOMY  2011  . RIGHT/LEFT HEART CATH AND CORONARY ANGIOGRAPHY N/A 09/01/2016   Procedure: Right/Left Heart Cath and Coronary Angiography;  Surgeon: Martinique, Peter M, MD;  Location: [facility];  Service: Cardiovascular;  Laterality: N/A;  . VASECTOMY       Family History  Problem Relation Age of Onset  . Diabetes Mother   . Diabetes Father      Social History   Socioeconomic History  . Marital status: Married    Spouse name: Not on file  . Number of children: 4  . Years of education: Not on file  . Highest education level: Not on file  Social Needs  . Financial resource strain: Not on file  . Food insecurity - worry: Not on file  . Food insecurity - inability: Not on file  . Transportation needs - medical: Not on file  . Transportation needs - non-medical: Not on file  Occupational History  . Occupation: Proofreader  Tobacco Use  . Smoking status: Never Smoker  . Smokeless tobacco: Never Used  Substance and Sexual Activity  . Alcohol use: No  . Drug use: No  . Sexual activity: Yes  Other Topics Concern  . Not on file  Social History Narrative  . Not on file     BP 130/64   Pulse 98   Ht 6' (1.829 m)   Wt 241 lb (109.3 kg)   BMI 32.69 kg/m   Physical Exam:  Well  appearing 57 yo man, NAD HEENT: Unremarkable Neck:  6 cm JVD, no thyromegally Lymphatics:  No adenopathy Back:  No CVA tenderness Lungs:  Clear with no wheezes HEART:  Regular rate rhythm, no murmurs, no rubs, no clicks Abd:  soft, positive bowel sounds, no organomegally, no rebound, no guarding  Ext:  2 plus pulses, no edema, no cyanosis, no clubbing Skin:  No rashes no nodules Neuro:  CN II through XII intact, motor grossly intact  Assess/Plan: 1. Chronic systolic heart failure - I have discussed the treatment options with the patient and recommended insertion of a subcutaneous ICD. He will call us if he wishes to proceed. 2. HTN - his blood pressure is minimally elevated. Will follow.  Mikle Bosworth.D.

## 2017-04-19 NOTE — Patient Instructions (Signed)
Medication Instructions:   START TAKING IVABRADINE (CORLANOR) 5 MG TWICE DAILY WITH MEALS    Labwork:  TODAY--CMET, MAGNESIUM, CBC W DIFF, AND TSH    Testing/Procedures:  Your physician has requested that you have an echocardiogram. Echocardiography is a painless test that uses sound waves to create images of your heart. It provides your doctor with information about the size and shape of your heart and how well your heart's chambers and valves are working. This procedure takes approximately one hour. There are no restrictions for this procedure. DR Meda Coffee WOULD LIKE THIS SCHEDULED IN 3 MONTHS, BUT A COUPLE DAYS PRIOR TO YOUR 3 MONTH FOLLOW-UP APPOINTMENT WITH HER.     Follow-Up:  3 MONTHS WITH DR NELSON---PLEASE HAVE YOUR ECHO DONE A COUPLE DAYS PRIOR TO THIS OFFICE VISIT.       If you need a refill on your cardiac medications before your next appointment, please call your pharmacy.

## 2017-04-19 NOTE — Progress Notes (Signed)
Cardiology Office Note    Date:  04/19/2017  ID:  Austin Kramer, DOB [birth year], MRN [id number] PCP:  Denita Lung, MD  Cardiologist:  Dr. Meda Coffee   Chief Complaint: f/u cardiomyopathy  History of Present Illness:  Austin Kramer is a 57 y.o. male with history of chronic systolic CHF/non-ischemic cardiomyopathy (mild nonobstructive CAD by cath 08/2016), DM type 2 (since his 36s), HTN, HLD, ESRD (placed on dialysis 07/2016), prostate CA s/p prostatectomy, GERD, peripheral neuropathy, pulmonary HTN, anemia who presents for f/u.  He established care in 12/2015 for DOE, edema, and occasional chest discomfort. 2D Echo 02/02/16 showed moderate LVH, EF 40-45%, diffuse HK, diastolic flattening and systolic flattening, RVH, mild LAE/RAE, mild MR, PASP 65mmHg, elevated CVP. Nuclear stress test 01/2016: EF 42%, no ischemia. Due to his PASP Dr. Meda Coffee recommended further evaluation of PE. CTA was ordered but never performed - a message was left but it does not appear he returned the call. Creatinine at that time was 7-8 without dialysis. He managed by renal in the interim, and went on HD 07/2016. Right and left heart cath 09/01/16 showed 25% prox Cx, 25% prox RCA, EF 25-35%, moderate pulm HTN, mod elevated LVEDP. Recommendation was for intensification of CHF therapy as well as additional fluid removal at dialysis. He's had issues with blood pressure dropping at dialysis. He was last seen 10/11/16 by Richardson Dopp PA-C and felt to be doing well. Imdur 15mg  daily was added with plan to recheck echo once on max guideline directed therapy. Last labs 08/2016 showed Hgb 10.0, K 3.8, Cr 4.06. Lipids are followed by primary care.  He returns for follow-up today alone. He is doing great per his report. He feels so much better on dialysis. His edema has resolved. He was originally going to throw away all his shoes because they couldn't fit when he was so edematous but now he can wear them all. No CP, SOB, diaphoresis,  dizziness, syncope. He does report continued issue with BP dropping at HD. As a result he's only taking Coreg once a day (daily) and is only taking one hydralazine twice a week (Tues/Thurs). He also states he was taken off Lasix by nephrology. He feels better since starting the Imdur. Last time BP dropped was last week down to the 80s.  04/19/2017 - this is 3 months follow-up, the patient is doing great, he continues to work 10-12 hrs shifts where he left heavy boxes and he denies any chest pain or shortness of breath, he hasn't had any lower extremity edema, orthopnea paroxysmal nocturnal dyspnea. No palpitations dizziness or syncope. He is seeing Dr. Lovena Le today to discuss ICD placement. He states that at dialysis his blood pressure tends to be low to the point where they are scared to give him hemodialysis session.  Past Medical History:  Diagnosis Date  . Anemia   . CAD (coronary artery disease) 09/12/2016   R/L heart cath 09/01/16:  nl LAD, pLCx 25, pRCA 25, EF 25-35; Mean RA 8, PASP 60, mean PA 43, LVEDP 29  . Chronic systolic CHF (congestive heart failure) ([place])    Echo 12/17: EF 40-45 // L HC 7/18: EF 25-35, LVEDP 29  . Diabetes mellitus   . ED (erectile dysfunction)   . ESRD on hemodialysis ([place])    a. started HD 07/2016  . GERD (gastroesophageal reflux disease)    occasional  . Hyperlipidemia   . Hypertension    [name]  . NICM (nonischemic  cardiomyopathy) ([place])    a. Echo 12/17-mod LVH, EF 40-45, diff HK, diast+syst flattening, RVH, mild LAE/RAE, mild MR, PASP 21mmHg //  Nuclear stress test 01/2016: EF 42, no ischemia. // R/L Ht Cath 7/18: min cor plaque; elev LVEDP, elev PASP >>   . Obesity   . Peripheral neuropathy    neuropathy, "tingling in feet"  . PPD positive, treated   . Prostate cancer ([place])   . Pulmonary hypertension ([place])    severe PAH by 01/2016 echo  . Pulmonary hypertension ([place])    a. PASP 94 by echo 01/2016. b. After initiation of dialysis, cath  08/2016 showed moderate pulm HTN.    Past Surgical History:  Procedure Laterality Date  . AMPUTATION  01/26/2012   Procedure: AMPUTATION RAY;  Surgeon: Newt Minion, MD;  Location: [facility];  Service: Orthopedics;  Laterality: Right;  Right foot 2nd ray amputation  . AV FISTULA PLACEMENT Left 10/09/2014   Procedure: CREATION OF LEFT RADIOCEPHALIC ARTERIOVENOUS (AV) FISTULA ;  Surgeon: Serafina Mitchell, MD;  Location: [facility];  Service: Vascular;  Laterality: Left;  . COLONOSCOPY    . FRACTURE SURGERY     shoulder surgery  . KNEE CARTILAGE SURGERY    . PARS PLANA VITRECTOMY Left 12/17/2012   Procedure: LEFT PARS PLANA VITRECTOMY WITH 25 GAUGE/ENDO LASER;  Surgeon: Hurman Horn, MD;  Location: [facility];  Service: Ophthalmology;  Laterality: Left;  . PARS PLANA VITRECTOMY Right 02/05/2016   Procedure: PARS PLANA VITRECTOMY WITH 25 GAUGE;  Surgeon: Jalene Mullet, MD;  Location: [facility];  Service: Ophthalmology;  Laterality: Right;  with block  . PHOTOCOAGULATION WITH LASER Left 12/17/2012   Procedure: PHOTOCOAGULATION WITH LASER;  Surgeon: Hurman Horn, MD;  Location: [facility];  Service: Ophthalmology;  Laterality: Left;  . PROSTATECTOMY  2011  . RIGHT/LEFT HEART CATH AND CORONARY ANGIOGRAPHY N/A 09/01/2016   Procedure: Right/Left Heart Cath and Coronary Angiography;  Surgeon: Martinique, Peter M, MD;  Location: [facility];  Service: Cardiovascular;  Laterality: N/A;  . VASECTOMY      Current Medications: Current Meds  Medication Sig  . acetaminophen (TYLENOL) 500 MG tablet Take 500 mg by mouth daily as needed for moderate pain or headache.  Marland Kitchen aspirin 81 MG tablet Take 81 mg by mouth daily.  . calcitRIOL (ROCALTROL) 0.25 MCG capsule Take 0.25 mcg by mouth daily.  . calcium acetate (PHOSLO) 667 MG capsule Take 2 capsules (1,334 mg total) by mouth 3 (three) times daily with meals.  . carvedilol (COREG) 6.25 MG tablet Take 1 tablet (6.25 mg total) 2 (two) times daily by mouth.  . Cholecalciferol  (VITAMIN D PO) Take 1 tablet by mouth daily.  . colchicine 0.6 MG tablet Take 1 tablet (0.6 mg total) by mouth daily.  . fish oil-omega-3 fatty acids 1000 MG capsule Take 1 g by mouth daily.  . insulin detemir (LEVEMIR) 100 UNIT/ML injection Inject 0.1 mLs (10 Units total) into the skin 2 (two) times daily.  . insulin lispro (HUMALOG) 100 UNIT/ML injection Inject 7 Units into the skin 3 (three) times daily before meals.  . Magnesium 250 MG TABS Take 250 mg by mouth daily.   . Misc Natural Products (TART CHERRY ADVANCED PO) Take 1 tablet by mouth daily.   . Multiple Minerals (CALCIUM/MAGNESIUM/ZINC PO) Take 1 tablet by mouth daily.   . Multiple Vitamin (MULTIVITAMIN WITH MINERALS) TABS Take 1 tablet by mouth daily.  . rosuvastatin (CRESTOR) 20 MG tablet Take 1 tablet (  20 mg total) by mouth daily.  . traMADol (ULTRAM) 50 MG tablet Take 50 mg by mouth every 6 (six) hours as needed (pain).  . vitamin B-12 (CYANOCOBALAMIN) 1000 MCG tablet Take 1,000 mcg by mouth daily.  Marland Kitchen VITAMIN E PO Take 1 capsule by mouth daily.     Allergies:   Patient has no known allergies.   Social History   Socioeconomic History  . Marital status: Married    Spouse name: None  . Number of children: 4  . Years of education: None  . Highest education level: None  Social Needs  . Financial resource strain: None  . Food insecurity - worry: None  . Food insecurity - inability: None  . Transportation needs - medical: None  . Transportation needs - non-medical: None  Occupational History  . Occupation: Proofreader  Tobacco Use  . Smoking status: Never Smoker  . Smokeless tobacco: Never Used  Substance and Sexual Activity  . Alcohol use: No  . Drug use: No  . Sexual activity: Yes  Other Topics Concern  . None  Social History Narrative  . None     Family History:  Family History  Problem Relation Age of Onset  . Diabetes Mother   . Diabetes Father      ROS:   Please see the history of present illness.    All other systems are reviewed and otherwise negative.    PHYSICAL EXAM:   VS:  BP (!) 144/80   Pulse 95   Ht 6' (1.829 m)   Wt 242 lb (109.8 kg)   SpO2 98%   BMI 32.82 kg/m   BMI: Body mass index is 32.82 kg/m. GEN: Well nourished, well developed obese AAM, in no acute distress  HEENT: normocephalic, atraumatic Neck: no JVD, carotid bruits, or masses Cardiac: RRR; no murmurs, rubs, or gallops, no edema  Respiratory:  clear to auscultation bilaterally, normal work of breathing GI: soft, nontender, nondistended, + BS MS: no deformity or atrophy  Skin: warm and dry, no rash Neuro:  Alert and Oriented x 3, Strength and sensation are intact, follows commands Psych: euthymic mood, full affect  Wt Readings from Last 3 Encounters:  04/19/17 242 lb (109.8 kg)  03/22/17 243 lb (110.2 kg)  12/28/16 245 lb 1.9 oz (111.2 kg)      Studies/Labs Reviewed:    EKG:   EKG was not ordered today  Recent Labs: 07/26/2016: ALT 351 09/02/2016: BUN 18; Creatinine, Ser 4.06; Hemoglobin 10.0; Platelets 222; Potassium 3.8; Sodium 133   Lipid Panel    Component Value Date/Time   CHOL 171 11/25/2015 1616   TRIG 107 11/25/2015 1616   HDL 37 (L) 11/25/2015 1616   CHOLHDL 4.6 11/25/2015 1616   VLDL 21 11/25/2015 1616   LDLCALC 113 11/25/2015 1616    Additional studies/ records that were reviewed today include: Summarized above.   ASSESSMENT & PLAN:   1. Chronic systolic CHF/NICM -  1. He is on carvedilol and very low dose of Imdur 50 mg daily, his blood pressure today is borderline however very low at dialysis, increasing his blood pressure medication would jeopardize getting his hemodialysis. 2. His heart rate is elevated, therefore we will add Ivabradin 5 mg PO BID 3. He's seeing Dr. Lovena Le today for evaluation for ICD placement, it appears that subcutaneous ICD would be safer from infection standpoint as he is a dialysis patient. 2. Mild CAD - no recent angina. Continue present regimen  with aspirin, carvedilol and Crestor.  He is tolerating it well. 3. Essential HTN - as about 4. Pulmonary HTN - this has been felt 2/2 CHF. Follow up pulmonary pressures by repeat echo. 5. 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  Disposition: F/u with Dr. Meda Coffee in 3 months. Check CBC, CMP, TSH and magnesium today. Repeat echo prior to the next visit. He is seeing Dr. Lovena Le today for evaluation of ICD.   Medication Adjustments/Labs and Tests Ordered: Current medicines are reviewed at length with the patient today.  Concerns regarding medicines are outlined above. Medication changes, Labs and Tests ordered today are summarized above and listed in the Patient Instructions accessible in Encounters.   Signed, Ena Dawley, MD  04/19/2017 10:08 AM    [address], [place], [place]  [address] Phone: [phone]; Fax: [phone]

## 2017-04-20 ENCOUNTER — Telehealth: Payer: Self-pay

## 2017-04-20 DIAGNOSIS — N2581 Secondary hyperparathyroidism of renal origin: Secondary | ICD-10-CM | POA: Diagnosis not present

## 2017-04-20 DIAGNOSIS — N186 End stage renal disease: Secondary | ICD-10-CM | POA: Diagnosis not present

## 2017-04-20 NOTE — Telephone Encounter (Signed)
**Note De-Identified Esai Stecklein Obfuscation** I have done a Corlanor PA through covermymeds.

## 2017-04-20 NOTE — Addendum Note (Signed)
Addended by: Marlis Edelson C on: 04/20/2017 11:35 AM   Modules accepted: Orders

## 2017-04-23 DIAGNOSIS — N2581 Secondary hyperparathyroidism of renal origin: Secondary | ICD-10-CM | POA: Diagnosis not present

## 2017-04-23 DIAGNOSIS — N186 End stage renal disease: Secondary | ICD-10-CM | POA: Diagnosis not present

## 2017-04-23 NOTE — Telephone Encounter (Signed)
P.A. Isabelle Course, went thru [employer] for $15, pt picked up

## 2017-04-25 DIAGNOSIS — N2581 Secondary hyperparathyroidism of renal origin: Secondary | ICD-10-CM | POA: Diagnosis not present

## 2017-04-25 DIAGNOSIS — N186 End stage renal disease: Secondary | ICD-10-CM | POA: Diagnosis not present

## 2017-04-25 LAB — T4, FREE: Free T4: 1.38 ng/dL (ref 0.82–1.77)

## 2017-04-25 LAB — SPECIMEN STATUS REPORT

## 2017-04-25 LAB — T3, FREE: T3, Free: 2.5 pg/mL (ref 2.0–4.4)

## 2017-04-26 ENCOUNTER — Ambulatory Visit (INDEPENDENT_AMBULATORY_CARE_PROVIDER_SITE_OTHER): Payer: BLUE CROSS/BLUE SHIELD | Admitting: Family Medicine

## 2017-04-26 ENCOUNTER — Encounter: Payer: Self-pay | Admitting: Family Medicine

## 2017-04-26 VITALS — BP 130/70 | HR 100 | Temp 98.7°F | Resp 16 | Wt 244.4 lb

## 2017-04-26 DIAGNOSIS — R058 Other specified cough: Secondary | ICD-10-CM

## 2017-04-26 DIAGNOSIS — R05 Cough: Secondary | ICD-10-CM | POA: Diagnosis not present

## 2017-04-26 DIAGNOSIS — J0140 Acute pansinusitis, unspecified: Secondary | ICD-10-CM | POA: Diagnosis not present

## 2017-04-26 MED ORDER — AMOXICILLIN-POT CLAVULANATE 500-125 MG PO TABS: 1.0000 | ORAL_TABLET | ORAL | 0 refills | Status: DC

## 2017-04-26 NOTE — Progress Notes (Signed)
Chief Complaint  Patient presents with  . chest congestion    chest congestion, cough, been going on for 2 weeks   Subjective:  Austin Kramer is a 57 y.o. male who presents for a 2 week history of rhinorrhea, purulent drainage, sinus pressure, and productive cough. He reports gradual improvement since last week but still has fatigue.   Hemodialysis on M, W, F  Denies fever, chills, body aches, ear pain, sore throat, chest pain, palpitations, shortness of breath, wheezing, abdominal pain, N/V/D.   Treatment to date: cough suppressants and decongestants.  Denies sick contacts.  No other aggravating or relieving factors.  No other c/o.  ROS as in subjective.   Objective: Vitals:   04/26/17 1557  BP: 130/70  Pulse: 100  Resp: 16  Temp: 98.7 F (37.1 C)  SpO2: 99%    General appearance: Alert, WD/WN, no distress, mildly ill appearing                             Skin: warm, no rash                           Head: + maxillary and ethmoid sinus tenderness                            Eyes: conjunctiva normal, corneas clear, PERRLA                            Ears: pearly TMs with clear fluid behind R TM, external ear canals normal                          Nose: septum midline, turbinates swollen, with erythema and thick discharge             Mouth/throat: MMM, tongue normal, mild pharyngeal erythema without edema or exudate                           Neck: supple, no adenopathy, no thyromegaly, nontender                          Heart: RRR, normal S1, S2, no murmurs                         Lungs: CTA bilaterally, scattered distant wheezes in upper lobes, no rales, or rhonchi      Assessment: Acute non-recurrent pansinusitis - Plan: amoxicillin-clavulanate (AUGMENTIN) 500-125 MG tablet  Cough productive of yellow sputum    Plan: Discussed diagnosis and treatment of acute sinusitis and cough. Augmentin prescribed and the dose for HD was used. He will alert the dialysis center  tomorrow that he is taking this.  Suggested symptomatic OTC remedies. May try a steroid nasal spray and nasal saline spray for congestion.  Tylenol OTC for fever and malaise.  Call/return if not back to baseline after completing the antibiotic or if his symptoms worsen.

## 2017-04-26 NOTE — Telephone Encounter (Signed)
Received fax from [facility] with approval for Patients CORLANOR dates 04/19/2017-04/20/2018. [place] notified.

## 2017-04-26 NOTE — Patient Instructions (Signed)
You may want to try a steroid nasal spray such as Nasocort, Flonase  Take the antibiotic once daily as prescribed and let the dialysis center know that you are taking this when you go tomorrow.   Call or return if you are not back to baseline after completing the antibiotic.

## 2017-04-27 DIAGNOSIS — N186 End stage renal disease: Secondary | ICD-10-CM | POA: Diagnosis not present

## 2017-04-27 DIAGNOSIS — N2581 Secondary hyperparathyroidism of renal origin: Secondary | ICD-10-CM | POA: Diagnosis not present

## 2017-04-30 DIAGNOSIS — N2581 Secondary hyperparathyroidism of renal origin: Secondary | ICD-10-CM | POA: Diagnosis not present

## 2017-04-30 DIAGNOSIS — N186 End stage renal disease: Secondary | ICD-10-CM | POA: Diagnosis not present

## 2017-05-02 DIAGNOSIS — N2581 Secondary hyperparathyroidism of renal origin: Secondary | ICD-10-CM | POA: Diagnosis not present

## 2017-05-02 DIAGNOSIS — N186 End stage renal disease: Secondary | ICD-10-CM | POA: Diagnosis not present

## 2017-05-03 DIAGNOSIS — E1122 Type 2 diabetes mellitus with diabetic chronic kidney disease: Secondary | ICD-10-CM | POA: Diagnosis not present

## 2017-05-03 DIAGNOSIS — I428 Other cardiomyopathies: Secondary | ICD-10-CM | POA: Diagnosis not present

## 2017-05-03 DIAGNOSIS — Z992 Dependence on renal dialysis: Secondary | ICD-10-CM | POA: Diagnosis not present

## 2017-05-03 DIAGNOSIS — I4581 Long QT syndrome: Secondary | ICD-10-CM | POA: Diagnosis not present

## 2017-05-03 DIAGNOSIS — Z01818 Encounter for other preprocedural examination: Secondary | ICD-10-CM | POA: Diagnosis not present

## 2017-05-03 DIAGNOSIS — I447 Left bundle-branch block, unspecified: Secondary | ICD-10-CM | POA: Diagnosis not present

## 2017-05-03 DIAGNOSIS — N186 End stage renal disease: Secondary | ICD-10-CM | POA: Diagnosis not present

## 2017-05-03 DIAGNOSIS — I5020 Unspecified systolic (congestive) heart failure: Secondary | ICD-10-CM | POA: Diagnosis not present

## 2017-05-03 DIAGNOSIS — I493 Ventricular premature depolarization: Secondary | ICD-10-CM | POA: Diagnosis not present

## 2017-05-03 DIAGNOSIS — I272 Pulmonary hypertension, unspecified: Secondary | ICD-10-CM | POA: Diagnosis not present

## 2017-05-04 DIAGNOSIS — N2581 Secondary hyperparathyroidism of renal origin: Secondary | ICD-10-CM | POA: Diagnosis not present

## 2017-05-04 DIAGNOSIS — N186 End stage renal disease: Secondary | ICD-10-CM | POA: Diagnosis not present

## 2017-05-07 DIAGNOSIS — N2581 Secondary hyperparathyroidism of renal origin: Secondary | ICD-10-CM | POA: Diagnosis not present

## 2017-05-07 DIAGNOSIS — N186 End stage renal disease: Secondary | ICD-10-CM | POA: Diagnosis not present

## 2017-05-09 DIAGNOSIS — N2581 Secondary hyperparathyroidism of renal origin: Secondary | ICD-10-CM | POA: Diagnosis not present

## 2017-05-09 DIAGNOSIS — N186 End stage renal disease: Secondary | ICD-10-CM | POA: Diagnosis not present

## 2017-05-11 ENCOUNTER — Telehealth: Payer: Self-pay | Admitting: Family Medicine

## 2017-05-11 DIAGNOSIS — N2581 Secondary hyperparathyroidism of renal origin: Secondary | ICD-10-CM | POA: Diagnosis not present

## 2017-05-11 DIAGNOSIS — J0140 Acute pansinusitis, unspecified: Secondary | ICD-10-CM

## 2017-05-11 DIAGNOSIS — N186 End stage renal disease: Secondary | ICD-10-CM | POA: Diagnosis not present

## 2017-05-11 MED ORDER — AMOXICILLIN-POT CLAVULANATE 500-125 MG PO TABS: 1.0000 | ORAL_TABLET | ORAL | 0 refills | Status: DC

## 2017-05-11 NOTE — Telephone Encounter (Signed)
Please call and find out how much better he got after finishing the antibiotic.  We can repeat the round of Augmentin with the dialysis dosing and then if he is not back to baseline after finishing it, have him come in so we can reevaluate him.

## 2017-05-11 NOTE — Telephone Encounter (Signed)
Tried to call pt but vm is full 

## 2017-05-11 NOTE — Telephone Encounter (Signed)
Pt called and stated that he has finished medication. He states that symptoms did get better but now are returning. Please advise pt at [phone]. Pt uses [address]

## 2017-05-11 NOTE — Telephone Encounter (Signed)
Pt feels he is better but hasn't fully better and thinks another round will be good Sent med to pharmacy

## 2017-05-13 DIAGNOSIS — E1129 Type 2 diabetes mellitus with other diabetic kidney complication: Secondary | ICD-10-CM | POA: Diagnosis not present

## 2017-05-13 DIAGNOSIS — N186 End stage renal disease: Secondary | ICD-10-CM | POA: Diagnosis not present

## 2017-05-13 DIAGNOSIS — Z992 Dependence on renal dialysis: Secondary | ICD-10-CM | POA: Diagnosis not present

## 2017-05-14 DIAGNOSIS — N2581 Secondary hyperparathyroidism of renal origin: Secondary | ICD-10-CM | POA: Diagnosis not present

## 2017-05-14 DIAGNOSIS — N186 End stage renal disease: Secondary | ICD-10-CM | POA: Diagnosis not present

## 2017-05-16 DIAGNOSIS — N2581 Secondary hyperparathyroidism of renal origin: Secondary | ICD-10-CM | POA: Diagnosis not present

## 2017-05-16 DIAGNOSIS — N186 End stage renal disease: Secondary | ICD-10-CM | POA: Diagnosis not present

## 2017-05-18 DIAGNOSIS — N186 End stage renal disease: Secondary | ICD-10-CM | POA: Diagnosis not present

## 2017-05-18 DIAGNOSIS — N2581 Secondary hyperparathyroidism of renal origin: Secondary | ICD-10-CM | POA: Diagnosis not present

## 2017-05-21 ENCOUNTER — Telehealth: Payer: Self-pay | Admitting: Internal Medicine

## 2017-05-21 NOTE — Telephone Encounter (Signed)
New Message   Patient states that he is to have an procedure done by Dr. Lovena Le. He would like to see about scheduling the appointment. Please call to discuss.

## 2017-05-22 DIAGNOSIS — Z992 Dependence on renal dialysis: Secondary | ICD-10-CM | POA: Diagnosis not present

## 2017-05-22 DIAGNOSIS — I871 Compression of vein: Secondary | ICD-10-CM | POA: Diagnosis not present

## 2017-05-22 DIAGNOSIS — N186 End stage renal disease: Secondary | ICD-10-CM | POA: Diagnosis not present

## 2017-05-22 DIAGNOSIS — T82868A Thrombosis of vascular prosthetic devices, implants and grafts, initial encounter: Secondary | ICD-10-CM | POA: Diagnosis not present

## 2017-05-23 ENCOUNTER — Telehealth: Payer: Self-pay | Admitting: Internal Medicine

## 2017-05-23 DIAGNOSIS — N2581 Secondary hyperparathyroidism of renal origin: Secondary | ICD-10-CM | POA: Diagnosis not present

## 2017-05-23 DIAGNOSIS — L299 Pruritus, unspecified: Secondary | ICD-10-CM | POA: Diagnosis not present

## 2017-05-23 DIAGNOSIS — N186 End stage renal disease: Secondary | ICD-10-CM | POA: Diagnosis not present

## 2017-05-23 DIAGNOSIS — R52 Pain, unspecified: Secondary | ICD-10-CM | POA: Diagnosis not present

## 2017-05-23 DIAGNOSIS — I5022 Chronic systolic (congestive) heart failure: Secondary | ICD-10-CM

## 2017-05-23 NOTE — Telephone Encounter (Signed)
New Message   Patient is calling to see about scheduling his surgery. Please call to discuss.

## 2017-05-24 NOTE — Telephone Encounter (Signed)
Pt scheduled for sub cut ICD on Jun 18 2017 @ 9:30 am.  Pt will come in for labs and instruction letter on June 12, 2017. No further action at this time.

## 2017-05-24 NOTE — Telephone Encounter (Signed)
Outreach made to Pt.  Need to schedule subcut ICD. Left this nurse name and # for call back.

## 2017-05-25 DIAGNOSIS — N186 End stage renal disease: Secondary | ICD-10-CM | POA: Diagnosis not present

## 2017-05-25 DIAGNOSIS — L299 Pruritus, unspecified: Secondary | ICD-10-CM | POA: Diagnosis not present

## 2017-05-25 DIAGNOSIS — N2581 Secondary hyperparathyroidism of renal origin: Secondary | ICD-10-CM | POA: Diagnosis not present

## 2017-05-25 DIAGNOSIS — R52 Pain, unspecified: Secondary | ICD-10-CM | POA: Diagnosis not present

## 2017-05-28 DIAGNOSIS — N2581 Secondary hyperparathyroidism of renal origin: Secondary | ICD-10-CM | POA: Diagnosis not present

## 2017-05-28 DIAGNOSIS — N186 End stage renal disease: Secondary | ICD-10-CM | POA: Diagnosis not present

## 2017-05-30 ENCOUNTER — Telehealth: Payer: Self-pay

## 2017-05-30 DIAGNOSIS — N2581 Secondary hyperparathyroidism of renal origin: Secondary | ICD-10-CM | POA: Diagnosis not present

## 2017-05-30 DIAGNOSIS — N186 End stage renal disease: Secondary | ICD-10-CM | POA: Diagnosis not present

## 2017-05-30 NOTE — Telephone Encounter (Signed)
CVM left per DPR.  Notified Pt that his procedure had to be moved to May 9 @ 11:30 am d/t no anesthesia available on Mondays. Requested Pt call this nurse back to confirm.

## 2017-06-01 DIAGNOSIS — N186 End stage renal disease: Secondary | ICD-10-CM | POA: Diagnosis not present

## 2017-06-01 DIAGNOSIS — N2581 Secondary hyperparathyroidism of renal origin: Secondary | ICD-10-CM | POA: Diagnosis not present

## 2017-06-04 DIAGNOSIS — N2581 Secondary hyperparathyroidism of renal origin: Secondary | ICD-10-CM | POA: Diagnosis not present

## 2017-06-04 DIAGNOSIS — N186 End stage renal disease: Secondary | ICD-10-CM | POA: Diagnosis not present

## 2017-06-05 ENCOUNTER — Telehealth: Payer: Self-pay | Admitting: Internal Medicine

## 2017-06-05 NOTE — Telephone Encounter (Signed)
New Message:      Pt is calling due to needing to get paper work done for his leave of absence since the date was changed for procedure.

## 2017-06-05 NOTE — Telephone Encounter (Signed)
Spoke with wife.  Confirmed Pt change of surgical date/time to Jun 21, 2017 @ 11:30 am.   Pt wife indicates understanding.  June 12, 2017 still ok for lab work.

## 2017-06-06 DIAGNOSIS — N186 End stage renal disease: Secondary | ICD-10-CM | POA: Diagnosis not present

## 2017-06-06 DIAGNOSIS — N2581 Secondary hyperparathyroidism of renal origin: Secondary | ICD-10-CM | POA: Diagnosis not present

## 2017-06-06 NOTE — Telephone Encounter (Signed)
Left message requesting call back.  Need addl information to create letter for Pt.

## 2017-06-06 NOTE — Telephone Encounter (Signed)
Pt returned this nurse phone call.  Pt work questioning change of surgical dates.  Pt will determine what action this nurse needs to take and return call.  Gave phone number and fax #. Await further needs.

## 2017-06-08 DIAGNOSIS — N186 End stage renal disease: Secondary | ICD-10-CM | POA: Diagnosis not present

## 2017-06-08 DIAGNOSIS — N2581 Secondary hyperparathyroidism of renal origin: Secondary | ICD-10-CM | POA: Diagnosis not present

## 2017-06-11 DIAGNOSIS — N2581 Secondary hyperparathyroidism of renal origin: Secondary | ICD-10-CM | POA: Diagnosis not present

## 2017-06-11 DIAGNOSIS — N186 End stage renal disease: Secondary | ICD-10-CM | POA: Diagnosis not present

## 2017-06-12 ENCOUNTER — Telehealth: Payer: Self-pay | Admitting: Internal Medicine

## 2017-06-12 ENCOUNTER — Other Ambulatory Visit: Payer: BLUE CROSS/BLUE SHIELD

## 2017-06-12 DIAGNOSIS — I5022 Chronic systolic (congestive) heart failure: Secondary | ICD-10-CM | POA: Diagnosis not present

## 2017-06-12 DIAGNOSIS — N186 End stage renal disease: Secondary | ICD-10-CM | POA: Diagnosis not present

## 2017-06-12 DIAGNOSIS — E1129 Type 2 diabetes mellitus with other diabetic kidney complication: Secondary | ICD-10-CM | POA: Diagnosis not present

## 2017-06-12 DIAGNOSIS — Z992 Dependence on renal dialysis: Secondary | ICD-10-CM | POA: Diagnosis not present

## 2017-06-12 NOTE — Telephone Encounter (Signed)
Pt at [facility] for labs.  Pt asking if FMLA paperwork had been received yet.  Notified Pt it had not been received.  Pt will call his employer to have refaxed.  Pt wants off from May 9-28 for his FMLA.   Will cont to monitor.

## 2017-06-12 NOTE — Telephone Encounter (Signed)
Follow Up:; ° ° °Returning your call. °

## 2017-06-12 NOTE — Telephone Encounter (Signed)
Attempted outreach to Pt.  This nurse DID NOT attempt to call Pt. Unable to leave VM d/t VM full.  If Pt returns call advised [facility] preadmissions probably attempting to call him.  No action needed.

## 2017-06-12 NOTE — Telephone Encounter (Signed)
New Message:    Pt said to please call him,he needs to talk to you about his FMLA pape work.

## 2017-06-12 NOTE — Telephone Encounter (Signed)
No further action needed at this time.

## 2017-06-13 ENCOUNTER — Other Ambulatory Visit: Payer: Self-pay | Admitting: Family Medicine

## 2017-06-13 DIAGNOSIS — J0140 Acute pansinusitis, unspecified: Secondary | ICD-10-CM

## 2017-06-13 LAB — CBC WITH DIFFERENTIAL/PLATELET
BASOS: 0 %
Basophils Absolute: 0 10*3/uL (ref 0.0–0.2)
EOS (ABSOLUTE): 0.2 10*3/uL (ref 0.0–0.4)
EOS: 3 %
HEMOGLOBIN: 11.1 g/dL — AB (ref 13.0–17.7)
Hematocrit: 34.8 % — ABNORMAL LOW (ref 37.5–51.0)
IMMATURE GRANS (ABS): 0 10*3/uL (ref 0.0–0.1)
IMMATURE GRANULOCYTES: 0 %
Lymphocytes Absolute: 1.3 10*3/uL (ref 0.7–3.1)
Lymphs: 17 %
MCH: 26.7 pg (ref 26.6–33.0)
MCHC: 31.9 g/dL (ref 31.5–35.7)
MCV: 84 fL (ref 79–97)
MONOCYTES: 7 %
Monocytes Absolute: 0.5 10*3/uL (ref 0.1–0.9)
NEUTROS PCT: 73 %
Neutrophils Absolute: 5.4 10*3/uL (ref 1.4–7.0)
Platelets: 247 10*3/uL (ref 150–379)
RBC: 4.16 x10E6/uL (ref 4.14–5.80)
RDW: 19.2 % — ABNORMAL HIGH (ref 12.3–15.4)
WBC: 7.4 10*3/uL (ref 3.4–10.8)

## 2017-06-13 LAB — BASIC METABOLIC PANEL
BUN/Creatinine Ratio: 6 — ABNORMAL LOW (ref 9–20)
BUN: 40 mg/dL — AB (ref 6–24)
CO2: 26 mmol/L (ref 20–29)
CREATININE: 7.1 mg/dL — AB (ref 0.76–1.27)
Calcium: 9.2 mg/dL (ref 8.7–10.2)
Chloride: 94 mmol/L — ABNORMAL LOW (ref 96–106)
GFR calc Af Amer: 9 mL/min/{1.73_m2} — ABNORMAL LOW (ref >59)
GFR, EST NON AFRICAN AMERICAN: 8 mL/min/{1.73_m2} — AB (ref >59)
Glucose: 154 mg/dL — ABNORMAL HIGH (ref 65–99)
Potassium: 4.3 mmol/L (ref 3.5–5.2)
SODIUM: 140 mmol/L (ref 134–144)

## 2017-06-14 DIAGNOSIS — E113512 Type 2 diabetes mellitus with proliferative diabetic retinopathy with macular edema, left eye: Secondary | ICD-10-CM | POA: Diagnosis not present

## 2017-06-14 NOTE — Telephone Encounter (Signed)
Paperwork received.  Work up in process.  No further action needed at this time.

## 2017-06-15 ENCOUNTER — Telehealth: Payer: Self-pay

## 2017-06-15 DIAGNOSIS — N2581 Secondary hyperparathyroidism of renal origin: Secondary | ICD-10-CM | POA: Diagnosis not present

## 2017-06-15 DIAGNOSIS — N186 End stage renal disease: Secondary | ICD-10-CM | POA: Diagnosis not present

## 2017-06-15 NOTE — Telephone Encounter (Signed)
error 

## 2017-06-18 DIAGNOSIS — N2581 Secondary hyperparathyroidism of renal origin: Secondary | ICD-10-CM | POA: Diagnosis not present

## 2017-06-18 DIAGNOSIS — N186 End stage renal disease: Secondary | ICD-10-CM | POA: Diagnosis not present

## 2017-06-20 DIAGNOSIS — N186 End stage renal disease: Secondary | ICD-10-CM | POA: Diagnosis not present

## 2017-06-20 DIAGNOSIS — N2581 Secondary hyperparathyroidism of renal origin: Secondary | ICD-10-CM | POA: Diagnosis not present

## 2017-06-21 ENCOUNTER — Ambulatory Visit (HOSPITAL_COMMUNITY)
Admission: RE | Admit: 2017-06-21 | Discharge: 2017-06-22 | Disposition: A | Discharge status: Home / Self Care | Payer: BLUE CROSS/BLUE SHIELD | Source: Ambulatory Visit | Attending: Internal Medicine | Admitting: Internal Medicine

## 2017-06-21 ENCOUNTER — Ambulatory Visit (HOSPITAL_COMMUNITY): Payer: BLUE CROSS/BLUE SHIELD | Admitting: Anesthesiology

## 2017-06-21 ENCOUNTER — Encounter (HOSPITAL_COMMUNITY)
Admission: RE | Disposition: A | Discharge status: Home / Self Care | Payer: Self-pay | Source: Ambulatory Visit | Attending: Internal Medicine

## 2017-06-21 DIAGNOSIS — I2510 Atherosclerotic heart disease of native coronary artery without angina pectoris: Secondary | ICD-10-CM | POA: Insufficient documentation

## 2017-06-21 DIAGNOSIS — Z794 Long term (current) use of insulin: Secondary | ICD-10-CM | POA: Diagnosis not present

## 2017-06-21 DIAGNOSIS — I132 Hypertensive heart and chronic kidney disease with heart failure and with stage 5 chronic kidney disease, or end stage renal disease: Secondary | ICD-10-CM | POA: Insufficient documentation

## 2017-06-21 DIAGNOSIS — Z992 Dependence on renal dialysis: Secondary | ICD-10-CM | POA: Insufficient documentation

## 2017-06-21 DIAGNOSIS — K219 Gastro-esophageal reflux disease without esophagitis: Secondary | ICD-10-CM | POA: Insufficient documentation

## 2017-06-21 DIAGNOSIS — I1311 Hypertensive heart and chronic kidney disease without heart failure, with stage 5 chronic kidney disease, or end stage renal disease: Secondary | ICD-10-CM | POA: Diagnosis not present

## 2017-06-21 DIAGNOSIS — I429 Cardiomyopathy, unspecified: Secondary | ICD-10-CM | POA: Insufficient documentation

## 2017-06-21 DIAGNOSIS — N186 End stage renal disease: Secondary | ICD-10-CM | POA: Diagnosis not present

## 2017-06-21 DIAGNOSIS — Z7982 Long term (current) use of aspirin: Secondary | ICD-10-CM | POA: Insufficient documentation

## 2017-06-21 DIAGNOSIS — I5022 Chronic systolic (congestive) heart failure: Secondary | ICD-10-CM | POA: Diagnosis present

## 2017-06-21 DIAGNOSIS — E1122 Type 2 diabetes mellitus with diabetic chronic kidney disease: Secondary | ICD-10-CM | POA: Insufficient documentation

## 2017-06-21 DIAGNOSIS — Z79899 Other long term (current) drug therapy: Secondary | ICD-10-CM | POA: Insufficient documentation

## 2017-06-21 HISTORY — DX: Chronic systolic (congestive) heart failure: I50.22

## 2017-06-21 HISTORY — PX: SUBQ ICD IMPLANT: EP1223

## 2017-06-21 LAB — GLUCOSE, CAPILLARY
GLUCOSE-CAPILLARY: 101 mg/dL — AB (ref 65–99)
GLUCOSE-CAPILLARY: 92 mg/dL (ref 65–99)
Glucose-Capillary: 79 mg/dL (ref 65–99)
Glucose-Capillary: 104 mg/dL — ABNORMAL HIGH (ref 65–99)
Glucose-Capillary: 65 mg/dL (ref 65–99)
Glucose-Capillary: 80 mg/dL (ref 65–99)
Glucose-Capillary: 62 mg/dL — ABNORMAL LOW (ref 65–99)
Glucose-Capillary: 476 mg/dL — ABNORMAL HIGH (ref 65–99)

## 2017-06-21 LAB — SURGICAL PCR SCREEN
MRSA, PCR: NEGATIVE
STAPHYLOCOCCUS AUREUS: NEGATIVE

## 2017-06-21 SURGERY — SUBQ ICD IMPLANT
Anesthesia: General

## 2017-06-21 MED ORDER — MUPIROCIN 2 % EX OINT
TOPICAL_OINTMENT | Freq: Once | CUTANEOUS | Status: AC
  Administered 2017-06-21: 1 via NASAL
  Filled 2017-06-21: qty 22

## 2017-06-21 MED ORDER — ONDANSETRON HCL 4 MG/2ML IJ SOLN
4.0000 mg | Freq: Four times a day (QID) | INTRAMUSCULAR | Status: DC | PRN
  Administered 2017-06-21: 4 mg via INTRAVENOUS

## 2017-06-21 MED ORDER — DEXAMETHASONE SODIUM PHOSPHATE 10 MG/ML IJ SOLN
INTRAMUSCULAR | Status: DC | PRN
  Administered 2017-06-21: 10 mg via INTRAVENOUS

## 2017-06-21 MED ORDER — BUPIVACAINE HCL (PF) 0.25 % IJ SOLN
INTRAMUSCULAR | Status: DC | PRN
  Administered 2017-06-21: 120 mL

## 2017-06-21 MED ORDER — LIDOCAINE HCL (CARDIAC) PF 100 MG/5ML IV SOSY: PREFILLED_SYRINGE | INTRAVENOUS | Status: DC | PRN

## 2017-06-21 MED ORDER — HYDROMORPHONE HCL 1 MG/ML IJ SOLN
INTRAMUSCULAR | Status: AC
Start: 2017-06-21 — End: ?
  Filled 2017-06-21: qty 0.5

## 2017-06-21 MED ORDER — DEXTROSE 50 % IV SOLN
INTRAVENOUS | Status: AC
  Filled 2017-06-21: qty 50

## 2017-06-21 MED ORDER — SODIUM CHLORIDE 0.9 % IV SOLN
INTRAVENOUS | Status: DC | PRN
  Administered 2017-06-21: 500 mL

## 2017-06-21 MED ORDER — ONDANSETRON HCL 4 MG/2ML IJ SOLN
INTRAMUSCULAR | Status: DC | PRN
  Administered 2017-06-21: 4 mg via INTRAVENOUS

## 2017-06-21 MED ORDER — LIDOCAINE 2% (20 MG/ML) 5 ML SYRINGE
INTRAMUSCULAR | Status: DC | PRN
  Administered 2017-06-21: 100 mg via INTRAVENOUS

## 2017-06-21 MED ORDER — PROPOFOL 10 MG/ML IV BOLUS
INTRAVENOUS | Status: DC | PRN
  Administered 2017-06-21: 120 mg via INTRAVENOUS

## 2017-06-21 MED ORDER — ONDANSETRON HCL 4 MG/2ML IJ SOLN
INTRAMUSCULAR | Status: AC
  Filled 2017-06-21: qty 2

## 2017-06-21 MED ORDER — CEFAZOLIN SODIUM-DEXTROSE 2-4 GM/100ML-% IV SOLN
INTRAVENOUS | Status: AC
  Filled 2017-06-21: qty 100

## 2017-06-21 MED ORDER — CEFAZOLIN SODIUM-DEXTROSE 2-4 GM/100ML-% IV SOLN
2.0000 g | Freq: Three times a day (TID) | INTRAVENOUS | Status: DC
  Administered 2017-06-21 – 2017-06-22 (×2): 2 g via INTRAVENOUS
  Filled 2017-06-21 (×3): qty 100

## 2017-06-21 MED ORDER — ACETAMINOPHEN 325 MG PO TABS
325.0000 mg | ORAL_TABLET | ORAL | Status: DC | PRN
  Administered 2017-06-22 (×2): 650 mg via ORAL
  Filled 2017-06-21 (×2): qty 2

## 2017-06-21 MED ORDER — CEFAZOLIN SODIUM-DEXTROSE 2-4 GM/100ML-% IV SOLN
2.0000 g | INTRAVENOUS | Status: AC
  Administered 2017-06-21 (×2): 2 g via INTRAVENOUS

## 2017-06-21 MED ORDER — FENTANYL CITRATE (PF) 100 MCG/2ML IJ SOLN: 25.0000 ug | INTRAMUSCULAR | Status: DC | PRN

## 2017-06-21 MED ORDER — SODIUM CHLORIDE 0.9 % IV SOLN
80.0000 mg | INTRAVENOUS | Status: AC
  Administered 2017-06-21: 80 mg

## 2017-06-21 MED ORDER — MIDAZOLAM HCL 5 MG/5ML IJ SOLN
INTRAMUSCULAR | Status: DC | PRN
  Administered 2017-06-21 (×2): 2 mg via INTRAVENOUS

## 2017-06-21 MED ORDER — SODIUM CHLORIDE 0.9 % IV SOLN
INTRAVENOUS | Status: DC
  Administered 2017-06-21: 10:00:00 via INTRAVENOUS

## 2017-06-21 MED ORDER — INSULIN ASPART 100 UNIT/ML ~~LOC~~ SOLN
0.0000 [IU] | Freq: Every day | SUBCUTANEOUS | Status: DC
  Administered 2017-06-21: 5 [IU] via SUBCUTANEOUS

## 2017-06-21 MED ORDER — HYDROMORPHONE HCL 1 MG/ML IJ SOLN
0.2500 mg | INTRAMUSCULAR | Status: DC | PRN
  Administered 2017-06-21: 0.5 mg via INTRAVENOUS

## 2017-06-21 MED ORDER — MUPIROCIN 2 % EX OINT
TOPICAL_OINTMENT | CUTANEOUS | Status: AC
  Filled 2017-06-21: qty 22

## 2017-06-21 MED ORDER — ARTIFICIAL TEARS OPHTHALMIC OINT
TOPICAL_OINTMENT | OPHTHALMIC | Status: DC | PRN
  Administered 2017-06-21: 1 via OPHTHALMIC

## 2017-06-21 MED ORDER — PHENYLEPHRINE HCL 10 MG/ML IJ SOLN
INTRAVENOUS | Status: DC | PRN
  Administered 2017-06-21: 30 ug/min via INTRAVENOUS

## 2017-06-21 MED ORDER — INSULIN ASPART 100 UNIT/ML ~~LOC~~ SOLN
0.0000 [IU] | Freq: Three times a day (TID) | SUBCUTANEOUS | Status: DC
  Administered 2017-06-22: 5 [IU] via SUBCUTANEOUS

## 2017-06-21 MED ORDER — HEPARIN (PORCINE) IN NACL 2-0.9 UNITS/ML
INTRAMUSCULAR | Status: AC | PRN
  Administered 2017-06-21: 500 mL

## 2017-06-21 MED ORDER — HEPARIN (PORCINE) IN NACL 1000-0.9 UT/500ML-% IV SOLN
INTRAVENOUS | Status: AC
  Filled 2017-06-21: qty 500

## 2017-06-21 MED ORDER — DEXTROSE 50 % IV SOLN
25.0000 g | Freq: Once | INTRAVENOUS | Status: AC
  Administered 2017-06-21: 25 g via INTRAVENOUS

## 2017-06-21 MED ORDER — BUPIVACAINE HCL (PF) 0.25 % IJ SOLN
INTRAMUSCULAR | Status: AC
  Filled 2017-06-21: qty 120

## 2017-06-21 MED ORDER — SODIUM CHLORIDE 0.9 % IV SOLN
INTRAVENOUS | Status: AC
  Filled 2017-06-21: qty 2

## 2017-06-21 MED ORDER — FENTANYL CITRATE (PF) 100 MCG/2ML IJ SOLN
INTRAMUSCULAR | Status: DC | PRN
  Administered 2017-06-21: 50 ug via INTRAVENOUS
  Administered 2017-06-21 (×2): 25 ug via INTRAVENOUS

## 2017-06-21 MED ORDER — CHLORHEXIDINE GLUCONATE 4 % EX LIQD
60.0000 mL | Freq: Once | CUTANEOUS | Status: DC
  Filled 2017-06-21: qty 60

## 2017-06-21 SURGICAL SUPPLY — 4 items
ICD SUBCU MRI EMBLEM A219 (ICD Generator) ×3 IMPLANT
LEAD SUBQU EMBLEM 3501 (Pacemaker) ×3 IMPLANT
PAD DEFIB LIFELINK (PAD) ×6 IMPLANT
TRAY PACEMAKER INSERTION (PACKS) ×6 IMPLANT

## 2017-06-21 NOTE — Anesthesia Postprocedure Evaluation (Signed)
Anesthesia Post Note  Patient: Austin Kramer  Procedure(s) Performed: SUBQ ICD IMPLANT (N/A )     Patient location during evaluation: PACU Anesthesia Type: General Level of consciousness: awake and alert Pain management: pain level controlled Vital Signs Assessment: post-procedure vital signs reviewed and stable Respiratory status: spontaneous breathing, nonlabored ventilation, respiratory function stable and patient connected to nasal cannula oxygen Cardiovascular status: blood pressure returned to baseline and stable Postop Assessment: no apparent nausea or vomiting Anesthetic complications: no    Last Vitals:  Vitals:   06/21/17 1659 06/21/17 1713  BP:    Pulse:    Resp:  20  Temp: 36.4 C   SpO2:      Last Pain:  Vitals:   06/21/17 1659  TempSrc: Oral  PainSc:                  Rithy Mandley DAVID

## 2017-06-21 NOTE — Anesthesia Preprocedure Evaluation (Addendum)
Anesthesia Evaluation  Patient identified by MRN, date of birth, ID band Patient awake    Reviewed: Allergy & Precautions, NPO status , Patient's Chart, lab work & pertinent test results  Airway Mallampati: II  TM Distance: >3 FB Neck ROM: Full    Dental   Pulmonary    Pulmonary exam normal        Cardiovascular hypertension, Pt. on medications + CAD and +CHF  Normal cardiovascular exam     Neuro/Psych    GI/Hepatic GERD  Medicated and Controlled,  Endo/Other  diabetes, Type 2, Oral Hypoglycemic Agents  Renal/GU ESRF and DialysisRenal disease     Musculoskeletal   Abdominal   Peds  Hematology   Anesthesia Other Findings   Reproductive/Obstetrics                             Anesthesia Physical Anesthesia Plan  ASA: III  Anesthesia Plan: MAC   Post-op Pain Management:    Induction: Intravenous  PONV Risk Score and Plan: 2 and Ondansetron and Treatment may vary due to age or medical condition  Airway Management Planned: Simple Face Mask  Additional Equipment:   Intra-op Plan:   Post-operative Plan:   Informed Consent: I have reviewed the patients History and Physical, chart, labs and discussed the procedure including the risks, benefits and alternatives for the proposed anesthesia with the patient or authorized representative who has indicated his/her understanding and acceptance.     Plan Discussed with: CRNA and Surgeon  Anesthesia Plan Comments:        Anesthesia Quick Evaluation

## 2017-06-21 NOTE — Anesthesia Procedure Notes (Signed)
Procedure Name: LMA Insertion Date/Time: 06/21/2017 1:21 PM Performed by: Lance Coon, CRNA Pre-anesthesia Checklist: Patient identified, Emergency Drugs available, Suction available and Patient being monitored Patient Re-evaluated:Patient Re-evaluated prior to induction Oxygen Delivery Method: Circle System Utilized Preoxygenation: Pre-oxygenation with 100% oxygen Induction Type: IV induction Ventilation: Mask ventilation without difficulty LMA: LMA inserted LMA Size: 5.0 Number of attempts: 1 Airway Equipment and Method: Bite block Placement Confirmation: positive ETCO2 Tube secured with: Tape Dental Injury: Teeth and Oropharynx as per pre-operative assessment

## 2017-06-21 NOTE — Progress Notes (Signed)
Pt's HS CBG, 476. No insulin coverage ordered. Paged on-call cardiologist for intervention. Dr. Booker Lions returned page with order for sliding scale novolog. Verified with MD that scale maxed out at 400 with 5 units of novolog. MD verbally ordered to only give 5 units because pt had a hypoglycemic episode earlier today. Will follow orders and continue to monitor closely.  Jacqlyn Larsen, RN

## 2017-06-21 NOTE — Progress Notes (Addendum)
Pts CBG dropped from 79 to 65 in an hours time. Pt states he is getting a headache. Dr Lovena Le notified and order for D50 received and given.  Will recheck blood sugar in 20 minutes

## 2017-06-21 NOTE — Plan of Care (Signed)
SR on Monitor.

## 2017-06-21 NOTE — H&P (Signed)
HPI [name] returns today for followup. He is a pleasant middle aged man with chronic systolic heart failure, ESRD on HD, EF 30%, and syncope. When I saw him last, I recommended ICD insertion. He returns today with his wife for additional discussion.  No Known Allergies         Current Outpatient Medications  Medication Sig Dispense Refill  . acetaminophen (TYLENOL) 500 MG tablet Take 500 mg by mouth daily as needed for moderate pain or headache.    Marland Kitchen aspirin 81 MG tablet Take 81 mg by mouth daily.    . calcitRIOL (ROCALTROL) 0.25 MCG capsule Take 0.25 mcg by mouth daily.    . calcium acetate (PHOSLO) 667 MG capsule Take 2 capsules (1,334 mg total) by mouth 3 (three) times daily with meals. 90 capsule 0  . carvedilol (COREG) 6.25 MG tablet Take 1 tablet (6.25 mg total) 2 (two) times daily by mouth. 180 tablet 1  . Cholecalciferol (VITAMIN D PO) Take 1 tablet by mouth daily.    . colchicine 0.6 MG tablet Take 1 tablet (0.6 mg total) by mouth daily. 90 tablet 1  . fish oil-omega-3 fatty acids 1000 MG capsule Take 1 g by mouth daily.    . insulin detemir (LEVEMIR) 100 UNIT/ML injection Inject 0.1 mLs (10 Units total) into the skin 2 (two) times daily. 20 mL 0  . insulin lispro (HUMALOG) 100 UNIT/ML injection Inject 7 Units into the skin 3 (three) times daily before meals.    . isosorbide mononitrate (IMDUR) 30 MG 24 hr tablet Take 0.5 tablets (15 mg total) daily by mouth. 90 tablet 3  . ivabradine (CORLANOR) 5 MG TABS tablet Take 1 tablet (5 mg total) by mouth 2 (two) times daily with a meal. 180 tablet 2  . Magnesium 250 MG TABS Take 250 mg by mouth daily.     . Misc Natural Products (TART CHERRY ADVANCED PO) Take 1 tablet by mouth daily.     . Multiple Minerals (CALCIUM/MAGNESIUM/ZINC PO) Take 1 tablet by mouth daily.     . Multiple Vitamin (MULTIVITAMIN WITH MINERALS) TABS Take 1 tablet by mouth daily.    . rosuvastatin (CRESTOR) 20 MG tablet Take 1 tablet (20 mg  total) by mouth daily. 90 tablet 3  . traMADol (ULTRAM) 50 MG tablet Take 50 mg by mouth every 6 (six) hours as needed (pain).    . vitamin B-12 (CYANOCOBALAMIN) 1000 MCG tablet Take 1,000 mcg by mouth daily.    Marland Kitchen VITAMIN E PO Take 1 capsule by mouth daily.     No current facility-administered medications for this visit.          Past Medical History:  Diagnosis Date  . Anemia   . CAD (coronary artery disease) 09/12/2016   R/L heart cath 09/01/16:  nl LAD, pLCx 25, pRCA 25, EF 25-35; Mean RA 8, PASP 60, mean PA 43, LVEDP 29  . Chronic systolic CHF (congestive heart failure) ([place])    Echo 12/17: EF 40-45 // L HC 7/18: EF 25-35, LVEDP 29  . Diabetes mellitus   . ED (erectile dysfunction)   . ESRD on hemodialysis ([facility])    a. started HD 07/2016  . GERD (gastroesophageal reflux disease)    occasional  . Hyperlipidemia   . Hypertension    [name]  . NICM (nonischemic cardiomyopathy) ([place])    a. Echo 12/17-mod LVH, EF 40-45, diff HK, diast+syst flattening, RVH, mild LAE/RAE, mild MR, PASP 28mmHg //  Nuclear  stress test 01/2016: EF 42, no ischemia. // R/L Ht Cath 7/18: min cor plaque; elev LVEDP, elev PASP >>   . Obesity   . Peripheral neuropathy    neuropathy, "tingling in feet"  . PPD positive, treated   . Prostate cancer ([place])   . Pulmonary hypertension ([place])    severe PAH by 01/2016 echo  . Pulmonary hypertension ([place])    a. PASP 94 by echo 01/2016. b. After initiation of dialysis, cath 08/2016 showed moderate pulm HTN.    ROS:   All systems reviewed and negative except as noted in the HPI.        Past Surgical History:  Procedure Laterality Date  . AMPUTATION  01/26/2012   Procedure: AMPUTATION RAY;  Surgeon: Newt Minion, MD;  Location: [facility];  Service: Orthopedics;  Laterality: Right;  Right foot 2nd ray amputation  . AV FISTULA PLACEMENT Left 10/09/2014   Procedure: CREATION OF LEFT RADIOCEPHALIC ARTERIOVENOUS (AV)  FISTULA ;  Surgeon: Serafina Mitchell, MD;  Location: [facility];  Service: Vascular;  Laterality: Left;  . COLONOSCOPY    . FRACTURE SURGERY     shoulder surgery  . KNEE CARTILAGE SURGERY    . PARS PLANA VITRECTOMY Left 12/17/2012   Procedure: LEFT PARS PLANA VITRECTOMY WITH 25 GAUGE/ENDO LASER;  Surgeon: Hurman Horn, MD;  Location: [facility];  Service: Ophthalmology;  Laterality: Left;  . PARS PLANA VITRECTOMY Right 02/05/2016   Procedure: PARS PLANA VITRECTOMY WITH 25 GAUGE;  Surgeon: Jalene Mullet, MD;  Location: [facility];  Service: Ophthalmology;  Laterality: Right;  with block  . PHOTOCOAGULATION WITH LASER Left 12/17/2012   Procedure: PHOTOCOAGULATION WITH LASER;  Surgeon: Hurman Horn, MD;  Location: [facility];  Service: Ophthalmology;  Laterality: Left;  . PROSTATECTOMY  2011  . RIGHT/LEFT HEART CATH AND CORONARY ANGIOGRAPHY N/A 09/01/2016   Procedure: Right/Left Heart Cath and Coronary Angiography;  Surgeon: Martinique, Peter M, MD;  Location: [facility];  Service: Cardiovascular;  Laterality: N/A;  . VASECTOMY            Family History  Problem Relation Age of Onset  . Diabetes Mother   . Diabetes Father      Social History        Socioeconomic History  . Marital status: Married    Spouse name: Not on file  . Number of children: 4  . Years of education: Not on file  . Highest education level: Not on file  Social Needs  . Financial resource strain: Not on file  . Food insecurity - worry: Not on file  . Food insecurity - inability: Not on file  . Transportation needs - medical: Not on file  . Transportation needs - non-medical: Not on file  Occupational History  . Occupation: Proofreader  Tobacco Use  . Smoking status: Never Smoker  . Smokeless tobacco: Never Used  Substance and Sexual Activity  . Alcohol use: No  . Drug use: No  . Sexual activity: Yes  Other Topics Concern  . Not on file  Social History Narrative  . Not on file     BP  130/64   Pulse 98   Ht 6' (1.829 m)   Wt 241 lb (109.3 kg)   BMI 32.69 kg/m   Physical Exam:  Well appearing 57 yo man, NAD HEENT: Unremarkable Neck:  6 cm JVD, no thyromegally Lymphatics:  No adenopathy Back:  No CVA tenderness Lungs:  Clear with no wheezes HEART:  Regular rate rhythm, no murmurs, no rubs, no clicks Abd:  soft, positive bowel sounds, no organomegally, no rebound, no guarding Ext:  2 plus pulses, no edema, no cyanosis, no clubbing Skin:  No rashes no nodules Neuro:  CN II through XII intact, motor grossly intact  Assess/Plan: 1. Chronic systolic heart failure - I have discussed the treatment options with the patient and recommended insertion of a subcutaneous ICD. He will call us if he wishes to proceed. 2. HTN - his blood pressure is minimally elevated. Will follow.  Ponciano Ort.  EP Attending  Patient seen and examined. Agree with above. The patient presents today for ICD insertion. I have discussed the indications, risks/benefits/goals/expectations of ICD insertion and he wishes to proceed.   Mikle Bosworth.D.

## 2017-06-21 NOTE — Transfer of Care (Signed)
Immediate Anesthesia Transfer of Care Note  Patient: Austin Kramer  Procedure(s) Performed: SUBQ ICD IMPLANT (N/A )  Patient Location: Cath Lab  Anesthesia Type:General  Level of Consciousness: awake and patient cooperative  Airway & Oxygen Therapy: Patient Spontanous Breathing  Post-op Assessment: Report given to RN and Post -op Vital signs reviewed and stable  Post vital signs: Reviewed and stable  Last Vitals:  Vitals Value Taken Time  BP    Temp    Pulse 85 06/21/2017  3:22 PM  Resp 12 06/21/2017  3:22 PM  SpO2 99 % 06/21/2017  3:22 PM  Vitals shown include unvalidated device data.  Last Pain:  Vitals:   06/21/17 1011  TempSrc:   PainSc: 0-No pain         Complications: No apparent anesthesia complications

## 2017-06-22 ENCOUNTER — Encounter (HOSPITAL_COMMUNITY): Payer: Self-pay | Admitting: Internal Medicine

## 2017-06-22 DIAGNOSIS — I5022 Chronic systolic (congestive) heart failure: Secondary | ICD-10-CM | POA: Diagnosis not present

## 2017-06-22 DIAGNOSIS — N186 End stage renal disease: Secondary | ICD-10-CM | POA: Diagnosis not present

## 2017-06-22 DIAGNOSIS — E1122 Type 2 diabetes mellitus with diabetic chronic kidney disease: Secondary | ICD-10-CM | POA: Diagnosis not present

## 2017-06-22 DIAGNOSIS — I132 Hypertensive heart and chronic kidney disease with heart failure and with stage 5 chronic kidney disease, or end stage renal disease: Secondary | ICD-10-CM | POA: Diagnosis not present

## 2017-06-22 DIAGNOSIS — Z79899 Other long term (current) drug therapy: Secondary | ICD-10-CM | POA: Diagnosis not present

## 2017-06-22 DIAGNOSIS — I429 Cardiomyopathy, unspecified: Secondary | ICD-10-CM | POA: Diagnosis not present

## 2017-06-22 DIAGNOSIS — K219 Gastro-esophageal reflux disease without esophagitis: Secondary | ICD-10-CM | POA: Diagnosis not present

## 2017-06-22 DIAGNOSIS — Z7982 Long term (current) use of aspirin: Secondary | ICD-10-CM | POA: Diagnosis not present

## 2017-06-22 DIAGNOSIS — Z992 Dependence on renal dialysis: Secondary | ICD-10-CM | POA: Diagnosis not present

## 2017-06-22 DIAGNOSIS — I2510 Atherosclerotic heart disease of native coronary artery without angina pectoris: Secondary | ICD-10-CM | POA: Diagnosis not present

## 2017-06-22 DIAGNOSIS — Z794 Long term (current) use of insulin: Secondary | ICD-10-CM | POA: Diagnosis not present

## 2017-06-22 LAB — GLUCOSE, CAPILLARY
Glucose-Capillary: 309 mg/dL — ABNORMAL HIGH (ref 65–99)
Glucose-Capillary: 271 mg/dL — ABNORMAL HIGH (ref 65–99)

## 2017-06-22 MED ORDER — OXYCODONE-ACETAMINOPHEN 5-325 MG PO TABS: 1.0000 | ORAL_TABLET | Freq: Four times a day (QID) | ORAL | 0 refills | Status: AC | PRN

## 2017-06-22 MED ORDER — OXYCODONE-ACETAMINOPHEN 5-325 MG PO TABS: 1.0000 | ORAL_TABLET | Freq: Four times a day (QID) | ORAL | 0 refills | Status: DC | PRN

## 2017-06-22 NOTE — H&P (Signed)
ICD Criteria  Current LVEF:30%. Within 12 months prior to implant: Yes   Heart failure history: Yes, Class II  Cardiomyopathy history: yes  Atrial Fibrillation/Atrial Flutter: No.  Ventricular tachycardia history: No.  Cardiac arrest history: No.  History of syndromes with risk of sudden death: No.  Previous ICD: no   Current ICD indication: Primary  PPM indication: No.   Class I or II Bradycardia indication present: No  Beta Blocker therapy for 3 or more months: Yes, prescribed.   Ace Inhibitor/ARB therapy for 3 or more months: No, medical reason. renal failure

## 2017-06-22 NOTE — Discharge Summary (Addendum)
ELECTROPHYSIOLOGY PROCEDURE DISCHARGE SUMMARY    Patient ID: Austin Kramer,  MRN: [id number], DOB/AGE: Aug 29, 1960 57 y.o.  Admit date: 06/21/2017 Discharge date: 06/22/2017  Primary Care Physician: Denita Lung, MD  Primary Cardiologist: Dr. Meda Coffee Electrophysiologist: Dr. Lovena Le  Primary Discharge Diagnosis:  1. NICM  Secondary Discharge Diagnosis:  1. ESRF on HD 2. Chronic CHF (systolic) 3. DM 4. HTN  No Known Allergies   Procedures This Admission:  1.  Implantation of a BSCi S-ICD on 06/21/17 by Dr Lovena Le.  The patient received a Chemical engineer subcutaneous ICD, serial [id number], [place] subcutaneous ICD lead, serial [id number].  DFT's were successful at 64 J.   There were no immediate post procedure complications.   Brief HPI: Austin Kramer is a 57 y.o. male was referred to electrophysiology in the outpatient setting for consideration of ICD implantation.  Past medical history includes ESRF on HD, NICM, chronic CHF, DM, HTN, prostate ca treated.  The patient has persistent LV dysfunction despite maximal medical therapy to he limitations of renal function, and some degree of BP limitations as well.  Risks, benefits, and alternatives to ICD implantation were reviewed with the patient who wished to proceed.    Hospital Course:  The patient was admitted and underwent implantation of an ICD with details as outlined above. He was monitored on telemetry overnight which demonstrated SR.  Left infra-axillary site and sternal sites are without hematoma or ecchymosis.  The device was interrogated and found to be functioning normally.  The patient higher BS last PM, off his usual insulin regime, will resume his home meds on discharge.  Wound care, arm mobility, and restrictions were reviewed with the patient.  The patient has post-op discomfort, will Rx Percocet, NCCRS was reviewed, no active narcotic Rx for the patient, last was > 1 year ago, he denies CP or SOB, he  was examined by Dr. Lovena Le and considered stable for discharge to home.    Physical Exam: Vitals:   06/21/17 1713 06/21/17 2118 06/22/17 0451 06/22/17 0454  BP:  (!) 168/90  (!) 170/100  Pulse:  (!) 105  92  Resp: 20 18  16   Temp:  (!) 97.5 F (36.4 C)  98 F (36.7 C)  TempSrc:  Oral  Oral  SpO2:  98%  98%  Weight:   246 lb 1.6 oz (111.6 kg)   Height:        GEN- The patient is well appearing, alert and oriented x 3 today.   HEENT: normocephalic, atraumatic; sclera clear, conjunctiva pink; hearing intact; oropharynx clear Lungs- CTA b/l, normal work of breathing.  No wheezes, rales, rhonchi Heart- RRR, no murmurs, rubs or gallops, PMI not laterally displaced GI- soft, non-tender, non-distended, bowel sounds present Extremities- no clubbing, cyanosis, or edema MS- no significant deformity or atrophy Skin- warm and dry, no rash or lesion, sternal wounds and L infra-axillary site are all stable, no active bleeding, no hematoma Psych- euthymic mood, full affect Neuro- no gross defecits  Labs:   Lab Results  Component Value Date   WBC 7.4 06/12/2017   HGB 11.1 (L) 06/12/2017   HCT 34.8 (L) 06/12/2017   MCV 84 06/12/2017   PLT 247 06/12/2017   No results for input(s): NA, K, CL, CO2, BUN, CREATININE, CALCIUM, PROT, BILITOT, ALKPHOS, ALT, AST, GLUCOSE in the last 168 hours.  Invalid input(s): LABALBU  Discharge Medications:  Allergies as of 06/22/2017   No Known Allergies     Medication List  TAKE these medications   acetaminophen 500 MG tablet Commonly known as:  TYLENOL Take 1,000 mg by mouth daily as needed for moderate pain or headache. Notes to patient:  Do not take in combination with the Percocet which contains acetaminophen.     AIRBORNE Chew Chew 1 tablet by mouth daily.   amLODipine 5 MG tablet Commonly known as:  NORVASC Take 5 mg by mouth daily.   aspirin 81 MG tablet Take 81 mg by mouth daily. Notes to patient:  Resume 06/26/17   calcitRIOL 0.25  MCG capsule Commonly known as:  ROCALTROL Take 0.25 mcg by mouth daily.   calcium acetate 667 MG capsule Commonly known as:  PHOSLO Take 2 capsules (1,334 mg total) by mouth 3 (three) times daily with meals. What changed:  how much to take   CALCIUM/MAGNESIUM/ZINC PO Take 1 tablet by mouth daily.   carvedilol 6.25 MG tablet Commonly known as:  COREG Take 1 tablet (6.25 mg total) 2 (two) times daily by mouth.   cloNIDine 0.2 MG tablet Commonly known as:  CATAPRES Take 0.2 mg by mouth at bedtime.   colchicine 0.6 MG tablet Take 1 tablet (0.6 mg total) by mouth daily.   fish oil-omega-3 fatty acids 1000 MG capsule Take 1 g by mouth daily.   furosemide 80 MG tablet Commonly known as:  LASIX Take 80 mg by mouth daily as needed. Edema   hydrALAZINE 25 MG tablet Commonly known as:  APRESOLINE Take 25 mg by mouth daily.   insulin detemir 100 UNIT/ML injection Commonly known as:  LEVEMIR Inject 0.1 mLs (10 Units total) into the skin 2 (two) times daily.   insulin lispro 100 UNIT/ML injection Commonly known as:  HUMALOG Inject 7 Units into the skin 3 (three) times daily before meals.   isosorbide mononitrate 30 MG 24 hr tablet Commonly known as:  IMDUR Take 0.5 tablets (15 mg total) daily by mouth.   ivabradine 5 MG Tabs tablet Commonly known as:  CORLANOR Take 1 tablet (5 mg total) by mouth 2 (two) times daily with a meal.   Magnesium 250 MG Tabs Take 250 mg by mouth daily.   multivitamin with minerals Tabs tablet Take 1 tablet by mouth daily.   oxyCODONE-acetaminophen 5-325 MG tablet Commonly known as:  PERCOCET Take 1 tablet by mouth every 6 (six) hours as needed for up to 3 days for severe pain.   rosuvastatin 20 MG tablet Commonly known as:  CRESTOR Take 1 tablet (20 mg total) by mouth daily.   TART CHERRY ADVANCED PO Take 1 tablet by mouth daily.   vitamin B-12 1000 MCG tablet Commonly known as:  CYANOCOBALAMIN Take 1,000 mcg by mouth daily.     VITAMIN D PO Take 1 tablet by mouth daily.   VITAMIN E PO Take 1 capsule by mouth daily.       Disposition:  Home Discharge Instructions    Diet - low sodium heart healthy   Complete by:  As directed    Increase activity slowly   Complete by:  As directed      Follow-up Information    [facility] Follow up on 07/02/2017.   Specialty:  Cardiology Why:  4:00PM, wound check visit Contact information: [address], [address] [place]       Evans Lance, MD Follow up on 09/26/2017.   Specialty:  Cardiology Why:  4:15PM Contact information: [address]  [place] [address] [phone]        Dorothy Spark, MD Follow up on 07/26/2017.   Specialty:  Cardiology Why:  11:00AM Contact information: [place] [address] [phone]           Duration of Discharge Encounter: Greater than 30 minutes including physician time.  Venetia Night, PA-C 06/22/2017 8:52 AM  EP Attending Patient seen and examined. Agree with above. The patient is doing well, s/p S-ICD insertion. His incisions look good. Pain appears controlled. He will be discharged home. Usual followup. His device has been interogated under my direction and is working normally.  Mikle Bosworth.D.

## 2017-06-22 NOTE — Discharge Instructions (Signed)
° ° °  Supplemental Discharge Instructions for  Pacemaker/Defibrillator Patients  Activity No heavy lifting or vigorous activity with your left arm until seen at your wound check visit, gradually increase activity with left arm.       NO DRIVING for  1 week   ; you may begin driving on  7/86/75 .  WOUND CARE - Keep the wound area clean and dry.  Do not get this area wet for one week. No showers for one week; you may shower on     . - The tape/steri-strips on your wound will fall off; do not pull them off.  No bandage is needed on the site.  DO  NOT apply any creams, oils, or ointments to the wound area. - If you notice any drainage or discharge from the wound, any swelling or bruising at the site, or you develop a fever > 101? F after you are discharged home, call the office at once.  Special Instructions - You are still able to use cellular telephones; use the ear opposite the side where you have your pacemaker/defibrillator.  Avoid carrying your cellular phone near your device. - When traveling through airports, show security personnel your identification card to avoid being screened in the metal detectors.  Ask the security personnel to use the hand wand. - Avoid arc welding equipment, MRI testing (magnetic resonance imaging), TENS units (transcutaneous nerve stimulators).  Call the office for questions about other devices. - Avoid electrical appliances that are in poor condition or are not properly grounded. - Microwave ovens are safe to be near or to operate.  Additional information for defibrillator patients should your device go off: - If your device goes off ONCE and you feel fine afterward, notify the device clinic nurses. - If your device goes off ONCE and you do not feel well afterward, call 911. - If your device goes off TWICE, call 911. - If your device goes off THREE times in one day, call 911.  DO NOT DRIVE YOURSELF OR A FAMILY MEMBER WITH A DEFIBRILLATOR TO THE  HOSPITAL--CALL 911.

## 2017-06-22 NOTE — Progress Notes (Signed)
The patient has been given his discharge instructions along with a new medication list and what to take today. He has a new prescription to pick up for oxycodone with his paper copy. He has been educated and given instructions for the new pacemaker. He is discharging via car with family.   Saddie Benders RN

## 2017-06-25 DIAGNOSIS — N186 End stage renal disease: Secondary | ICD-10-CM | POA: Diagnosis not present

## 2017-06-25 DIAGNOSIS — N2581 Secondary hyperparathyroidism of renal origin: Secondary | ICD-10-CM | POA: Diagnosis not present

## 2017-06-25 DIAGNOSIS — R52 Pain, unspecified: Secondary | ICD-10-CM | POA: Diagnosis not present

## 2017-06-27 ENCOUNTER — Telehealth: Payer: Self-pay | Admitting: Internal Medicine

## 2017-06-27 DIAGNOSIS — N2581 Secondary hyperparathyroidism of renal origin: Secondary | ICD-10-CM | POA: Diagnosis not present

## 2017-06-27 DIAGNOSIS — R52 Pain, unspecified: Secondary | ICD-10-CM | POA: Diagnosis not present

## 2017-06-27 DIAGNOSIS — N186 End stage renal disease: Secondary | ICD-10-CM | POA: Diagnosis not present

## 2017-06-27 NOTE — Telephone Encounter (Signed)
Attempted to call pt back on cell phone regarding FMLA, voicemail box full left message on home answering machine OK per DPR that pt needed to call his insurance company and find out why the claim was denied and then reach out to the office if something on the paperwork was amiss that needed to be completed on this end.

## 2017-06-27 NOTE — Telephone Encounter (Signed)
New Message:        Pt has some questions about his surgery. Pt wants to know is it okay to ride his lawn mower to cut the grass. Pt states he will not be doing any lifting.

## 2017-06-27 NOTE — Telephone Encounter (Signed)
Spoke with pt informed him that it was ok to ride his lawn mower, pt asked about if he needed to stand over his home monitoring box every Monday to transmit informed pt that he just needed to keep the box plugged up beside his bed and the device would monitor him at night pt stated that he had the box near his TV now but would move it beside his bed. Pt asked about FMLA paperwork being denied, informed pt that I would try to find out about his paperwork. Pt stated that he could bring the paperwork up to the office if needed.

## 2017-06-28 ENCOUNTER — Encounter: Payer: Self-pay | Admitting: Gastroenterology

## 2017-07-02 ENCOUNTER — Ambulatory Visit (INDEPENDENT_AMBULATORY_CARE_PROVIDER_SITE_OTHER): Payer: BLUE CROSS/BLUE SHIELD | Admitting: *Deleted

## 2017-07-02 ENCOUNTER — Encounter (HOSPITAL_COMMUNITY): Payer: Self-pay | Admitting: Internal Medicine

## 2017-07-02 DIAGNOSIS — I428 Other cardiomyopathies: Secondary | ICD-10-CM | POA: Diagnosis not present

## 2017-07-02 DIAGNOSIS — N2581 Secondary hyperparathyroidism of renal origin: Secondary | ICD-10-CM | POA: Diagnosis not present

## 2017-07-02 DIAGNOSIS — N186 End stage renal disease: Secondary | ICD-10-CM | POA: Diagnosis not present

## 2017-07-02 DIAGNOSIS — I5022 Chronic systolic (congestive) heart failure: Secondary | ICD-10-CM | POA: Diagnosis not present

## 2017-07-02 NOTE — Addendum Note (Signed)
Addendum  created 07/02/17 1546 by Lillia Abed, MD   Intraprocedure Event edited, Intraprocedure Staff edited

## 2017-07-03 ENCOUNTER — Telehealth: Payer: Self-pay

## 2017-07-03 ENCOUNTER — Ambulatory Visit: Payer: BLUE CROSS/BLUE SHIELD | Admitting: Gastroenterology

## 2017-07-03 DIAGNOSIS — N2581 Secondary hyperparathyroidism of renal origin: Secondary | ICD-10-CM | POA: Diagnosis not present

## 2017-07-03 DIAGNOSIS — N186 End stage renal disease: Secondary | ICD-10-CM | POA: Diagnosis not present

## 2017-07-03 LAB — CUP PACEART INCLINIC DEVICE CHECK
Date Time Interrogation Session: 20190521090344
Implantable Lead Location: 753858
Implantable Lead Model: 3401
Implantable Lead Serial Number: 123518
Implantable Pulse Generator Implant Date: 20190509
MDC IDC LEAD IMPLANT DT: 20190509
MDC IDC PG SERIAL: 243235

## 2017-07-03 NOTE — Telephone Encounter (Signed)
Austin Kramer,  I spoke with patient this am he is coming in to make payment and sign release.

## 2017-07-03 NOTE — Progress Notes (Signed)
Wound check appointment. Steri-strips removed. Wound without redness or edema. Incision edges approximated, wound well healed. Device checked by industry. Subcutaneous ICD check in clinic by industry. 0 untreated episodes; (1) treated episode, shock x1 - TWOS; gain setting increased from 1x to 2x. Electrode impedance status okay.  Remaining longevity to ERI 99%. Patient educated about wound care, arm mobility, and shock plan. ROV in 3 months with GT.

## 2017-07-03 NOTE — Telephone Encounter (Signed)
Spoke with patient today who was asking on status of FMLA paperwork. I read last phone note on 5/15 from EW who LVM for patient stating that he needed to reach out to insurance company to determine if the paperwork was the reason his claim was denied. Patient stated that he would reach out to insurance company. After speaking with patient SS stated that she spoke with JS on 5/20 who had filled FMLA paperwork and given to medical records. Kim in medical records stated that a payment and authorization packet had been sent to patient on 4/26 with no response from patient at this time. I will forward to DR. Taylors nurse for follow up.

## 2017-07-04 ENCOUNTER — Telehealth: Payer: Self-pay | Admitting: Cardiology

## 2017-07-04 DIAGNOSIS — N186 End stage renal disease: Secondary | ICD-10-CM | POA: Diagnosis not present

## 2017-07-04 DIAGNOSIS — N2581 Secondary hyperparathyroidism of renal origin: Secondary | ICD-10-CM | POA: Diagnosis not present

## 2017-07-04 NOTE — Telephone Encounter (Signed)
Will refer this to Dr Tanna Furry RN in EP, for they are managing his FMLA, and recently placed a device in the pt.

## 2017-07-04 NOTE — Telephone Encounter (Signed)
New Message    Patient is calling to request a return to work letter. He states that it needs to indicate if there are any restrictions. Please call to discuss.

## 2017-07-05 ENCOUNTER — Telehealth: Payer: Self-pay | Admitting: Internal Medicine

## 2017-07-05 NOTE — Telephone Encounter (Signed)
Complete

## 2017-07-05 NOTE — Telephone Encounter (Signed)
New Message:       Pt is calling and needs a note back to work and needs to come and pick it up today by 11 am today. Due to [place] being closed on Monday and he needs to return  Back to work on Tuesday 07/10/17

## 2017-07-05 NOTE — Telephone Encounter (Signed)
Returned call to Pt.  Notified letter ready for pick up.

## 2017-07-09 DIAGNOSIS — N186 End stage renal disease: Secondary | ICD-10-CM | POA: Diagnosis not present

## 2017-07-09 DIAGNOSIS — N2581 Secondary hyperparathyroidism of renal origin: Secondary | ICD-10-CM | POA: Diagnosis not present

## 2017-07-11 DIAGNOSIS — N186 End stage renal disease: Secondary | ICD-10-CM | POA: Diagnosis not present

## 2017-07-11 DIAGNOSIS — N2581 Secondary hyperparathyroidism of renal origin: Secondary | ICD-10-CM | POA: Diagnosis not present

## 2017-07-12 ENCOUNTER — Telehealth: Payer: Self-pay | Admitting: Internal Medicine

## 2017-07-12 NOTE — Telephone Encounter (Signed)
Pt brought in Return To Work Form today in office. Sonia Baller assisted in completing this for for the patient. Copy scanned into chart. Original given to patient.

## 2017-07-13 DIAGNOSIS — N2581 Secondary hyperparathyroidism of renal origin: Secondary | ICD-10-CM | POA: Diagnosis not present

## 2017-07-13 DIAGNOSIS — E1129 Type 2 diabetes mellitus with other diabetic kidney complication: Secondary | ICD-10-CM | POA: Diagnosis not present

## 2017-07-13 DIAGNOSIS — Z992 Dependence on renal dialysis: Secondary | ICD-10-CM | POA: Diagnosis not present

## 2017-07-13 DIAGNOSIS — N186 End stage renal disease: Secondary | ICD-10-CM | POA: Diagnosis not present

## 2017-07-16 ENCOUNTER — Telehealth: Payer: Self-pay

## 2017-07-16 DIAGNOSIS — N2581 Secondary hyperparathyroidism of renal origin: Secondary | ICD-10-CM | POA: Diagnosis not present

## 2017-07-16 DIAGNOSIS — N186 End stage renal disease: Secondary | ICD-10-CM | POA: Diagnosis not present

## 2017-07-16 NOTE — Telephone Encounter (Signed)
LVM for call back - need to schedule appt with industry for re-programing. Industry Licensed conveyancer) aware. Per industry 6/4 in the afternoon if possible.

## 2017-07-16 NOTE — Telephone Encounter (Signed)
Received call from industry that patient had reached out to [facility]. Patient available for in office check at 4:30. Joey will be available for check. Appointment scheduled.

## 2017-07-17 ENCOUNTER — Ambulatory Visit (INDEPENDENT_AMBULATORY_CARE_PROVIDER_SITE_OTHER): Payer: Self-pay | Admitting: *Deleted

## 2017-07-17 DIAGNOSIS — I255 Ischemic cardiomyopathy: Secondary | ICD-10-CM

## 2017-07-17 LAB — CUP PACEART INCLINIC DEVICE CHECK
Date Time Interrogation Session: 20190604172149
Implantable Lead Location: 753858
MDC IDC LEAD IMPLANT DT: 20190509
MDC IDC LEAD SERIAL: 123518
MDC IDC PG IMPLANT DT: 20190509
Pulse Gen Serial Number: 243235

## 2017-07-17 NOTE — Progress Notes (Signed)
Device check in clinic by industry. See scanned report for details. Reprogrammed gain setting to 1x from 2x, sensing configuration to primary from secondary. ROV with GT 09/26/17

## 2017-07-18 DIAGNOSIS — N186 End stage renal disease: Secondary | ICD-10-CM | POA: Diagnosis not present

## 2017-07-18 DIAGNOSIS — N2581 Secondary hyperparathyroidism of renal origin: Secondary | ICD-10-CM | POA: Diagnosis not present

## 2017-07-19 ENCOUNTER — Other Ambulatory Visit (HOSPITAL_COMMUNITY): Payer: BLUE CROSS/BLUE SHIELD

## 2017-07-20 DIAGNOSIS — N2581 Secondary hyperparathyroidism of renal origin: Secondary | ICD-10-CM | POA: Diagnosis not present

## 2017-07-20 DIAGNOSIS — N186 End stage renal disease: Secondary | ICD-10-CM | POA: Diagnosis not present

## 2017-07-23 DIAGNOSIS — N186 End stage renal disease: Secondary | ICD-10-CM | POA: Diagnosis not present

## 2017-07-23 DIAGNOSIS — N2581 Secondary hyperparathyroidism of renal origin: Secondary | ICD-10-CM | POA: Diagnosis not present

## 2017-07-25 DIAGNOSIS — N186 End stage renal disease: Secondary | ICD-10-CM | POA: Diagnosis not present

## 2017-07-25 DIAGNOSIS — N2581 Secondary hyperparathyroidism of renal origin: Secondary | ICD-10-CM | POA: Diagnosis not present

## 2017-07-26 ENCOUNTER — Ambulatory Visit (INDEPENDENT_AMBULATORY_CARE_PROVIDER_SITE_OTHER): Payer: BLUE CROSS/BLUE SHIELD | Admitting: Cardiology

## 2017-07-26 ENCOUNTER — Encounter: Payer: Self-pay | Admitting: Cardiology

## 2017-07-26 ENCOUNTER — Encounter: Payer: Self-pay | Admitting: *Deleted

## 2017-07-26 ENCOUNTER — Encounter (INDEPENDENT_AMBULATORY_CARE_PROVIDER_SITE_OTHER): Payer: Self-pay

## 2017-07-26 VITALS — BP 130/80 | HR 97 | Ht 72.0 in | Wt 247.6 lb

## 2017-07-26 DIAGNOSIS — I10 Essential (primary) hypertension: Secondary | ICD-10-CM

## 2017-07-26 DIAGNOSIS — I5022 Chronic systolic (congestive) heart failure: Secondary | ICD-10-CM | POA: Diagnosis not present

## 2017-07-26 DIAGNOSIS — I428 Other cardiomyopathies: Secondary | ICD-10-CM | POA: Diagnosis not present

## 2017-07-26 DIAGNOSIS — I2510 Atherosclerotic heart disease of native coronary artery without angina pectoris: Secondary | ICD-10-CM | POA: Diagnosis not present

## 2017-07-26 DIAGNOSIS — I5043 Acute on chronic combined systolic (congestive) and diastolic (congestive) heart failure: Secondary | ICD-10-CM

## 2017-07-26 DIAGNOSIS — I110 Hypertensive heart disease with heart failure: Secondary | ICD-10-CM | POA: Diagnosis not present

## 2017-07-26 DIAGNOSIS — I272 Pulmonary hypertension, unspecified: Secondary | ICD-10-CM

## 2017-07-26 MED ORDER — FUROSEMIDE 40 MG PO TABS: 40.0000 mg | ORAL_TABLET | Freq: Every day | ORAL | 3 refills | Status: DC

## 2017-07-26 NOTE — Progress Notes (Signed)
Cardiology Office Note    Date:  07/26/2017  ID:  Austin Kramer, DOB 29-Jan-1961, MRN [id number] PCP:  Denita Lung, MD  Cardiologist:  Dr. Meda Coffee   Chief Complaint: f/u cardiomyopathy  History of Present Illness:  Austin Kramer is a 57 y.o. male with history of chronic systolic CHF/non-ischemic cardiomyopathy (mild nonobstructive CAD by cath 08/2016), DM type 2 (since his 31s), HTN, HLD, ESRD (placed on dialysis 07/2016), prostate CA s/p prostatectomy, GERD, peripheral neuropathy, pulmonary HTN, anemia who presents for f/u.  He established care in 12/2015 for DOE, edema, and occasional chest discomfort. 2D Echo 02/02/16 showed moderate LVH, EF 40-45%, diffuse HK, diastolic flattening and systolic flattening, RVH, mild LAE/RAE, mild MR, PASP 88mmHg, elevated CVP. Nuclear stress test 01/2016: EF 42%, no ischemia. Due to his PASP Dr. Meda Coffee recommended further evaluation of PE. CTA was ordered but never performed - a message was left but it does not appear he returned the call. Creatinine at that time was 7-8 without dialysis. He managed by renal in the interim, and went on HD 07/2016. Right and left heart cath 09/01/16 showed 25% prox Cx, 25% prox RCA, EF 25-35%, moderate pulm HTN, mod elevated LVEDP. Recommendation was for intensification of CHF therapy as well as additional fluid removal at dialysis. He's had issues with blood pressure dropping at dialysis. He was last seen 10/11/16 by Richardson Dopp PA-C and felt to be doing well. Imdur 15mg  daily was added with plan to recheck echo once on max guideline directed therapy. Last labs 08/2016 showed Hgb 10.0, K 3.8, Cr 4.06. Lipids are followed by primary care.  He returns for follow-up today alone. He is doing great per his report. He feels so much better on dialysis. His edema has resolved. He was originally going to throw away all his shoes because they couldn't fit when he was so edematous but now he can wear them all. No CP, SOB, diaphoresis,  dizziness, syncope. He does report continued issue with BP dropping at HD. As a result he's only taking Coreg once a day (daily) and is only taking one hydralazine twice a week (Tues/Thurs). He also states he was taken off Lasix by nephrology. He feels better since starting the Imdur. Last time BP dropped was last week down to the 80s.  04/19/2017 - this is 3 months follow-up, the patient is doing great, he continues to work 10-12 hrs shifts where he left heavy boxes and he denies any chest pain or shortness of breath, he hasn't had any lower extremity edema, orthopnea paroxysmal nocturnal dyspnea. No palpitations dizziness or syncope. He is seeing Dr. Lovena Le today to discuss ICD placement. He states that at dialysis his blood pressure tends to be low to the point where they are scared to give him hemodialysis session.  July 26, 2017 -this is 3 months follow-up, the patient underwent implantation of subcutaneous ICD on Jun 21, 2017 by Dr. Lovena Le, since then he has had one ICD shock secondary to improper sensing picking both P wave and R waves, his ICD was reprogrammed.  No shocks since then.  Otherwise he feels great he ran out of Lasix, he continues going to dialysis, he is functional class I, he can do anything he likes to do around his house, he would like to return to work.  No dizziness no syncope, no palpitations no chest pain.  No orthopnea proximal nocturnal dyspnea and only minimal lower extremity edema.  Compliant with his meds.  Past Medical  History:  Diagnosis Date  . Anemia   . CAD (coronary artery disease) 09/12/2016   R/L heart cath 09/01/16:  nl LAD, pLCx 25, pRCA 25, EF 25-35; Mean RA 8, PASP 60, mean PA 43, LVEDP 29  . Chronic systolic CHF (congestive heart failure) ([place])    Echo 12/17: EF 40-45 // L HC 7/18: EF 25-35, LVEDP 29  . Diabetes mellitus   . ED (erectile dysfunction)   . ESRD on hemodialysis ([place])    a. started HD 07/2016  . GERD (gastroesophageal reflux disease)     occasional  . Hyperlipidemia   . Hypertension    [name]  . NICM (nonischemic cardiomyopathy) ([place])    a. Echo 12/17-mod LVH, EF 40-45, diff HK, diast+syst flattening, RVH, mild LAE/RAE, mild MR, PASP 49mmHg //  Nuclear stress test 01/2016: EF 42, no ischemia. // R/L Ht Cath 7/18: min cor plaque; elev LVEDP, elev PASP >>   . Obesity   . Peripheral neuropathy    neuropathy, "tingling in feet"  . PPD positive, treated   . Prostate cancer ([place])   . Pulmonary hypertension ([place])    severe PAH by 01/2016 echo  . Pulmonary hypertension ([place])    a. PASP 94 by echo 01/2016. b. After initiation of dialysis, cath 08/2016 showed moderate pulm HTN.    Past Surgical History:  Procedure Laterality Date  . AMPUTATION  01/26/2012   Procedure: AMPUTATION RAY;  Surgeon: Newt Minion, MD;  Location: [facility];  Service: Orthopedics;  Laterality: Right;  Right foot 2nd ray amputation  . AV FISTULA PLACEMENT Left 10/09/2014   Procedure: CREATION OF LEFT RADIOCEPHALIC ARTERIOVENOUS (AV) FISTULA ;  Surgeon: Serafina Mitchell, MD;  Location: [facility];  Service: Vascular;  Laterality: Left;  . COLONOSCOPY    . FRACTURE SURGERY     shoulder surgery  . KNEE CARTILAGE SURGERY    . PARS PLANA VITRECTOMY Left 12/17/2012   Procedure: LEFT PARS PLANA VITRECTOMY WITH 25 GAUGE/ENDO LASER;  Surgeon: Hurman Horn, MD;  Location: [facility];  Service: Ophthalmology;  Laterality: Left;  . PARS PLANA VITRECTOMY Right 02/05/2016   Procedure: PARS PLANA VITRECTOMY WITH 25 GAUGE;  Surgeon: Jalene Mullet, MD;  Location: [facility];  Service: Ophthalmology;  Laterality: Right;  with block  . PHOTOCOAGULATION WITH LASER Left 12/17/2012   Procedure: PHOTOCOAGULATION WITH LASER;  Surgeon: Hurman Horn, MD;  Location: [facility];  Service: Ophthalmology;  Laterality: Left;  . PROSTATECTOMY  2011  . RIGHT/LEFT HEART CATH AND CORONARY ANGIOGRAPHY N/A 09/01/2016   Procedure: Right/Left Heart Cath and Coronary Angiography;  Surgeon: Martinique,  Peter M, MD;  Location: [facility];  Service: Cardiovascular;  Laterality: N/A;  . SUBQ ICD IMPLANT N/A 06/21/2017   Procedure: SUBQ ICD IMPLANT;  Surgeon: Evans Lance, MD;  Location: [facility];  Service: Cardiovascular;  Laterality: N/A;  . VASECTOMY      Current Medications: Current Meds  Medication Sig  . acetaminophen (TYLENOL) 500 MG tablet Take 1,000 mg by mouth daily as needed for moderate pain or headache.   Marland Kitchen amLODipine (NORVASC) 5 MG tablet Take 5 mg by mouth daily.  Marland Kitchen aspirin 81 MG tablet Take 81 mg by mouth daily.  . calcitRIOL (ROCALTROL) 0.25 MCG capsule Take 0.25 mcg by mouth daily.  . calcium acetate (PHOSLO) 667 MG capsule Take 2 capsules (1,334 mg total) by mouth 3 (three) times daily with meals. (Patient taking differently: Take 2,001 mg by  mouth 3 (three) times daily with meals. )  . Cholecalciferol (VITAMIN D PO) Take 1 tablet by mouth daily.  . cloNIDine (CATAPRES) 0.2 MG tablet Take 0.2 mg by mouth at bedtime.  . colchicine 0.6 MG tablet Take 1 tablet (0.6 mg total) by mouth daily.  . fish oil-omega-3 fatty acids 1000 MG capsule Take 1 g by mouth daily.  . furosemide (LASIX) 80 MG tablet Take 80 mg by mouth daily as needed. Edema  . hydrALAZINE (APRESOLINE) 25 MG tablet Take 25 mg by mouth daily.  . insulin detemir (LEVEMIR) 100 UNIT/ML injection Inject 0.1 mLs (10 Units total) into the skin 2 (two) times daily.  . insulin lispro (HUMALOG) 100 UNIT/ML injection Inject 7 Units into the skin 3 (three) times daily before meals.  . isosorbide mononitrate (IMDUR) 30 MG 24 hr tablet Take 0.5 tablets (15 mg total) daily by mouth.  . ivabradine (CORLANOR) 5 MG TABS tablet Take 1 tablet (5 mg total) by mouth 2 (two) times daily with a meal.  . Magnesium 250 MG TABS Take 250 mg by mouth daily.   . Misc Natural Products (TART CHERRY ADVANCED PO) Take 1 tablet by mouth daily.   . Multiple Minerals (CALCIUM/MAGNESIUM/ZINC PO) Take 1 tablet by mouth daily.   .  Multiple Vitamin (MULTIVITAMIN WITH MINERALS) TABS Take 1 tablet by mouth daily.  . Multiple Vitamins-Minerals (AIRBORNE) CHEW Chew 1 tablet by mouth daily.  . rosuvastatin (CRESTOR) 20 MG tablet Take 1 tablet (20 mg total) by mouth daily.  . vitamin B-12 (CYANOCOBALAMIN) 1000 MCG tablet Take 1,000 mcg by mouth daily.  Marland Kitchen VITAMIN E PO Take 1 capsule by mouth daily.     Allergies:   Patient has no known allergies.   Social History   Socioeconomic History  . Marital status: Married    Spouse name: Not on file  . Number of children: 4  . Years of education: Not on file  . Highest education level: Not on file  Occupational History  . Occupation: Office manager  . Financial resource strain: Not on file  . Food insecurity:    Worry: Not on file    Inability: Not on file  . Transportation needs:    Medical: Not on file    Non-medical: Not on file  Tobacco Use  . Smoking status: Never Smoker  . Smokeless tobacco: Never Used  Substance and Sexual Activity  . Alcohol use: No  . Drug use: No  . Sexual activity: Yes  Lifestyle  . Physical activity:    Days per week: Not on file    Minutes per session: Not on file  . Stress: Not on file  Relationships  . Social connections:    Talks on phone: Not on file    Gets together: Not on file    Attends religious service: Not on file    Active member of club or organization: Not on file    Attends meetings of clubs or organizations: Not on file    Relationship status: Not on file  Other Topics Concern  . Not on file  Social History Narrative  . Not on file     Family History:  Family History  Problem Relation Age of Onset  . Diabetes Mother   . Diabetes Father      ROS:   Please see the history of present illness.  All other systems are reviewed and otherwise negative.    PHYSICAL EXAM:   VS:  BP  130/80   Pulse 97   Ht 6' (1.829 m)   Wt 247 lb 9.6 oz (112.3 kg)   SpO2 99%   BMI 33.58 kg/m   BMI: Body mass  index is 33.58 kg/m. GEN: Well nourished, well developed obese AAM, in no acute distress  HEENT: normocephalic, atraumatic Neck: no JVD, carotid bruits, or masses Cardiac: RRR; no murmurs, rubs, or gallops, no edema  Respiratory:  clear to auscultation bilaterally, normal work of breathing GI: soft, nontender, nondistended, + BS MS: no deformity or atrophy  Skin: warm and dry, no rash Neuro:  Alert and Oriented x 3, Strength and sensation are intact, follows commands Psych: euthymic mood, full affect  Wt Readings from Last 3 Encounters:  07/26/17 247 lb 9.6 oz (112.3 kg)  06/22/17 246 lb 1.6 oz (111.6 kg)  04/26/17 244 lb 6.4 oz (110.9 kg)      Studies/Labs Reviewed:    EKG:   EKG was not ordered today  Recent Labs: 04/19/2017: ALT 15; Magnesium 2.0; TSH 0.421 06/12/2017: BUN 40; Creatinine, Ser 7.10; Hemoglobin 11.1; Platelets 247; Potassium 4.3; Sodium 140   Lipid Panel    Component Value Date/Time   CHOL 171 11/25/2015 1616   TRIG 107 11/25/2015 1616   HDL 37 (L) 11/25/2015 1616   CHOLHDL 4.6 11/25/2015 1616   VLDL 21 11/25/2015 1616   LDLCALC 113 11/25/2015 1616    Additional studies/ records that were reviewed today include: Summarized above.   ASSESSMENT & PLAN:   1. Acute on chronic systolic CHF/NICM -  1. Continue to same medical management  2. He is mildly fluid overloaded, we will restart Lasix 40 mg daily  3. ICD is functioning properly 2. Mild CAD - no recent angina. Continue present regimen with aspirin, carvedilol and Crestor. He is tolerating it well. 3. Essential HTN - as about 4. Pulmonary HTN - this has been felt 2/2 CHF. Follow up pulmonary pressures by repeat echo. 5. 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  We will give him work note, with limitation up to 10 pounds until July 3 and afterwards no restrictions.  Follow-up in 4 months.  Medication Adjustments/Labs and Tests Ordered: Current medicines are reviewed at length with the patient today.   Concerns regarding medicines are outlined above. Medication changes, Labs and Tests ordered today are summarized above and listed in the Patient Instructions accessible in Encounters.   Signed, Ena Dawley, MD  07/26/2017 11:41 AM    [address], [place], [place]  [address] Phone: [phone]; Fax: [phone]

## 2017-07-26 NOTE — Patient Instructions (Signed)
Medication Instructions:   START TAKING LASIX 40 MG BY MOUTH DAILY     Follow-Up:  WITH DR Meda Coffee ON 11/29/17 AT 4:00 PM.       If you need a refill on your cardiac medications before your next appointment, please call your pharmacy.

## 2017-07-27 DIAGNOSIS — N186 End stage renal disease: Secondary | ICD-10-CM | POA: Diagnosis not present

## 2017-07-27 DIAGNOSIS — N2581 Secondary hyperparathyroidism of renal origin: Secondary | ICD-10-CM | POA: Diagnosis not present

## 2017-07-30 DIAGNOSIS — N2581 Secondary hyperparathyroidism of renal origin: Secondary | ICD-10-CM | POA: Diagnosis not present

## 2017-07-30 DIAGNOSIS — N186 End stage renal disease: Secondary | ICD-10-CM | POA: Diagnosis not present

## 2017-07-31 ENCOUNTER — Other Ambulatory Visit (HOSPITAL_COMMUNITY): Payer: BLUE CROSS/BLUE SHIELD

## 2017-07-31 DIAGNOSIS — R0989 Other specified symptoms and signs involving the circulatory and respiratory systems: Secondary | ICD-10-CM

## 2017-08-01 DIAGNOSIS — N186 End stage renal disease: Secondary | ICD-10-CM | POA: Diagnosis not present

## 2017-08-01 DIAGNOSIS — N2581 Secondary hyperparathyroidism of renal origin: Secondary | ICD-10-CM | POA: Diagnosis not present

## 2017-08-03 DIAGNOSIS — N186 End stage renal disease: Secondary | ICD-10-CM | POA: Diagnosis not present

## 2017-08-03 DIAGNOSIS — N2581 Secondary hyperparathyroidism of renal origin: Secondary | ICD-10-CM | POA: Diagnosis not present

## 2017-08-06 DIAGNOSIS — N186 End stage renal disease: Secondary | ICD-10-CM | POA: Diagnosis not present

## 2017-08-06 DIAGNOSIS — N2581 Secondary hyperparathyroidism of renal origin: Secondary | ICD-10-CM | POA: Diagnosis not present

## 2017-08-07 ENCOUNTER — Encounter (HOSPITAL_COMMUNITY): Payer: Self-pay | Admitting: Radiology

## 2017-08-08 DIAGNOSIS — N2581 Secondary hyperparathyroidism of renal origin: Secondary | ICD-10-CM | POA: Diagnosis not present

## 2017-08-08 DIAGNOSIS — N186 End stage renal disease: Secondary | ICD-10-CM | POA: Diagnosis not present

## 2017-08-09 IMAGING — CR DG ABDOMEN ACUTE W/ 1V CHEST
5 series · 5 of 5 positions shown · non-contrast
Comparison: None.

CLINICAL DATA: Hematemesis. Nausea and vomiting for 1 week.
Prostate cancer.

EXAM:
DG ABDOMEN ACUTE W/ 1V CHEST

[chest pa]
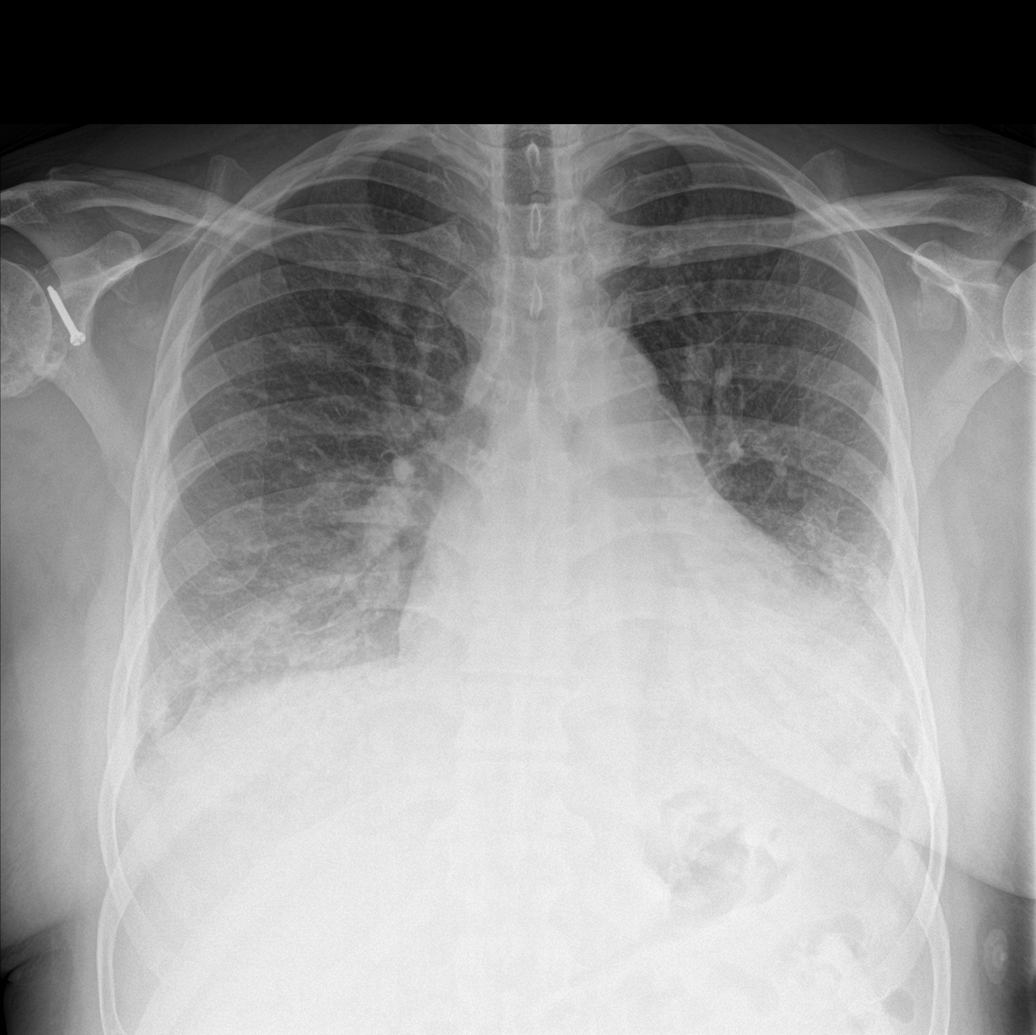

[abdomen erect (1 of 2)]
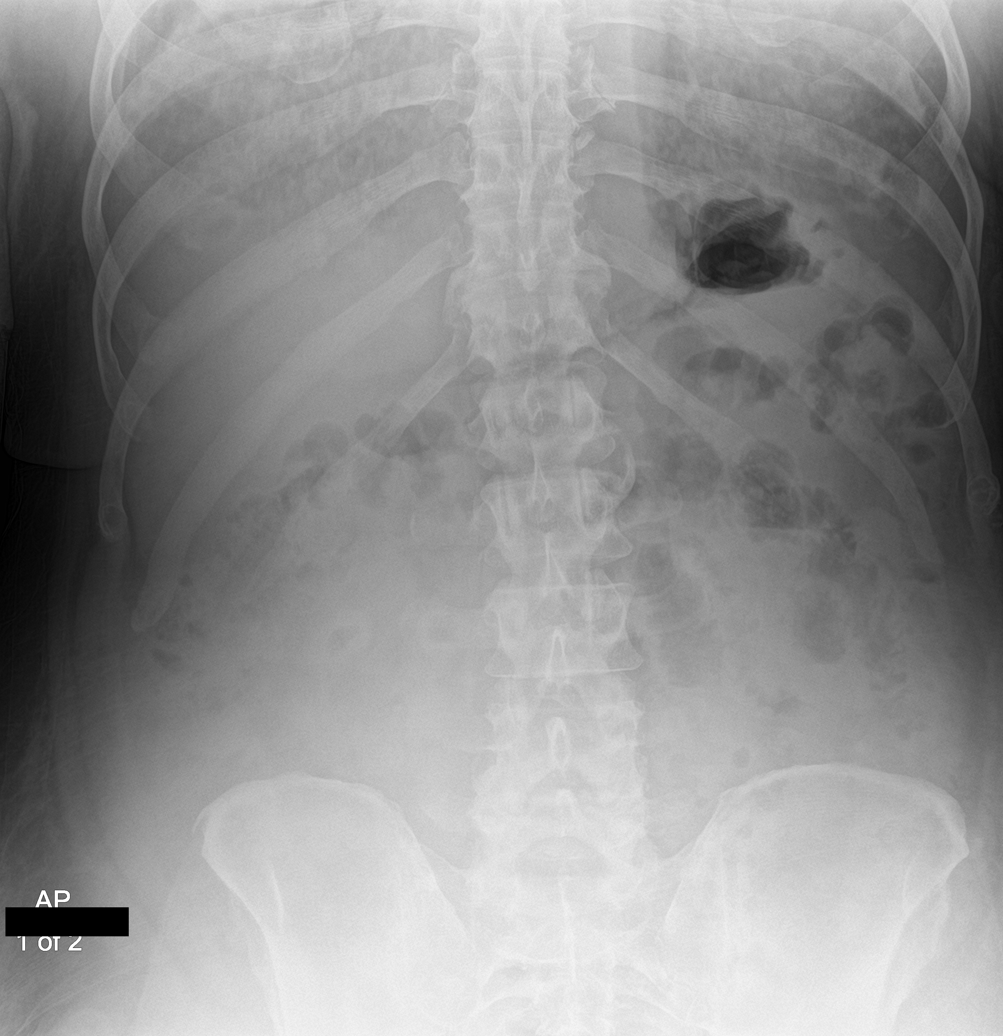

[abdomen supine (1 of 2)]
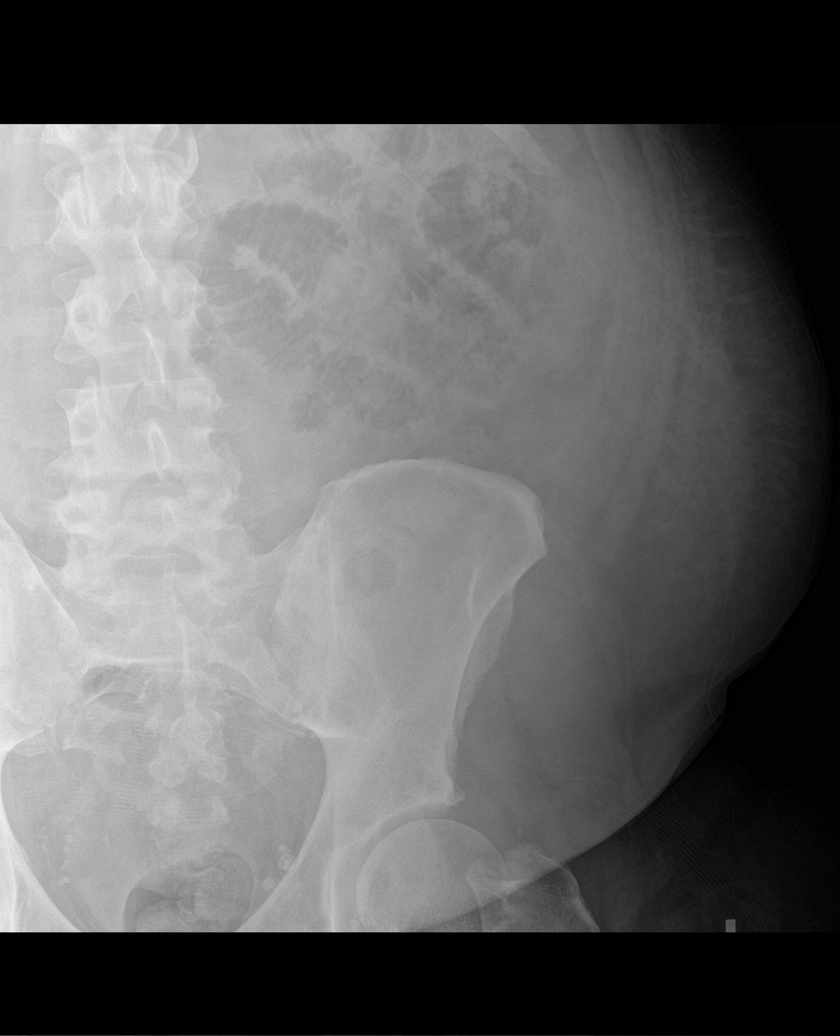

[abdomen supine (2 of 2)]
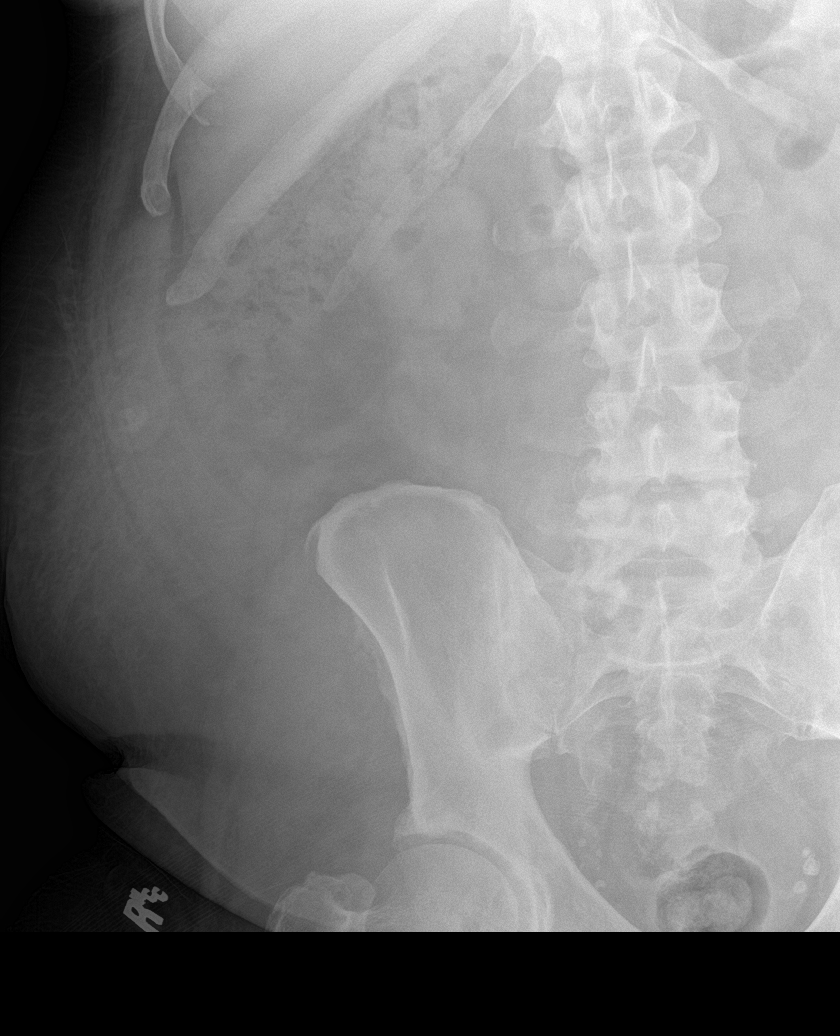

[abdomen erect (2 of 2)]
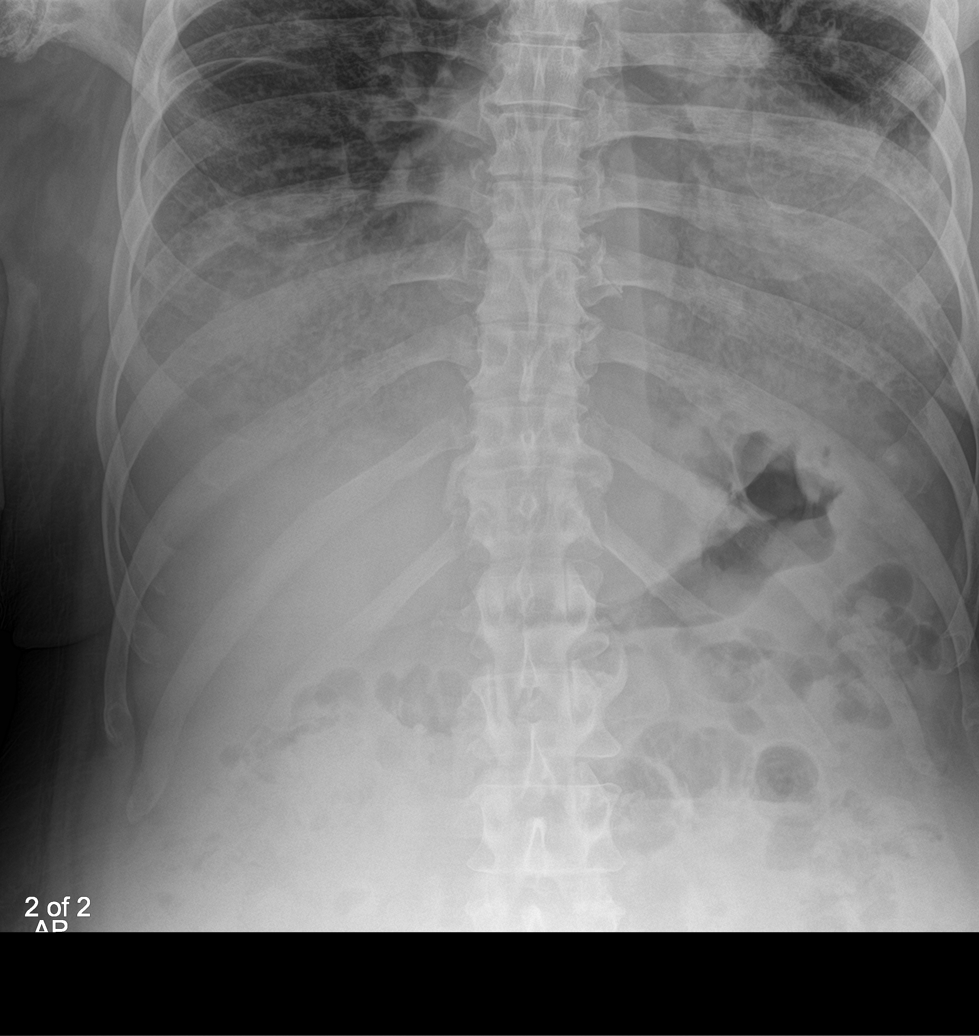

[5 of 5 positions shown; findings below may reference images not displayed]

FINDINGS: Heart is enlarged. Bilateral edema is present. Interstitial and
airspace disease is noted at the lung bases bilaterally.

Multiple loops of small bowel are noted in the left abdomen. Fluid
levels are present without significant distention. The axial
skeleton demonstrates degenerative change.
IMPRESSION: 1. Cardiomegaly with interstitial and airspace disease suggesting
edema and congestive heart failure.
2. Bibasilar airspace disease likely reflects atelectasis. Lower
lobe pneumonia is not excluded.
3. Loops of small bowel without distention in the left abdomen
likely reflect a focal ileus.
4. No obstruction or free air.

## 2017-08-12 DIAGNOSIS — Z992 Dependence on renal dialysis: Secondary | ICD-10-CM | POA: Diagnosis not present

## 2017-08-12 DIAGNOSIS — N186 End stage renal disease: Secondary | ICD-10-CM | POA: Diagnosis not present

## 2017-08-12 DIAGNOSIS — E1129 Type 2 diabetes mellitus with other diabetic kidney complication: Secondary | ICD-10-CM | POA: Diagnosis not present

## 2017-08-20 DIAGNOSIS — N2581 Secondary hyperparathyroidism of renal origin: Secondary | ICD-10-CM | POA: Diagnosis not present

## 2017-08-20 DIAGNOSIS — N186 End stage renal disease: Secondary | ICD-10-CM | POA: Diagnosis not present

## 2017-08-22 ENCOUNTER — Telehealth: Payer: Self-pay | Admitting: *Deleted

## 2017-08-22 DIAGNOSIS — N186 End stage renal disease: Secondary | ICD-10-CM | POA: Diagnosis not present

## 2017-08-22 DIAGNOSIS — N2581 Secondary hyperparathyroidism of renal origin: Secondary | ICD-10-CM | POA: Diagnosis not present

## 2017-08-22 NOTE — Telephone Encounter (Signed)
Will send this message to Dr Meda Coffee as an Juluis Rainier, via staff messaging system.

## 2017-08-22 NOTE — Telephone Encounter (Signed)
-----   Message from Lenard Galloway V sent at 08/22/2017  3:33 PM EDT ----- Regarding: Echocardiogram Just an FYI. We have made several attempts to contact this patient including sending a letter to schedule or reschedule their echocardiogram. We will be removing the patient from the echo WQ.   Thank you

## 2017-08-24 DIAGNOSIS — N2581 Secondary hyperparathyroidism of renal origin: Secondary | ICD-10-CM | POA: Diagnosis not present

## 2017-08-24 DIAGNOSIS — N186 End stage renal disease: Secondary | ICD-10-CM | POA: Diagnosis not present

## 2017-08-27 DIAGNOSIS — N186 End stage renal disease: Secondary | ICD-10-CM | POA: Diagnosis not present

## 2017-08-27 DIAGNOSIS — N2581 Secondary hyperparathyroidism of renal origin: Secondary | ICD-10-CM | POA: Diagnosis not present

## 2017-08-31 ENCOUNTER — Telehealth: Payer: Self-pay | Admitting: *Deleted

## 2017-08-31 ENCOUNTER — Telehealth: Payer: Self-pay | Admitting: Cardiology

## 2017-08-31 DIAGNOSIS — N2581 Secondary hyperparathyroidism of renal origin: Secondary | ICD-10-CM | POA: Diagnosis not present

## 2017-08-31 DIAGNOSIS — N186 End stage renal disease: Secondary | ICD-10-CM | POA: Diagnosis not present

## 2017-08-31 DIAGNOSIS — I2510 Atherosclerotic heart disease of native coronary artery without angina pectoris: Secondary | ICD-10-CM

## 2017-08-31 DIAGNOSIS — E1159 Type 2 diabetes mellitus with other circulatory complications: Secondary | ICD-10-CM

## 2017-08-31 DIAGNOSIS — I10 Essential (primary) hypertension: Principal | ICD-10-CM

## 2017-08-31 DIAGNOSIS — I5022 Chronic systolic (congestive) heart failure: Secondary | ICD-10-CM

## 2017-08-31 DIAGNOSIS — I110 Hypertensive heart disease with heart failure: Secondary | ICD-10-CM

## 2017-08-31 DIAGNOSIS — I428 Other cardiomyopathies: Secondary | ICD-10-CM

## 2017-08-31 NOTE — Telephone Encounter (Signed)
New order for echo has been placed on this pt.  Pt is calling in now to schedule his overdue echo. Order placed and [employer], is on the phone now with the pt making this appt.

## 2017-08-31 NOTE — Telephone Encounter (Signed)
Attempted to call the pt back on both contact numbers listed and no answer on each, and VM was full. This was to endorse to the pt that per Dr Meda Coffee, we will write his return to work note after his echo is complete and resulted.

## 2017-08-31 NOTE — Telephone Encounter (Signed)
New Message  Pt is calling wanting to know if he can come by today to get his note signed that he has no restrictions and can return back to work. Please call

## 2017-08-31 NOTE — Telephone Encounter (Signed)
Spoke with the pt and informed him that per Dr Meda Coffee, we will write his full return to work note after his gets his echo done, and based on those results. Pt states that he needs to drop off forms from his employer, for Dr Meda Coffee to review and advise on.  Advised the pt that given his echo is on this coming up Monday 09/03/17 at 2 pm, he should come to the office today, and drop his forms off, so they can initiate this process through our medical records dept. Informed the pt that when he drops his forms off today, he should request to speak with Maudie Mercury in Medical records, so she can collect these forms, explain the process of getting these forms completed, and have everything in place and ready once echo is complete.  Did advise the pt that with work forms/fmla forms, this is a bit of a process.  Informed the pt that Maudie Mercury will explain this more in detail, once the forms are dropped off. Advised the pt to be compliant with showing up for his echo on Monday 7/22 at 2 pm.  Advised the pt to arrive 20-30 mins prior to that appt.  Pt verbalized understanding and agrees with this plan

## 2017-08-31 NOTE — Telephone Encounter (Signed)
Pt dropped off RTW paper. Placed on Ivy's Desk. Also spoke with patient when he came in he is aware paper/Note will not be wrote until after his Echo on Monday.

## 2017-08-31 NOTE — Telephone Encounter (Signed)
Dr. Meda Coffee, this pt is now rescheduling his echo you ordered for him to have done back in June, at his last OV.  He cancelled multiple times and has now agreed to reschedule and its in appts. You took him out of work and wrote him a limitation work note, until his work-up was complete. Work-up has not been completed (because needs echo done), and he is now asking for a full return to work note written by you. Please advise!

## 2017-08-31 NOTE — Telephone Encounter (Signed)
We will write it after echo

## 2017-09-03 ENCOUNTER — Other Ambulatory Visit: Payer: Self-pay

## 2017-09-03 ENCOUNTER — Ambulatory Visit (HOSPITAL_COMMUNITY): Payer: BLUE CROSS/BLUE SHIELD | Attending: Cardiology

## 2017-09-03 DIAGNOSIS — E1159 Type 2 diabetes mellitus with other circulatory complications: Secondary | ICD-10-CM

## 2017-09-03 DIAGNOSIS — I2510 Atherosclerotic heart disease of native coronary artery without angina pectoris: Secondary | ICD-10-CM | POA: Diagnosis not present

## 2017-09-03 DIAGNOSIS — N186 End stage renal disease: Secondary | ICD-10-CM | POA: Diagnosis not present

## 2017-09-03 DIAGNOSIS — I10 Essential (primary) hypertension: Secondary | ICD-10-CM

## 2017-09-03 DIAGNOSIS — E1122 Type 2 diabetes mellitus with diabetic chronic kidney disease: Secondary | ICD-10-CM | POA: Diagnosis not present

## 2017-09-03 DIAGNOSIS — N189 Chronic kidney disease, unspecified: Secondary | ICD-10-CM | POA: Insufficient documentation

## 2017-09-03 DIAGNOSIS — I5022 Chronic systolic (congestive) heart failure: Secondary | ICD-10-CM | POA: Diagnosis not present

## 2017-09-03 DIAGNOSIS — I110 Hypertensive heart disease with heart failure: Secondary | ICD-10-CM

## 2017-09-03 DIAGNOSIS — I130 Hypertensive heart and chronic kidney disease with heart failure and stage 1 through stage 4 chronic kidney disease, or unspecified chronic kidney disease: Secondary | ICD-10-CM | POA: Insufficient documentation

## 2017-09-03 DIAGNOSIS — I428 Other cardiomyopathies: Secondary | ICD-10-CM | POA: Insufficient documentation

## 2017-09-03 DIAGNOSIS — N2581 Secondary hyperparathyroidism of renal origin: Secondary | ICD-10-CM | POA: Diagnosis not present

## 2017-09-03 DIAGNOSIS — E785 Hyperlipidemia, unspecified: Secondary | ICD-10-CM | POA: Insufficient documentation

## 2017-09-03 DIAGNOSIS — I081 Rheumatic disorders of both mitral and tricuspid valves: Secondary | ICD-10-CM | POA: Insufficient documentation

## 2017-09-04 ENCOUNTER — Encounter: Payer: Self-pay | Admitting: Cardiology

## 2017-09-05 ENCOUNTER — Telehealth: Payer: Self-pay | Admitting: Cardiology

## 2017-09-05 DIAGNOSIS — N186 End stage renal disease: Secondary | ICD-10-CM | POA: Diagnosis not present

## 2017-09-05 DIAGNOSIS — N2581 Secondary hyperparathyroidism of renal origin: Secondary | ICD-10-CM | POA: Diagnosis not present

## 2017-09-05 NOTE — Telephone Encounter (Signed)
Attempted to call the pt back and no answer, and VM was full.

## 2017-09-05 NOTE — Telephone Encounter (Signed)
New Message   Patient is returning call in reference to echo results. Please call.

## 2017-09-06 ENCOUNTER — Other Ambulatory Visit (HOSPITAL_COMMUNITY): Payer: BLUE CROSS/BLUE SHIELD

## 2017-09-06 NOTE — Telephone Encounter (Signed)
Notified the pt of his echo results and recommendations per Dr Turner/covering for Dr Meda Coffee.  This echo will also need to be forwarded to Dr Meda Coffee to review and advise on, along with his fmla papers in her folder, for this was apart of the work-up piece for this pt, to be able to advise on his work duties/restrictions. Pt is aware that Dr Meda Coffee is out of the office until 8/5, and this echo along with his work forms in her folder, will be addressed at that time. Pt states his job is giving him issues with the last work-note written by Dr Meda Coffee.  Informed the pt that once Dr Meda Coffee is able to view this echo and complete his work forms, he will shortly be notified thereafter.  Pt verbalized understanding and agrees with this plan.

## 2017-09-06 NOTE — Telephone Encounter (Signed)
Attempted to call the pt back and no answer and VM was full.  3rd attempt at trying to make contact with the pt.

## 2017-09-06 NOTE — Telephone Encounter (Signed)
Please send this to Dr. Meda Coffee as a staff message

## 2017-09-06 NOTE — Telephone Encounter (Signed)
Spoke back with the pt and Janelle HR Representative at [employer] (pt gave verbal consent to speak with Janelle), and informed them both that the pts form is in Dr [employer] folder to fill out and complete upon return to the office.  Informed both parties that she will be working on August 5 and that's when the form can be completed and returned  back to the pt.  HR representative Angus Palms states that without that updated form completed by Dr Meda Coffee, the letter she wrote for the pt on 6/13 does not support enough information for the pt to return to his normal work duties.  Per Angus Palms, the letter written is supportive, but the physical forms have to be filled out as well, before the company can completely release the pt to work back to his full duties.  Per Angus Palms, she will need to talk to her boss to see if the pt can work under light duty, until Dr Meda Coffee can advise on those forms when she returns to the office. Angus Palms states that they may let him work light duty, or he may have to go home under fmla, until Dr Meda Coffee can review and sign the needed forms.  Informed Janelle that I will route this message to Dr Meda Coffee to have and review upon return to work on 8/5, so that she can review his echo, sign his work forms, and pt can be called to come and pick the forms up thereafter. Janelle verbalized understanding and agrees with this plan.

## 2017-09-07 DIAGNOSIS — N186 End stage renal disease: Secondary | ICD-10-CM | POA: Diagnosis not present

## 2017-09-07 DIAGNOSIS — N2581 Secondary hyperparathyroidism of renal origin: Secondary | ICD-10-CM | POA: Diagnosis not present

## 2017-09-10 DIAGNOSIS — N186 End stage renal disease: Secondary | ICD-10-CM | POA: Diagnosis not present

## 2017-09-10 DIAGNOSIS — N2581 Secondary hyperparathyroidism of renal origin: Secondary | ICD-10-CM | POA: Diagnosis not present

## 2017-09-12 DIAGNOSIS — N2581 Secondary hyperparathyroidism of renal origin: Secondary | ICD-10-CM | POA: Diagnosis not present

## 2017-09-12 DIAGNOSIS — E1129 Type 2 diabetes mellitus with other diabetic kidney complication: Secondary | ICD-10-CM | POA: Diagnosis not present

## 2017-09-12 DIAGNOSIS — Z992 Dependence on renal dialysis: Secondary | ICD-10-CM | POA: Diagnosis not present

## 2017-09-12 DIAGNOSIS — N186 End stage renal disease: Secondary | ICD-10-CM | POA: Diagnosis not present

## 2017-09-14 DIAGNOSIS — N186 End stage renal disease: Secondary | ICD-10-CM | POA: Diagnosis not present

## 2017-09-14 DIAGNOSIS — N2581 Secondary hyperparathyroidism of renal origin: Secondary | ICD-10-CM | POA: Diagnosis not present

## 2017-09-17 ENCOUNTER — Other Ambulatory Visit: Payer: Self-pay | Admitting: Physician Assistant

## 2017-09-17 ENCOUNTER — Other Ambulatory Visit: Payer: Self-pay | Admitting: Family Medicine

## 2017-09-17 ENCOUNTER — Telehealth: Payer: Self-pay

## 2017-09-17 DIAGNOSIS — N186 End stage renal disease: Secondary | ICD-10-CM | POA: Diagnosis not present

## 2017-09-17 DIAGNOSIS — N2581 Secondary hyperparathyroidism of renal origin: Secondary | ICD-10-CM | POA: Diagnosis not present

## 2017-09-17 DIAGNOSIS — E785 Hyperlipidemia, unspecified: Secondary | ICD-10-CM

## 2017-09-17 MED ORDER — COLCHICINE 0.6 MG PO TABS
0.6000 mg | ORAL_TABLET | Freq: Every day | ORAL | 1 refills | Status: DC
Start: 2017-09-17 — End: 2017-11-05

## 2017-09-17 NOTE — Telephone Encounter (Signed)
LVM for return call. 

## 2017-09-17 NOTE — Telephone Encounter (Signed)
Called patient back, to let him know Dr. Meda Coffee will fill out paper work on Wednesday. Informed patient someone would call him when paper work is ready. Patient verbalized understanding.

## 2017-09-17 NOTE — Telephone Encounter (Signed)
Pt calling to check up on documents that should be signed for a return to work. Will forward to Dr Meda Coffee for further review.

## 2017-09-17 NOTE — Telephone Encounter (Signed)
I will stop by in the clinic on Wednesday and fill out the form

## 2017-09-17 NOTE — Telephone Encounter (Signed)
Pharmacy sent a fax stating patient insurance will only cover [place] and [place]. Please advise this change.

## 2017-09-17 NOTE — Telephone Encounter (Signed)
New message   Patient returning call to see if his return to work form has been completed. Please advise.

## 2017-09-19 ENCOUNTER — Telehealth: Payer: Self-pay | Admitting: Cardiology

## 2017-09-19 DIAGNOSIS — N186 End stage renal disease: Secondary | ICD-10-CM | POA: Diagnosis not present

## 2017-09-19 DIAGNOSIS — N2581 Secondary hyperparathyroidism of renal origin: Secondary | ICD-10-CM | POA: Diagnosis not present

## 2017-09-19 NOTE — Telephone Encounter (Signed)
F/U Message         Patient returned your call, he states his job keeps asking him about this. Pls call again

## 2017-09-19 NOTE — Telephone Encounter (Signed)
Tried to reach patient, but no answer and mailbox full.

## 2017-09-19 NOTE — Telephone Encounter (Signed)
Spoke to patient and informed him that the paper has been signed and ready for pickup.  He is on his way to pick it up.

## 2017-09-19 NOTE — Telephone Encounter (Signed)
Patient is inquiring about paperwork which is supposed to be signed today. Please advise, thank you.

## 2017-09-19 NOTE — Telephone Encounter (Signed)
Follow Up:    Pt checking on the status of his paperwork.

## 2017-09-20 DIAGNOSIS — E113512 Type 2 diabetes mellitus with proliferative diabetic retinopathy with macular edema, left eye: Secondary | ICD-10-CM | POA: Diagnosis not present

## 2017-09-21 DIAGNOSIS — N186 End stage renal disease: Secondary | ICD-10-CM | POA: Diagnosis not present

## 2017-09-21 DIAGNOSIS — N2581 Secondary hyperparathyroidism of renal origin: Secondary | ICD-10-CM | POA: Diagnosis not present

## 2017-09-24 DIAGNOSIS — N186 End stage renal disease: Secondary | ICD-10-CM | POA: Diagnosis not present

## 2017-09-24 DIAGNOSIS — N2581 Secondary hyperparathyroidism of renal origin: Secondary | ICD-10-CM | POA: Diagnosis not present

## 2017-09-26 ENCOUNTER — Encounter (INDEPENDENT_AMBULATORY_CARE_PROVIDER_SITE_OTHER): Payer: Self-pay

## 2017-09-26 ENCOUNTER — Encounter: Payer: Self-pay | Admitting: Internal Medicine

## 2017-09-26 ENCOUNTER — Ambulatory Visit (INDEPENDENT_AMBULATORY_CARE_PROVIDER_SITE_OTHER): Payer: BLUE CROSS/BLUE SHIELD | Admitting: Internal Medicine

## 2017-09-26 VITALS — BP 136/72 | HR 92 | Ht 72.0 in | Wt 255.2 lb

## 2017-09-26 DIAGNOSIS — N186 End stage renal disease: Secondary | ICD-10-CM

## 2017-09-26 DIAGNOSIS — Z95810 Presence of automatic (implantable) cardiac defibrillator: Secondary | ICD-10-CM

## 2017-09-26 DIAGNOSIS — I5022 Chronic systolic (congestive) heart failure: Secondary | ICD-10-CM

## 2017-09-26 DIAGNOSIS — N2581 Secondary hyperparathyroidism of renal origin: Secondary | ICD-10-CM | POA: Diagnosis not present

## 2017-09-26 DIAGNOSIS — I428 Other cardiomyopathies: Secondary | ICD-10-CM | POA: Diagnosis not present

## 2017-09-26 LAB — CUP PACEART INCLINIC DEVICE CHECK
Implantable Lead Implant Date: 20190509
Implantable Lead Location: 753862
Implantable Lead Model: 3401
Implantable Lead Serial Number: 123518
Implantable Pulse Generator Implant Date: 20190509
MDC IDC SESS DTM: 20190814200124
Pulse Gen Serial Number: 243235

## 2017-09-26 MED ORDER — CARVEDILOL 6.25 MG PO TABS: 6.2500 mg | ORAL_TABLET | Freq: Two times a day (BID) | ORAL | 3 refills | Status: DC

## 2017-09-26 NOTE — Patient Instructions (Signed)
Medication Instructions:  Your physician recommends that you continue on your current medications as directed. Please refer to the Current Medication list given to you today.  Labwork: None ordered.  Testing/Procedures: None ordered.  Follow-Up: Your physician wants you to follow-up in: 9 months with Dr. Lovena Le.   You will receive a reminder letter in the mail two months in advance. If you don't receive a letter, please call our office to schedule the follow-up appointment.  Remote monitoring is used to monitor your ICD from home. This monitoring reduces the number of office visits required to check your device to one time per year. It allows [place] to keep an eye on the functioning of your device to ensure it is working properly. You are scheduled for a device check from home on 12/26/2017. You may send your transmission at any time that day. If you have a wireless device, the transmission will be sent automatically. After your physician reviews your transmission, you will receive a postcard with your next transmission date.  Any Other Special Instructions Will Be Listed Below (If Applicable).  If you need a refill on your cardiac medications before your next appointment, please call your pharmacy.

## 2017-09-26 NOTE — Progress Notes (Signed)
HPI Mr. Calk returns today for followup. He is a pleasant middle aged man with chronic systolic heart failure, ESRD on HD, EF 30%, and syncope. He had a Sub Q ICD insertion and received a shock. He was found to have T wave oversensing of his sinus tachycardia. His device was reprogrammed. He feels well. His incisions have healed up nicely. No Known Allergies   Current Outpatient Medications  Medication Sig Dispense Refill  . acetaminophen (TYLENOL) 500 MG tablet Take 1,000 mg by mouth daily as needed for moderate pain or headache.     Marland Kitchen amLODipine (NORVASC) 5 MG tablet Take 5 mg by mouth daily.    Marland Kitchen aspirin 81 MG tablet Take 81 mg by mouth daily.    . calcitRIOL (ROCALTROL) 0.25 MCG capsule Take 0.25 mcg by mouth daily.    . calcium acetate (PHOSLO) 667 MG capsule Take 2 capsules (1,334 mg total) by mouth 3 (three) times daily with meals. (Patient taking differently: Take 2,001 mg by mouth 3 (three) times daily with meals. ) 90 capsule 0  . carvedilol (COREG) 6.25 MG tablet Take 1 tablet (6.25 mg total) by mouth 2 (two) times daily. 180 tablet 3  . Cholecalciferol (VITAMIN D PO) Take 1 tablet by mouth daily.    . cloNIDine (CATAPRES) 0.2 MG tablet Take 0.2 mg by mouth at bedtime.    . colchicine 0.6 MG tablet Take 1 tablet (0.6 mg total) by mouth daily. 90 tablet 1  . fish oil-omega-3 fatty acids 1000 MG capsule Take 1 g by mouth daily.    . furosemide (LASIX) 40 MG tablet Take 1 tablet (40 mg total) by mouth daily. 90 tablet 3  . hydrALAZINE (APRESOLINE) 25 MG tablet TAKE 1 TABLET BY MOUTH THREE TIMES DAILY 90 tablet 11  . insulin detemir (LEVEMIR) 100 UNIT/ML injection Inject 0.1 mLs (10 Units total) into the skin 2 (two) times daily. 20 mL 0  . insulin lispro (HUMALOG) 100 UNIT/ML injection Inject 7 Units into the skin 3 (three) times daily before meals.    . isosorbide mononitrate (IMDUR) 30 MG 24 hr tablet Take 0.5 tablets (15 mg total) daily by mouth. 90 tablet 3  .  ivabradine (CORLANOR) 5 MG TABS tablet Take 1 tablet (5 mg total) by mouth 2 (two) times daily with a meal. 180 tablet 2  . Magnesium 250 MG TABS Take 250 mg by mouth daily.     . Misc Natural Products (TART CHERRY ADVANCED PO) Take 1 tablet by mouth daily.     . Multiple Minerals (CALCIUM/MAGNESIUM/ZINC PO) Take 1 tablet by mouth daily.     . Multiple Vitamin (MULTIVITAMIN WITH MINERALS) TABS Take 1 tablet by mouth daily.    . Multiple Vitamins-Minerals (AIRBORNE) CHEW Chew 1 tablet by mouth daily.    . rosuvastatin (CRESTOR) 20 MG tablet TAKE 1 TABLET BY MOUTH ONCE DAILY 30 tablet 0  . vitamin B-12 (CYANOCOBALAMIN) 1000 MCG tablet Take 1,000 mcg by mouth daily.    Marland Kitchen VITAMIN E PO Take 1 capsule by mouth daily.    . traMADol (ULTRAM) 50 MG tablet Take 1 tablet by mouth as needed for pain.     No current facility-administered medications for this visit.      Past Medical History:  Diagnosis Date  . Anemia   . CAD (coronary artery disease) 09/12/2016   R/L heart cath 09/01/16:  nl LAD, pLCx 25, pRCA 25, EF 25-35; Mean RA 8, PASP 60, mean PA  43, LVEDP 29  . Chronic systolic CHF (congestive heart failure) ([place] [place])    Echo 12/17: EF 40-45 // L HC 7/18: EF 25-35, LVEDP 29  . Chronic systolic heart failure ([place]) 06/21/2017  . Coronary artery disease involving native coronary artery of native heart without angina pectoris 09/12/2016   R/L heart cath 09/01/16:  nl LAD, pLCx 25, pRCA 25, EF 25-35; Mean RA 8, PASP 60, mean PA 43, LVEDP 29  . Diabetes mellitus   . Diabetic retinopathy associated with type 2 diabetes mellitus ([place]) 12/17/2012  . Dyspnea 07/23/2016  . ED (erectile dysfunction)   . ESRD on dialysis ([place])   . ESRD on hemodialysis ([place])    a. started HD 07/2016  . GERD (gastroesophageal reflux disease)    occasional  . Hyperlipidemia   . Hyperlipidemia LDL goal <70 03/27/2011  . Hypertension    [name]  . Hypertension associated with diabetes ([place]) 03/27/2011  . Hypertensive  heart disease with chronic systolic congestive heart failure ([place]) 09/12/2016  . NICM (nonischemic cardiomyopathy) ([place])    a. Echo 12/17-mod LVH, EF 40-45, diff HK, diast+syst flattening, RVH, mild LAE/RAE, mild MR, PASP 54mmHg //  Nuclear stress test 01/2016: EF 42, no ischemia. // R/L Ht Cath 7/18: min cor plaque; elev LVEDP, elev PASP >> Echo 08/2017 with improvement in PASP (63mmHg>>52mmHg) and no change in EF at 30-35%  . Obesity   . Peripheral neuropathy    neuropathy, "tingling in feet"  . 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 PPD positive, treated   . Prostate cancer ([place])   . Pulmonary hypertension ([place])    severe PAH by 01/2016 echo  . Pulmonary hypertension ([place])    a. PASP 94 by echo 01/2016. b. After initiation of dialysis, cath 08/2016 showed moderate pulm HTN.  Marland Kitchen Renal failure 06/10/2013  . Renal insufficiency 01/11/2012  . Type 2 diabetes mellitus with stage 5 chronic kidney disease not on chronic dialysis, with long-term current use of insulin ([place]) 03/27/2011  . Ureteritis 06/10/2013    ROS:   All systems reviewed and negative except as noted in the HPI.   Past Surgical History:  Procedure Laterality Date  . AMPUTATION  01/26/2012   Procedure: AMPUTATION RAY;  Surgeon: Newt Minion, MD;  Location: [facility];  Service: Orthopedics;  Laterality: Right;  Right foot 2nd ray amputation  . AV FISTULA PLACEMENT Left 10/09/2014   Procedure: CREATION OF LEFT RADIOCEPHALIC ARTERIOVENOUS (AV) FISTULA ;  Surgeon: Serafina Mitchell, MD;  Location: [place];  Service: Vascular;  Laterality: Left;  . COLONOSCOPY    . FRACTURE SURGERY     shoulder surgery  . KNEE CARTILAGE SURGERY    . PARS PLANA VITRECTOMY Left 12/17/2012   Procedure: LEFT PARS PLANA VITRECTOMY WITH 25 GAUGE/ENDO LASER;  Surgeon: Hurman Horn, MD;  Location: [facility];  Service: Ophthalmology;  Laterality: Left;  . PARS PLANA VITRECTOMY Right 02/05/2016   Procedure: PARS PLANA VITRECTOMY WITH 25 GAUGE;  Surgeon: Jalene Mullet, MD;   Location: [facility];  Service: Ophthalmology;  Laterality: Right;  with block  . PHOTOCOAGULATION WITH LASER Left 12/17/2012   Procedure: PHOTOCOAGULATION WITH LASER;  Surgeon: Hurman Horn, MD;  Location: [place];  Service: Ophthalmology;  Laterality: Left;  . PROSTATECTOMY  2011  . RIGHT/LEFT HEART CATH AND CORONARY ANGIOGRAPHY N/A 09/01/2016   Procedure: Right/Left Heart Cath and Coronary Angiography;  Surgeon: Martinique, Peter M, MD;  Location: [facility];  Service: Cardiovascular;  Laterality:  N/A;  . SUBQ ICD IMPLANT N/A 06/21/2017   Procedure: SUBQ ICD IMPLANT;  Surgeon: Evans Lance, MD;  Location: [facility];  Service: Cardiovascular;  Laterality: N/A;  . VASECTOMY       Family History  Problem Relation Age of Onset  . Diabetes Mother   . Diabetes Father      Social History   Socioeconomic History  . Marital status: Married    Spouse name: Not on file  . Number of children: 4  . Years of education: Not on file  . Highest education level: Not on file  Occupational History  . Occupation: Office manager  . Financial resource strain: Not on file  . Food insecurity:    Worry: Not on file    Inability: Not on file  . Transportation needs:    Medical: Not on file    Non-medical: Not on file  Tobacco Use  . Smoking status: Never Smoker  . Smokeless tobacco: Never Used  Substance and Sexual Activity  . Alcohol use: No  . Drug use: No  . Sexual activity: Yes  Lifestyle  . Physical activity:    Days per week: Not on file    Minutes per session: Not on file  . Stress: Not on file  Relationships  . Social connections:    Talks on phone: Not on file    Gets together: Not on file    Attends religious service: Not on file    Active member of club or organization: Not on file    Attends meetings of clubs or organizations: Not on file    Relationship status: Not on file  . Intimate partner violence:    Fear of current or ex partner: Not on file     Emotionally abused: Not on file    Physically abused: Not on file    Forced sexual activity: Not on file  Other Topics Concern  . Not on file  Social History Narrative  . Not on file     BP 136/72   Pulse 92   Ht 6' (1.829 m)   Wt 255 lb 3.2 oz (115.8 kg)   SpO2 97%   BMI 34.61 kg/m   Physical Exam:  Well appearing 57 yo man, NAD HEENT: Unremarkable Neck:  6 cm JVD, no thyromegally Lymphatics:  No adenopathy Back:  No CVA tenderness Lungs:  Clear with no wheezes HEART:  Regular rate rhythm, no murmurs, no rubs, no clicks Abd:  soft, positive bowel sounds, no organomegally, no rebound, no guarding Ext:  2 plus pulses, no edema, no cyanosis, no clubbing Skin:  No rashes no nodules Neuro:  CN II through XII intact, motor grossly intact  EKG - nsr with lbbb  DEVICE  Normal device function.  See PaceArt for details.   Assess/Plan: 1. ICD - his sub q ICD has been reprogrammed and appears to be working normally.  2. Chronic systolic heart failure - his symptoms appear to be class 2 and he is euvolemic.  3. HTN - his blood pressure is fairly well controlled.  Mikle Bosworth.D.

## 2017-09-28 DIAGNOSIS — N2581 Secondary hyperparathyroidism of renal origin: Secondary | ICD-10-CM | POA: Diagnosis not present

## 2017-09-28 DIAGNOSIS — N186 End stage renal disease: Secondary | ICD-10-CM | POA: Diagnosis not present

## 2017-10-01 ENCOUNTER — Other Ambulatory Visit: Payer: Self-pay | Admitting: Internal Medicine

## 2017-10-01 DIAGNOSIS — N186 End stage renal disease: Secondary | ICD-10-CM | POA: Diagnosis not present

## 2017-10-01 DIAGNOSIS — N2581 Secondary hyperparathyroidism of renal origin: Secondary | ICD-10-CM | POA: Diagnosis not present

## 2017-10-02 ENCOUNTER — Ambulatory Visit: Payer: BLUE CROSS/BLUE SHIELD | Admitting: Gastroenterology

## 2017-10-02 ENCOUNTER — Encounter: Payer: Self-pay | Admitting: Gastroenterology

## 2017-10-02 VITALS — BP 100/56 | HR 76 | Ht 72.0 in | Wt 247.0 lb

## 2017-10-02 DIAGNOSIS — Z1212 Encounter for screening for malignant neoplasm of rectum: Secondary | ICD-10-CM

## 2017-10-02 DIAGNOSIS — I5022 Chronic systolic (congestive) heart failure: Secondary | ICD-10-CM

## 2017-10-02 DIAGNOSIS — Z1211 Encounter for screening for malignant neoplasm of colon: Secondary | ICD-10-CM

## 2017-10-02 MED ORDER — NA SULFATE-K SULFATE-MG SULF 17.5-3.13-1.6 GM/177ML PO SOLN: 1.0000 | Freq: Once | ORAL | 0 refills | Status: AC

## 2017-10-02 NOTE — Progress Notes (Signed)
History of Present Illness: This is a 56 year old male referred by Denita Lung, MD for consideration of a screening colonoscopy prior to renal transplant.  He does not recall having a colonoscopy.  He has no gastrointestinal complaints.  He has chronic systolic heart failure with an ejection fraction of 30 - 35%.  Denies weight loss, abdominal pain, constipation, diarrhea, change in stool caliber, melena, hematochezia, nausea, vomiting, dysphagia, reflux symptoms, chest pain.   No Known Allergies Outpatient Medications Prior to Visit  Medication Sig Dispense Refill  . acetaminophen (TYLENOL) 500 MG tablet Take 1,000 mg by mouth daily as needed for moderate pain or headache.     Marland Kitchen amLODipine (NORVASC) 5 MG tablet Take 5 mg by mouth daily.    Marland Kitchen aspirin 81 MG tablet Take 81 mg by mouth daily.    . calcitRIOL (ROCALTROL) 0.25 MCG capsule Take 0.25 mcg by mouth daily.    . calcium acetate (PHOSLO) 667 MG capsule Take 2 capsules (1,334 mg total) by mouth 3 (three) times daily with meals. (Patient taking differently: Take 2,001 mg by mouth 3 (three) times daily with meals. ) 90 capsule 0  . carvedilol (COREG) 6.25 MG tablet Take 1 tablet (6.25 mg total) by mouth 2 (two) times daily. 180 tablet 3  . Cholecalciferol (VITAMIN D PO) Take 1 tablet by mouth daily.    . cloNIDine (CATAPRES) 0.2 MG tablet Take 0.2 mg by mouth at bedtime.    . colchicine 0.6 MG tablet Take 1 tablet (0.6 mg total) by mouth daily. 90 tablet 1  . fish oil-omega-3 fatty acids 1000 MG capsule Take 1 g by mouth daily.    . furosemide (LASIX) 40 MG tablet Take 1 tablet (40 mg total) by mouth daily. 90 tablet 3  . hydrALAZINE (APRESOLINE) 25 MG tablet TAKE 1 TABLET BY MOUTH THREE TIMES DAILY 90 tablet 11  . insulin detemir (LEVEMIR) 100 UNIT/ML injection Inject 0.1 mLs (10 Units total) into the skin 2 (two) times daily. 20 mL 0  . insulin lispro (HUMALOG) 100 UNIT/ML injection Inject 7 Units into the skin 3 (three) times daily  before meals.    . isosorbide mononitrate (IMDUR) 30 MG 24 hr tablet Take 0.5 tablets (15 mg total) daily by mouth. 90 tablet 3  . ivabradine (CORLANOR) 5 MG TABS tablet Take 1 tablet (5 mg total) by mouth 2 (two) times daily with a meal. 180 tablet 2  . Magnesium 250 MG TABS Take 250 mg by mouth daily.     . Misc Natural Products (TART CHERRY ADVANCED PO) Take 1 tablet by mouth daily.     . Multiple Minerals (CALCIUM/MAGNESIUM/ZINC PO) Take 1 tablet by mouth daily.     . Multiple Vitamin (MULTIVITAMIN WITH MINERALS) TABS Take 1 tablet by mouth daily.    . rosuvastatin (CRESTOR) 20 MG tablet TAKE 1 TABLET BY MOUTH ONCE DAILY 30 tablet 0  . traMADol (ULTRAM) 50 MG tablet Take 1 tablet by mouth as needed for pain.    . vitamin B-12 (CYANOCOBALAMIN) 1000 MCG tablet Take 1,000 mcg by mouth daily.    Marland Kitchen VITAMIN E PO Take 1 capsule by mouth daily.    . Multiple Vitamins-Minerals (AIRBORNE) CHEW Chew 1 tablet by mouth daily.     No facility-administered medications prior to visit.    Past Medical History:  Diagnosis Date  . Anemia   . CAD (coronary artery disease) 09/12/2016   R/L heart cath 09/01/16:  nl LAD, pLCx 25, pRCA  25, EF 25-35; Mean RA 8, PASP 60, mean PA 43, LVEDP 29  . Chronic systolic CHF (congestive heart failure) ([place])    Echo 12/17: EF 40-45 // L HC 7/18: EF 25-35, LVEDP 29  . Chronic systolic heart failure ([place]) 06/21/2017  . Coronary artery disease involving native coronary artery of native heart without angina pectoris 09/12/2016   R/L heart cath 09/01/16:  nl LAD, pLCx 25, pRCA 25, EF 25-35; Mean RA 8, PASP 60, mean PA 43, LVEDP 29  . Diabetes mellitus   . Diabetic retinopathy associated with type 2 diabetes mellitus ([place]) 12/17/2012  . Dyspnea 07/23/2016  . ED (erectile dysfunction)   . ESRD on dialysis ([place])   . ESRD on hemodialysis ([place])    a. started HD 07/2016  . GERD (gastroesophageal reflux disease)    occasional  . Hyperlipidemia   . Hyperlipidemia LDL goal <70  03/27/2011  . Hypertension    [name]  . Hypertension associated with diabetes ([place]) 03/27/2011  . Hypertensive heart disease with chronic systolic congestive heart failure ([place]) 09/12/2016  . NICM (nonischemic cardiomyopathy) ([place])    a. Echo 12/17-mod LVH, EF 40-45, diff HK, diast+syst flattening, RVH, mild LAE/RAE, mild MR, PASP 42mmHg //  Nuclear stress test 01/2016: EF 42, no ischemia. // R/L Ht Cath 7/18: min cor plaque; elev LVEDP, elev PASP >> Echo 08/2017 with improvement in PASP (40mmHg>>52mmHg) and no change in EF at 30-35%  . Obesity   . Peripheral neuropathy    neuropathy, "tingling in feet"  . 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 PPD positive, treated   . Prostate cancer ([place])   . Pulmonary hypertension ([place])    severe PAH by 01/2016 echo  . Pulmonary hypertension ([place])    a. PASP 94 by echo 01/2016. b. After initiation of dialysis, cath 08/2016 showed moderate pulm HTN.  Marland Kitchen Renal failure 06/10/2013  . Renal insufficiency 01/11/2012  . 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 Ureteritis 06/10/2013   Past Surgical History:  Procedure Laterality Date  . AMPUTATION  01/26/2012   Procedure: AMPUTATION RAY;  Surgeon: Newt Minion, MD;  Location: [facility];  Service: Orthopedics;  Laterality: Right;  Right foot 2nd ray amputation  . AV FISTULA PLACEMENT Left 10/09/2014   Procedure: CREATION OF LEFT RADIOCEPHALIC ARTERIOVENOUS (AV) FISTULA ;  Surgeon: Serafina Mitchell, MD;  Location: [facility];  Service: Vascular;  Laterality: Left;  . COLONOSCOPY    . FRACTURE SURGERY     shoulder surgery  . KNEE CARTILAGE SURGERY    . PARS PLANA VITRECTOMY Left 12/17/2012   Procedure: LEFT PARS PLANA VITRECTOMY WITH 25 GAUGE/ENDO LASER;  Surgeon: Hurman Horn, MD;  Location: [facility];  Service: Ophthalmology;  Laterality: Left;  . PARS PLANA VITRECTOMY Right 02/05/2016   Procedure: PARS PLANA VITRECTOMY  WITH 25 GAUGE;  Surgeon: Jalene Mullet, MD;  Location: [facility];  Service: Ophthalmology;  Laterality: Right;  with block  . PHOTOCOAGULATION WITH LASER Left 12/17/2012   Procedure: PHOTOCOAGULATION WITH LASER;  Surgeon: Hurman Horn, MD;  Location: [facility];  Service: Ophthalmology;  Laterality: Left;  . PROSTATECTOMY  2011  . RIGHT/LEFT HEART CATH AND CORONARY ANGIOGRAPHY N/A 09/01/2016   Procedure: Right/Left Heart Cath and Coronary Angiography;  Surgeon: Martinique, Peter M, MD;  Location: [facility];  Service: Cardiovascular;  Laterality: N/A;  . SUBQ ICD IMPLANT N/A  06/21/2017   Procedure: SUBQ ICD IMPLANT;  Surgeon: Evans Lance, MD;  Location: [facility];  Service: Cardiovascular;  Laterality: N/A;  . VASECTOMY     Social History   Socioeconomic History  . Marital status: Married    Spouse name: Not on file  . Number of children: 4  . Years of education: Not on file  . Highest education level: Not on file  Occupational History  . Occupation: Office manager  . Financial resource strain: Not on file  . Food insecurity:    Worry: Not on file    Inability: Not on file  . Transportation needs:    Medical: Not on file    Non-medical: Not on file  Tobacco Use  . Smoking status: Never Smoker  . Smokeless tobacco: Never Used  Substance and Sexual Activity  . Alcohol use: No  . Drug use: No  . Sexual activity: Yes  Lifestyle  . Physical activity:    Days per week: Not on file    Minutes per session: Not on file  . Stress: Not on file  Relationships  . Social connections:    Talks on phone: Not on file    Gets together: Not on file    Attends religious service: Not on file    Active member of club or organization: Not on file    Attends meetings of clubs or organizations: Not on file    Relationship status: Not on file  Other Topics Concern  . Not on file  Social History Narrative  . Not on file   Family History  Problem Relation Age of Onset  .  Diabetes Mother   . Diabetes Father   . Colon cancer Neg Hx   . Rectal cancer Neg Hx   . Esophageal cancer Neg Hx        Review of Systems: Pertinent positive and negative review of systems were noted in the above HPI section. All other review of systems were otherwise negative.   Physical Exam: General: Well developed, well nourished, no acute distress Head: Normocephalic and atraumatic Eyes:  sclerae anicteric, EOMI Ears: Normal auditory acuity Mouth: No deformity or lesions Neck: Supple, no masses or thyromegaly Lungs: Clear throughout to auscultation Heart: Regular rate and rhythm; no murmurs, rubs or bruits Abdomen: Soft, non tender and non distended. No masses, hepatosplenomegaly or hernias noted. Normal Bowel sounds Rectal: Deferred to colonoscopy Musculoskeletal: Symmetrical with no gross deformities  Skin: No lesions on visible extremities Pulses:  Normal pulses noted Extremities: No clubbing, cyanosis, edema or deformities noted Neurological: Alert oriented x 4, grossly nonfocal Cervical Nodes:  No significant cervical adenopathy Inguinal Nodes: No significant inguinal adenopathy Psychological:  Alert and cooperative. Normal mood and affect  Assessment and Recommendations:  1.  CRC screening, average risk.  Schedule colonoscopy at hospital. The risks (including bleeding, perforation, infection, missed lesions, medication reactions and possible hospitalization or surgery if complications occur), benefits, and alternatives to colonoscopy with possible biopsy and possible polypectomy were discussed with the patient and they consent to proceed.    2. Chronic systolic heart failure with EF 30 - 35 %.  3. ESRD on HD.   4. DM.    cc: Denita Lung, [address] [place], [place] [address]

## 2017-10-02 NOTE — Patient Instructions (Signed)
You have been scheduled for a colonoscopy. Please follow written instructions given to you at your visit today.  Please pick up your prep supplies at the pharmacy within the next 1-3 days. If you use inhalers (even only as needed), please bring them with you on the day of your procedure. Your physician has requested that you go to www.startemmi.com and enter the access code given to you at your visit today. This web site gives a general overview about your procedure. However, you should still follow specific instructions given to you by our office regarding your preparation for the procedure.  Thank you for choosing me and [facility].  Malcolm T. Stark, Jr., MD., FACG  

## 2017-10-03 DIAGNOSIS — N2581 Secondary hyperparathyroidism of renal origin: Secondary | ICD-10-CM | POA: Diagnosis not present

## 2017-10-03 DIAGNOSIS — N186 End stage renal disease: Secondary | ICD-10-CM | POA: Diagnosis not present

## 2017-10-05 DIAGNOSIS — N2581 Secondary hyperparathyroidism of renal origin: Secondary | ICD-10-CM | POA: Diagnosis not present

## 2017-10-05 DIAGNOSIS — N186 End stage renal disease: Secondary | ICD-10-CM | POA: Diagnosis not present

## 2017-10-08 DIAGNOSIS — N2581 Secondary hyperparathyroidism of renal origin: Secondary | ICD-10-CM | POA: Diagnosis not present

## 2017-10-08 DIAGNOSIS — N186 End stage renal disease: Secondary | ICD-10-CM | POA: Diagnosis not present

## 2017-10-10 ENCOUNTER — Ambulatory Visit: Payer: BLUE CROSS/BLUE SHIELD | Admitting: Physician Assistant

## 2017-10-10 DIAGNOSIS — N2581 Secondary hyperparathyroidism of renal origin: Secondary | ICD-10-CM | POA: Diagnosis not present

## 2017-10-10 DIAGNOSIS — N186 End stage renal disease: Secondary | ICD-10-CM | POA: Diagnosis not present

## 2017-10-12 DIAGNOSIS — N186 End stage renal disease: Secondary | ICD-10-CM | POA: Diagnosis not present

## 2017-10-12 DIAGNOSIS — N2581 Secondary hyperparathyroidism of renal origin: Secondary | ICD-10-CM | POA: Diagnosis not present

## 2017-10-13 DIAGNOSIS — Z992 Dependence on renal dialysis: Secondary | ICD-10-CM | POA: Diagnosis not present

## 2017-10-13 DIAGNOSIS — E1129 Type 2 diabetes mellitus with other diabetic kidney complication: Secondary | ICD-10-CM | POA: Diagnosis not present

## 2017-10-13 DIAGNOSIS — N186 End stage renal disease: Secondary | ICD-10-CM | POA: Diagnosis not present

## 2017-10-15 DIAGNOSIS — N2581 Secondary hyperparathyroidism of renal origin: Secondary | ICD-10-CM | POA: Diagnosis not present

## 2017-10-15 DIAGNOSIS — N186 End stage renal disease: Secondary | ICD-10-CM | POA: Diagnosis not present

## 2017-10-17 DIAGNOSIS — N186 End stage renal disease: Secondary | ICD-10-CM | POA: Diagnosis not present

## 2017-10-17 DIAGNOSIS — N2581 Secondary hyperparathyroidism of renal origin: Secondary | ICD-10-CM | POA: Diagnosis not present

## 2017-10-19 DIAGNOSIS — N186 End stage renal disease: Secondary | ICD-10-CM | POA: Diagnosis not present

## 2017-10-19 DIAGNOSIS — N2581 Secondary hyperparathyroidism of renal origin: Secondary | ICD-10-CM | POA: Diagnosis not present

## 2017-10-22 DIAGNOSIS — N2581 Secondary hyperparathyroidism of renal origin: Secondary | ICD-10-CM | POA: Diagnosis not present

## 2017-10-22 DIAGNOSIS — R51 Headache: Secondary | ICD-10-CM | POA: Diagnosis not present

## 2017-10-22 DIAGNOSIS — N186 End stage renal disease: Secondary | ICD-10-CM | POA: Diagnosis not present

## 2017-10-26 DIAGNOSIS — N2581 Secondary hyperparathyroidism of renal origin: Secondary | ICD-10-CM | POA: Diagnosis not present

## 2017-10-26 DIAGNOSIS — R51 Headache: Secondary | ICD-10-CM | POA: Diagnosis not present

## 2017-10-26 DIAGNOSIS — N186 End stage renal disease: Secondary | ICD-10-CM | POA: Diagnosis not present

## 2017-10-29 DIAGNOSIS — N186 End stage renal disease: Secondary | ICD-10-CM | POA: Diagnosis not present

## 2017-10-29 DIAGNOSIS — N2581 Secondary hyperparathyroidism of renal origin: Secondary | ICD-10-CM | POA: Diagnosis not present

## 2017-10-29 DIAGNOSIS — R51 Headache: Secondary | ICD-10-CM | POA: Diagnosis not present

## 2017-10-31 DIAGNOSIS — N2581 Secondary hyperparathyroidism of renal origin: Secondary | ICD-10-CM | POA: Diagnosis not present

## 2017-10-31 DIAGNOSIS — N186 End stage renal disease: Secondary | ICD-10-CM | POA: Diagnosis not present

## 2017-10-31 DIAGNOSIS — R51 Headache: Secondary | ICD-10-CM | POA: Diagnosis not present

## 2017-11-02 DIAGNOSIS — N186 End stage renal disease: Secondary | ICD-10-CM | POA: Diagnosis not present

## 2017-11-02 DIAGNOSIS — N2581 Secondary hyperparathyroidism of renal origin: Secondary | ICD-10-CM | POA: Diagnosis not present

## 2017-11-02 DIAGNOSIS — R51 Headache: Secondary | ICD-10-CM | POA: Diagnosis not present

## 2017-11-05 ENCOUNTER — Other Ambulatory Visit: Payer: Self-pay

## 2017-11-05 ENCOUNTER — Ambulatory Visit (HOSPITAL_COMMUNITY): Payer: BLUE CROSS/BLUE SHIELD | Admitting: Anesthesiology

## 2017-11-05 ENCOUNTER — Encounter (HOSPITAL_COMMUNITY): Payer: Self-pay | Admitting: *Deleted

## 2017-11-05 ENCOUNTER — Encounter (HOSPITAL_COMMUNITY)
Admission: RE | Disposition: A | Discharge status: Home / Self Care | Payer: Self-pay | Source: Ambulatory Visit | Attending: Gastroenterology

## 2017-11-05 ENCOUNTER — Ambulatory Visit (HOSPITAL_COMMUNITY)
Admission: RE | Admit: 2017-11-05 | Discharge: 2017-11-05 | Disposition: A | Discharge status: Home / Self Care | Payer: BLUE CROSS/BLUE SHIELD | Source: Ambulatory Visit | Attending: Gastroenterology | Admitting: Gastroenterology

## 2017-11-05 DIAGNOSIS — Z794 Long term (current) use of insulin: Secondary | ICD-10-CM | POA: Insufficient documentation

## 2017-11-05 DIAGNOSIS — Z79899 Other long term (current) drug therapy: Secondary | ICD-10-CM | POA: Diagnosis not present

## 2017-11-05 DIAGNOSIS — E11319 Type 2 diabetes mellitus with unspecified diabetic retinopathy without macular edema: Secondary | ICD-10-CM | POA: Diagnosis not present

## 2017-11-05 DIAGNOSIS — Z7982 Long term (current) use of aspirin: Secondary | ICD-10-CM | POA: Diagnosis not present

## 2017-11-05 DIAGNOSIS — I5022 Chronic systolic (congestive) heart failure: Secondary | ICD-10-CM | POA: Diagnosis not present

## 2017-11-05 DIAGNOSIS — Z1211 Encounter for screening for malignant neoplasm of colon: Secondary | ICD-10-CM | POA: Insufficient documentation

## 2017-11-05 DIAGNOSIS — Z1212 Encounter for screening for malignant neoplasm of rectum: Secondary | ICD-10-CM

## 2017-11-05 DIAGNOSIS — Z992 Dependence on renal dialysis: Secondary | ICD-10-CM | POA: Diagnosis not present

## 2017-11-05 DIAGNOSIS — I132 Hypertensive heart and chronic kidney disease with heart failure and with stage 5 chronic kidney disease, or end stage renal disease: Secondary | ICD-10-CM | POA: Insufficient documentation

## 2017-11-05 DIAGNOSIS — E1140 Type 2 diabetes mellitus with diabetic neuropathy, unspecified: Secondary | ICD-10-CM | POA: Insufficient documentation

## 2017-11-05 DIAGNOSIS — E669 Obesity, unspecified: Secondary | ICD-10-CM | POA: Diagnosis not present

## 2017-11-05 DIAGNOSIS — K219 Gastro-esophageal reflux disease without esophagitis: Secondary | ICD-10-CM | POA: Insufficient documentation

## 2017-11-05 DIAGNOSIS — D124 Benign neoplasm of descending colon: Secondary | ICD-10-CM | POA: Diagnosis not present

## 2017-11-05 DIAGNOSIS — R52 Pain, unspecified: Secondary | ICD-10-CM | POA: Diagnosis not present

## 2017-11-05 DIAGNOSIS — E785 Hyperlipidemia, unspecified: Secondary | ICD-10-CM | POA: Insufficient documentation

## 2017-11-05 DIAGNOSIS — K640 First degree hemorrhoids: Secondary | ICD-10-CM | POA: Diagnosis not present

## 2017-11-05 DIAGNOSIS — N2581 Secondary hyperparathyroidism of renal origin: Secondary | ICD-10-CM | POA: Diagnosis not present

## 2017-11-05 DIAGNOSIS — K635 Polyp of colon: Secondary | ICD-10-CM | POA: Insufficient documentation

## 2017-11-05 DIAGNOSIS — N186 End stage renal disease: Secondary | ICD-10-CM | POA: Diagnosis not present

## 2017-11-05 DIAGNOSIS — I429 Cardiomyopathy, unspecified: Secondary | ICD-10-CM | POA: Diagnosis not present

## 2017-11-05 DIAGNOSIS — Z89421 Acquired absence of other right toe(s): Secondary | ICD-10-CM | POA: Insufficient documentation

## 2017-11-05 DIAGNOSIS — E1122 Type 2 diabetes mellitus with diabetic chronic kidney disease: Secondary | ICD-10-CM | POA: Insufficient documentation

## 2017-11-05 DIAGNOSIS — I272 Pulmonary hypertension, unspecified: Secondary | ICD-10-CM | POA: Insufficient documentation

## 2017-11-05 DIAGNOSIS — I10 Essential (primary) hypertension: Secondary | ICD-10-CM | POA: Diagnosis not present

## 2017-11-05 DIAGNOSIS — K649 Unspecified hemorrhoids: Secondary | ICD-10-CM | POA: Diagnosis not present

## 2017-11-05 DIAGNOSIS — I2510 Atherosclerotic heart disease of native coronary artery without angina pectoris: Secondary | ICD-10-CM | POA: Insufficient documentation

## 2017-11-05 DIAGNOSIS — Z6833 Body mass index (BMI) 33.0-33.9, adult: Secondary | ICD-10-CM | POA: Diagnosis not present

## 2017-11-05 HISTORY — PX: POLYPECTOMY: SHX5525

## 2017-11-05 HISTORY — PX: COLONOSCOPY WITH PROPOFOL: SHX5780

## 2017-11-05 LAB — POCT I-STAT 4, (NA,K, GLUC, HGB,HCT)
GLUCOSE: 127 mg/dL — AB (ref 70–99)
HEMATOCRIT: 37 % — AB (ref 39.0–52.0)
Hemoglobin: 12.6 g/dL — ABNORMAL LOW (ref 13.0–17.0)
Potassium: 4.6 mmol/L (ref 3.5–5.1)
Sodium: 136 mmol/L (ref 135–145)

## 2017-11-05 SURGERY — COLONOSCOPY WITH PROPOFOL
Anesthesia: Monitor Anesthesia Care

## 2017-11-05 MED ORDER — PROPOFOL 500 MG/50ML IV EMUL
INTRAVENOUS | Status: DC | PRN
  Administered 2017-11-05: 30 mg via INTRAVENOUS

## 2017-11-05 MED ORDER — PROPOFOL 500 MG/50ML IV EMUL
INTRAVENOUS | Status: DC | PRN
  Administered 2017-11-05: 125 ug/kg/min via INTRAVENOUS

## 2017-11-05 MED ORDER — SODIUM CHLORIDE 0.9 % IV SOLN
INTRAVENOUS | Status: DC
  Administered 2017-11-05: 10:00:00 via INTRAVENOUS

## 2017-11-05 SURGICAL SUPPLY — 21 items

## 2017-11-05 NOTE — Anesthesia Preprocedure Evaluation (Signed)
Anesthesia Evaluation  Patient identified by MRN, date of birth, ID band Patient awake    Reviewed: Allergy & Precautions, NPO status , Patient's Chart, lab work & pertinent test results  Airway Mallampati: II  TM Distance: >3 FB Neck ROM: Full    Dental no notable dental hx.    Pulmonary neg pulmonary ROS,    Pulmonary exam normal breath sounds clear to auscultation       Cardiovascular hypertension, +CHF  Normal cardiovascular exam+ Cardiac Defibrillator  Rhythm:Regular Rate:Normal     Neuro/Psych negative neurological ROS  negative psych ROS   GI/Hepatic negative GI ROS, Neg liver ROS,   Endo/Other  diabetes  Renal/GU Renal disease  negative genitourinary   Musculoskeletal negative musculoskeletal ROS (+)   Abdominal   Peds negative pediatric ROS (+)  Hematology negative hematology ROS (+)   Anesthesia Other Findings   Reproductive/Obstetrics negative OB ROS                             Anesthesia Physical Anesthesia Plan  ASA: IV  Anesthesia Plan: MAC   Post-op Pain Management:    Induction: Intravenous  PONV Risk Score and Plan: 0  Airway Management Planned: Simple Face Mask  Additional Equipment:   Intra-op Plan:   Post-operative Plan:   Informed Consent: I have reviewed the patients History and Physical, chart, labs and discussed the procedure including the risks, benefits and alternatives for the proposed anesthesia with the patient or authorized representative who has indicated his/her understanding and acceptance.   Dental advisory given  Plan Discussed with: CRNA and Surgeon  Anesthesia Plan Comments:         Anesthesia Quick Evaluation

## 2017-11-05 NOTE — Transfer of Care (Signed)
Immediate Anesthesia Transfer of Care Note  Patient: Austin Kramer  Procedure(s) Performed: COLONOSCOPY WITH PROPOFOL (N/A ) POLYPECTOMY  Patient Location: PACU  Anesthesia Type:MAC  Level of Consciousness: sedated, patient cooperative and responds to stimulation  Airway & Oxygen Therapy: Patient Spontanous Breathing and Patient connected to face mask oxygen  Post-op Assessment: Report given to RN and Post -op Vital signs reviewed and stable  Post vital signs: Reviewed and stable  Last Vitals:  Vitals Value Taken Time  BP    Temp    Pulse    Resp    SpO2      Last Pain:  Vitals:   11/05/17 1000  TempSrc: Oral  PainSc: 0-No pain         Complications: No apparent anesthesia complications

## 2017-11-05 NOTE — Discharge Instructions (Signed)

## 2017-11-05 NOTE — Anesthesia Postprocedure Evaluation (Signed)
Anesthesia Post Note  Patient: Austin Kramer  Procedure(s) Performed: COLONOSCOPY WITH PROPOFOL (N/A ) POLYPECTOMY     Patient location during evaluation: PACU Anesthesia Type: MAC Level of consciousness: awake and alert Pain management: pain level controlled Vital Signs Assessment: post-procedure vital signs reviewed and stable Respiratory status: spontaneous breathing, nonlabored ventilation, respiratory function stable and patient connected to nasal cannula oxygen Cardiovascular status: stable and blood pressure returned to baseline Postop Assessment: no apparent nausea or vomiting Anesthetic complications: no    Last Vitals:  Vitals:   11/05/17 1110 11/05/17 1120  BP: (!) 118/53 127/67  Pulse: 72 78  Resp: 20 17  Temp:    SpO2: 100% 97%    Last Pain:  Vitals:   11/05/17 1120  TempSrc:   PainSc: 0-No pain                 Fabiha Rougeau S

## 2017-11-05 NOTE — Op Note (Signed)
[facility] Patient Name: Austin Kramer Procedure Date: 11/05/2017 MRN: [id number] Attending MD: Ladene Artist , MD Date of Birth: 11/27/1960 CSN: [id number] Age: 57 Admit Type: Outpatient Procedure:                Colonoscopy Indications:              Screening for colorectal malignant neoplasm Providers:                Pricilla Riffle. Fuller Plan, MD, Angus Seller, Janie                            Billups, Technician, Herbie Drape, CRNA Referring MD:             Denita Lung, MD Medicines:                Monitored Anesthesia Care Complications:            No immediate complications. Estimated blood loss:                            None. Estimated Blood Loss:     Estimated blood loss: none. Procedure:                Pre-Anesthesia Assessment:                           - Prior to the procedure, a History and Physical                            was performed, and patient medications and                            allergies were reviewed. The patient's tolerance of                            previous anesthesia was also reviewed. The risks                            and benefits of the procedure and the sedation                            options and risks were discussed with the patient.                            All questions were answered, and informed consent                            was obtained. Prior Anticoagulants: The patient has                            taken no previous anticoagulant or antiplatelet                            agents. ASA Grade Assessment: III - A patient with  severe systemic disease. After reviewing the risks                            and benefits, the patient was deemed in                            satisfactory condition to undergo the procedure.                           After obtaining informed consent, the colonoscope                            was passed under direct vision. Throughout the              procedure, the patient's blood pressure, pulse, and                            oxygen saturations were monitored continuously. The                            CF-HQ190L ([id number]) Olympus adult colonoscope was                            introduced through the anus and advanced to the the                            cecum, identified by appendiceal orifice and                            ileocecal valve. The ileocecal valve, appendiceal                            orifice, and rectum were photographed. The quality                            of the bowel preparation was good. The colonoscopy                            was performed without difficulty. The patient                            tolerated the procedure well. Scope In: 10:47:54 AM Scope Out: 11:03:52 AM Total Procedure Duration: 0 hours 15 minutes 58 seconds  Findings:      The perianal and digital rectal examinations were normal.      Two sessile polyps were found in the descending colon. The polyps were 6       mm in size. These polyps were removed with a cold snare. Resection and       retrieval were complete.      Internal hemorrhoids were found during retroflexion. The hemorrhoids       were small and Grade I (internal hemorrhoids that do not prolapse).      The exam was otherwise without abnormality on direct and retroflexion       views. Impression:               -  Two 6 mm polyps in the descending colon, removed                            with a cold snare. Resected and retrieved.                           - Internal hemorrhoids.                           - The examination was otherwise normal on direct                            and retroflexion views. Moderate Sedation:      N/A- Per Anesthesia Care Recommendation:           - Repeat colonoscopy in 5 years for surveillance if                            polyp(s) are precancerous, otherwise 10 years.                           - Patient has a contact number  available for                            emergencies. The signs and symptoms of potential                            delayed complications were discussed with the                            patient. Return to normal activities tomorrow.                            Written discharge instructions were provided to the                            patient.                           - Resume previous diet.                           - Continue present medications.                           - Await pathology results. Procedure Code(s):        --- Professional ---                           [phone], Colonoscopy, flexible; with removal of                            tumor(s), polyp(s), or other lesion(s) by snare                            technique Diagnosis Code(s):        ---  Professional ---                           Z12.11, Encounter for screening for malignant                            neoplasm of colon                           D12.4, Benign neoplasm of descending colon                           K64.0, First degree hemorrhoids CPT copyright 2017 [employer]. All rights reserved. The codes documented in this report are preliminary and upon coder review may  be revised to meet current compliance requirements. Ladene Artist, MD 11/05/2017 11:07:10 AM This report has been signed electronically. Number of Addenda: 0

## 2017-11-05 NOTE — H&P (Signed)
History of Present Illness: This is a 57 year old male referred by Austin Lung, MD for consideration of a screening colonoscopy prior to renal transplant.  He does not recall having a colonoscopy.  He has no gastrointestinal complaints.  He has chronic systolic heart failure with an ejection fraction of 30 - 35%.  Denies weight loss, abdominal pain, constipation, diarrhea, change in stool caliber, melena, hematochezia, nausea, vomiting, dysphagia, reflux symptoms, chest pain.   No Known Allergies       Outpatient Medications Prior to Visit  Medication Sig Dispense Refill  . acetaminophen (TYLENOL) 500 MG tablet Take 1,000 mg by mouth daily as needed for moderate pain or headache.     Marland Kitchen amLODipine (NORVASC) 5 MG tablet Take 5 mg by mouth daily.    Marland Kitchen aspirin 81 MG tablet Take 81 mg by mouth daily.    . calcitRIOL (ROCALTROL) 0.25 MCG capsule Take 0.25 mcg by mouth daily.    . calcium acetate (PHOSLO) 667 MG capsule Take 2 capsules (1,334 mg total) by mouth 3 (three) times daily with meals. (Patient taking differently: Take 2,001 mg by mouth 3 (three) times daily with meals. ) 90 capsule 0  . carvedilol (COREG) 6.25 MG tablet Take 1 tablet (6.25 mg total) by mouth 2 (two) times daily. 180 tablet 3  . Cholecalciferol (VITAMIN D PO) Take 1 tablet by mouth daily.    . cloNIDine (CATAPRES) 0.2 MG tablet Take 0.2 mg by mouth at bedtime.    . colchicine 0.6 MG tablet Take 1 tablet (0.6 mg total) by mouth daily. 90 tablet 1  . fish oil-omega-3 fatty acids 1000 MG capsule Take 1 g by mouth daily.    . furosemide (LASIX) 40 MG tablet Take 1 tablet (40 mg total) by mouth daily. 90 tablet 3  . hydrALAZINE (APRESOLINE) 25 MG tablet TAKE 1 TABLET BY MOUTH THREE TIMES DAILY 90 tablet 11  . insulin detemir (LEVEMIR) 100 UNIT/ML injection Inject 0.1 mLs (10 Units total) into the skin 2 (two) times daily. 20 mL 0  . insulin lispro (HUMALOG) 100 UNIT/ML injection Inject 7 Units into the skin 3  (three) times daily before meals.    . isosorbide mononitrate (IMDUR) 30 MG 24 hr tablet Take 0.5 tablets (15 mg total) daily by mouth. 90 tablet 3  . ivabradine (CORLANOR) 5 MG TABS tablet Take 1 tablet (5 mg total) by mouth 2 (two) times daily with a meal. 180 tablet 2  . Magnesium 250 MG TABS Take 250 mg by mouth daily.     . Misc Natural Products (TART CHERRY ADVANCED PO) Take 1 tablet by mouth daily.     . Multiple Minerals (CALCIUM/MAGNESIUM/ZINC PO) Take 1 tablet by mouth daily.     . Multiple Vitamin (MULTIVITAMIN WITH MINERALS) TABS Take 1 tablet by mouth daily.    . rosuvastatin (CRESTOR) 20 MG tablet TAKE 1 TABLET BY MOUTH ONCE DAILY 30 tablet 0  . traMADol (ULTRAM) 50 MG tablet Take 1 tablet by mouth as needed for pain.    . vitamin B-12 (CYANOCOBALAMIN) 1000 MCG tablet Take 1,000 mcg by mouth daily.    Marland Kitchen VITAMIN E PO Take 1 capsule by mouth daily.    . Multiple Vitamins-Minerals (AIRBORNE) CHEW Chew 1 tablet by mouth daily.     No facility-administered medications prior to visit.        Past Medical History:  Diagnosis Date  . Anemia   . CAD (coronary artery disease) 09/12/2016   R/L heart  cath 09/01/16:  nl LAD, pLCx 25, pRCA 25, EF 25-35; Mean RA 8, PASP 60, mean PA 43, LVEDP 29  . Chronic systolic CHF (congestive heart failure) ([place])    Echo 12/17: EF 40-45 // L HC 7/18: EF 25-35, LVEDP 29  . Chronic systolic heart failure ([place]) 06/21/2017  . Coronary artery disease involving native coronary artery of native heart without angina pectoris 09/12/2016   R/L heart cath 09/01/16:  nl LAD, pLCx 25, pRCA 25, EF 25-35; Mean RA 8, PASP 60, mean PA 43, LVEDP 29  . Diabetes mellitus   . Diabetic retinopathy associated with type 2 diabetes mellitus ([place]) 12/17/2012  . Dyspnea 07/23/2016  . ED (erectile dysfunction)   . ESRD on dialysis ([place])   . ESRD on hemodialysis ([place])    a. started HD 07/2016  . GERD (gastroesophageal reflux disease)    occasional  .  Hyperlipidemia   . Hyperlipidemia LDL goal <70 03/27/2011  . Hypertension    [name]  . Hypertension associated with diabetes ([place]) 03/27/2011  . Hypertensive heart disease with chronic systolic congestive heart failure ([place]) 09/12/2016  . NICM (nonischemic cardiomyopathy) ([place])    a. Echo 12/17-mod LVH, EF 40-45, diff HK, diast+syst flattening, RVH, mild LAE/RAE, mild MR, PASP 38mmHg //  Nuclear stress test 01/2016: EF 42, no ischemia. // R/L Ht Cath 7/18: min cor plaque; elev LVEDP, elev PASP >> Echo 08/2017 with improvement in PASP (75mmHg>>52mmHg) and no change in EF at 30-35%  . Obesity   . Peripheral neuropathy    neuropathy, "tingling in feet"  . 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 PPD positive, treated   . Prostate cancer ([place])   . Pulmonary hypertension ([place])    severe PAH by 01/2016 echo  . Pulmonary hypertension ([place])    a. PASP 94 by echo 01/2016. b. After initiation of dialysis, cath 08/2016 showed moderate pulm HTN.  Marland Kitchen Renal failure 06/10/2013  . Renal insufficiency 01/11/2012  . Type 2 diabetes mellitus with stage 5 chronic kidney disease not on chronic dialysis, with long-term current use of insulin ([place]) 03/27/2011  . Ureteritis 06/10/2013        Past Surgical History:  Procedure Laterality Date  . AMPUTATION  01/26/2012   Procedure: AMPUTATION RAY;  Surgeon: Newt Minion, MD;  Location: [place];  Service: Orthopedics;  Laterality: Right;  Right foot 2nd ray amputation  . AV FISTULA PLACEMENT Left 10/09/2014   Procedure: CREATION OF LEFT RADIOCEPHALIC ARTERIOVENOUS (AV) FISTULA ;  Surgeon: Serafina Mitchell, MD;  Location: [place];  Service: Vascular;  Laterality: Left;  . COLONOSCOPY    . FRACTURE SURGERY     shoulder surgery  . KNEE CARTILAGE SURGERY    . PARS PLANA VITRECTOMY Left 12/17/2012   Procedure: LEFT PARS PLANA VITRECTOMY WITH 25 GAUGE/ENDO LASER;  Surgeon: Hurman Horn, MD;  Location: [place];  Service: Ophthalmology;  Laterality: Left;   . PARS PLANA VITRECTOMY Right 02/05/2016   Procedure: PARS PLANA VITRECTOMY WITH 25 GAUGE;  Surgeon: Jalene Mullet, MD;  Location: [place];  Service: Ophthalmology;  Laterality: Right;  with block  . PHOTOCOAGULATION WITH LASER Left 12/17/2012   Procedure: PHOTOCOAGULATION WITH LASER;  Surgeon: Hurman Horn, MD;  Location: [place];  Service: Ophthalmology;  Laterality: Left;  . PROSTATECTOMY  2011  . RIGHT/LEFT HEART CATH AND CORONARY ANGIOGRAPHY N/A 09/01/2016   Procedure: Right/Left Heart Cath and Coronary Angiography;  Surgeon: Martinique, Peter M, MD;  Location: [facility];  Service: Cardiovascular;  Laterality: N/A;  . SUBQ ICD IMPLANT N/A 06/21/2017   Procedure: SUBQ ICD IMPLANT;  Surgeon: Evans Lance, MD;  Location: [place] CV [facility];  Service: Cardiovascular;  Laterality: N/A;  . VASECTOMY     Social History        Socioeconomic History  . Marital status: Married    Spouse name: Not on file  . Number of children: 4  . Years of education: Not on file  . Highest education level: Not on file  Occupational History  . Occupation: Office manager  . Financial resource strain: Not on file  . Food insecurity:    Worry: Not on file    Inability: Not on file  . Transportation needs:    Medical: Not on file    Non-medical: Not on file  Tobacco Use  . Smoking status: Never Smoker  . Smokeless tobacco: Never Used  Substance and Sexual Activity  . Alcohol use: No  . Drug use: No  . Sexual activity: Yes  Lifestyle  . Physical activity:    Days per week: Not on file    Minutes per session: Not on file  . Stress: Not on file  Relationships  . Social connections:    Talks on phone: Not on file    Gets together: Not on file    Attends religious service: Not on file    Active member of club or organization: Not on file    Attends meetings of clubs or organizations: Not on file    Relationship status: Not on file  Other Topics  Concern  . Not on file  Social History Narrative  . Not on file        Family History  Problem Relation Age of Onset  . Diabetes Mother   . Diabetes Father   . Colon cancer Neg Hx   . Rectal cancer Neg Hx   . Esophageal cancer Neg Hx        Review of Systems: Pertinent positive and negative review of systems were noted in the above HPI section. All other review of systems were otherwise negative.   Physical Exam: General: Well developed, well nourished, no acute distress Head: Normocephalic and atraumatic Eyes:  sclerae anicteric, EOMI Ears: Normal auditory acuity Mouth: No deformity or lesions Neck: Supple, no masses or thyromegaly Lungs: Clear throughout to auscultation Heart: Regular rate and rhythm; no murmurs, rubs or bruits Abdomen: Soft, non tender and non distended. No masses, hepatosplenomegaly or hernias noted. Normal Bowel sounds Rectal: Deferred to colonoscopy Musculoskeletal: Symmetrical with no gross deformities  Skin: No lesions on visible extremities Pulses:  Normal pulses noted Extremities: No clubbing, cyanosis, edema or deformities noted Neurological: Alert oriented x 4, grossly nonfocal Cervical Nodes:  No significant cervical adenopathy Inguinal Nodes: No significant inguinal adenopathy Psychological:  Alert and cooperative. Normal mood and affect   Assessment and Recommendations:  1.  CRC screening, average risk.  Schedule colonoscopy at hospital. The risks (including bleeding, perforation, infection, missed lesions, medication reactions and possible hospitalization or surgery if complications occur), benefits, and alternatives to colonoscopy with possible biopsy and possible polypectomy were discussed with the patient and they consent to proceed.   2. Chronic systolic heart failure with EF 30 - 35 %.  3. ESRD on HD.   4. DM.

## 2017-11-06 ENCOUNTER — Encounter (HOSPITAL_COMMUNITY): Payer: Self-pay | Admitting: Gastroenterology

## 2017-11-07 DIAGNOSIS — R52 Pain, unspecified: Secondary | ICD-10-CM | POA: Diagnosis not present

## 2017-11-07 DIAGNOSIS — N186 End stage renal disease: Secondary | ICD-10-CM | POA: Diagnosis not present

## 2017-11-07 DIAGNOSIS — N2581 Secondary hyperparathyroidism of renal origin: Secondary | ICD-10-CM | POA: Diagnosis not present

## 2017-11-08 DIAGNOSIS — N186 End stage renal disease: Secondary | ICD-10-CM | POA: Diagnosis not present

## 2017-11-08 DIAGNOSIS — T82858A Stenosis of vascular prosthetic devices, implants and grafts, initial encounter: Secondary | ICD-10-CM | POA: Diagnosis not present

## 2017-11-08 DIAGNOSIS — Z992 Dependence on renal dialysis: Secondary | ICD-10-CM | POA: Diagnosis not present

## 2017-11-08 DIAGNOSIS — I871 Compression of vein: Secondary | ICD-10-CM | POA: Diagnosis not present

## 2017-11-12 ENCOUNTER — Encounter: Payer: Self-pay | Admitting: Gastroenterology

## 2017-11-12 DIAGNOSIS — N2581 Secondary hyperparathyroidism of renal origin: Secondary | ICD-10-CM | POA: Diagnosis not present

## 2017-11-12 DIAGNOSIS — Z23 Encounter for immunization: Secondary | ICD-10-CM | POA: Diagnosis not present

## 2017-11-12 DIAGNOSIS — E1129 Type 2 diabetes mellitus with other diabetic kidney complication: Secondary | ICD-10-CM | POA: Diagnosis not present

## 2017-11-12 DIAGNOSIS — Z992 Dependence on renal dialysis: Secondary | ICD-10-CM | POA: Diagnosis not present

## 2017-11-12 DIAGNOSIS — N186 End stage renal disease: Secondary | ICD-10-CM | POA: Diagnosis not present

## 2017-11-14 DIAGNOSIS — N2581 Secondary hyperparathyroidism of renal origin: Secondary | ICD-10-CM | POA: Diagnosis not present

## 2017-11-14 DIAGNOSIS — A499 Bacterial infection, unspecified: Secondary | ICD-10-CM | POA: Diagnosis not present

## 2017-11-14 DIAGNOSIS — N186 End stage renal disease: Secondary | ICD-10-CM | POA: Diagnosis not present

## 2017-11-19 DIAGNOSIS — A499 Bacterial infection, unspecified: Secondary | ICD-10-CM | POA: Diagnosis not present

## 2017-11-19 DIAGNOSIS — R509 Fever, unspecified: Secondary | ICD-10-CM | POA: Diagnosis not present

## 2017-11-19 DIAGNOSIS — N2581 Secondary hyperparathyroidism of renal origin: Secondary | ICD-10-CM | POA: Diagnosis not present

## 2017-11-19 DIAGNOSIS — N186 End stage renal disease: Secondary | ICD-10-CM | POA: Diagnosis not present

## 2017-11-21 DIAGNOSIS — A499 Bacterial infection, unspecified: Secondary | ICD-10-CM | POA: Diagnosis not present

## 2017-11-21 DIAGNOSIS — N186 End stage renal disease: Secondary | ICD-10-CM | POA: Diagnosis not present

## 2017-11-21 DIAGNOSIS — R509 Fever, unspecified: Secondary | ICD-10-CM | POA: Diagnosis not present

## 2017-11-21 DIAGNOSIS — N2581 Secondary hyperparathyroidism of renal origin: Secondary | ICD-10-CM | POA: Diagnosis not present

## 2017-11-23 DIAGNOSIS — N2581 Secondary hyperparathyroidism of renal origin: Secondary | ICD-10-CM | POA: Diagnosis not present

## 2017-11-23 DIAGNOSIS — R509 Fever, unspecified: Secondary | ICD-10-CM | POA: Diagnosis not present

## 2017-11-23 DIAGNOSIS — N186 End stage renal disease: Secondary | ICD-10-CM | POA: Diagnosis not present

## 2017-11-23 DIAGNOSIS — A499 Bacterial infection, unspecified: Secondary | ICD-10-CM | POA: Diagnosis not present

## 2017-11-26 DIAGNOSIS — N2581 Secondary hyperparathyroidism of renal origin: Secondary | ICD-10-CM | POA: Diagnosis not present

## 2017-11-26 DIAGNOSIS — N186 End stage renal disease: Secondary | ICD-10-CM | POA: Diagnosis not present

## 2017-11-26 DIAGNOSIS — A499 Bacterial infection, unspecified: Secondary | ICD-10-CM | POA: Diagnosis not present

## 2017-11-28 DIAGNOSIS — A499 Bacterial infection, unspecified: Secondary | ICD-10-CM | POA: Diagnosis not present

## 2017-11-28 DIAGNOSIS — N186 End stage renal disease: Secondary | ICD-10-CM | POA: Diagnosis not present

## 2017-11-28 DIAGNOSIS — N2581 Secondary hyperparathyroidism of renal origin: Secondary | ICD-10-CM | POA: Diagnosis not present

## 2017-11-29 ENCOUNTER — Ambulatory Visit: Payer: BLUE CROSS/BLUE SHIELD | Admitting: Cardiology

## 2017-11-30 DIAGNOSIS — A499 Bacterial infection, unspecified: Secondary | ICD-10-CM | POA: Diagnosis not present

## 2017-11-30 DIAGNOSIS — N2581 Secondary hyperparathyroidism of renal origin: Secondary | ICD-10-CM | POA: Diagnosis not present

## 2017-11-30 DIAGNOSIS — N186 End stage renal disease: Secondary | ICD-10-CM | POA: Diagnosis not present

## 2017-12-02 ENCOUNTER — Other Ambulatory Visit: Payer: Self-pay | Admitting: Family Medicine

## 2017-12-02 DIAGNOSIS — E785 Hyperlipidemia, unspecified: Secondary | ICD-10-CM

## 2017-12-03 DIAGNOSIS — N186 End stage renal disease: Secondary | ICD-10-CM | POA: Diagnosis not present

## 2017-12-03 DIAGNOSIS — N2581 Secondary hyperparathyroidism of renal origin: Secondary | ICD-10-CM | POA: Diagnosis not present

## 2017-12-05 DIAGNOSIS — N186 End stage renal disease: Secondary | ICD-10-CM | POA: Diagnosis not present

## 2017-12-05 DIAGNOSIS — N2581 Secondary hyperparathyroidism of renal origin: Secondary | ICD-10-CM | POA: Diagnosis not present

## 2017-12-10 DIAGNOSIS — N186 End stage renal disease: Secondary | ICD-10-CM | POA: Diagnosis not present

## 2017-12-10 DIAGNOSIS — N2581 Secondary hyperparathyroidism of renal origin: Secondary | ICD-10-CM | POA: Diagnosis not present

## 2017-12-12 DIAGNOSIS — N2581 Secondary hyperparathyroidism of renal origin: Secondary | ICD-10-CM | POA: Diagnosis not present

## 2017-12-12 DIAGNOSIS — N186 End stage renal disease: Secondary | ICD-10-CM | POA: Diagnosis not present

## 2017-12-13 DIAGNOSIS — E1129 Type 2 diabetes mellitus with other diabetic kidney complication: Secondary | ICD-10-CM | POA: Diagnosis not present

## 2017-12-13 DIAGNOSIS — Z992 Dependence on renal dialysis: Secondary | ICD-10-CM | POA: Diagnosis not present

## 2017-12-13 DIAGNOSIS — N186 End stage renal disease: Secondary | ICD-10-CM | POA: Diagnosis not present

## 2017-12-14 DIAGNOSIS — N2581 Secondary hyperparathyroidism of renal origin: Secondary | ICD-10-CM | POA: Diagnosis not present

## 2017-12-14 DIAGNOSIS — N186 End stage renal disease: Secondary | ICD-10-CM | POA: Diagnosis not present

## 2017-12-17 DIAGNOSIS — N186 End stage renal disease: Secondary | ICD-10-CM | POA: Diagnosis not present

## 2017-12-17 DIAGNOSIS — N2581 Secondary hyperparathyroidism of renal origin: Secondary | ICD-10-CM | POA: Diagnosis not present

## 2017-12-19 DIAGNOSIS — N186 End stage renal disease: Secondary | ICD-10-CM | POA: Diagnosis not present

## 2017-12-19 DIAGNOSIS — N2581 Secondary hyperparathyroidism of renal origin: Secondary | ICD-10-CM | POA: Diagnosis not present

## 2017-12-21 DIAGNOSIS — N2581 Secondary hyperparathyroidism of renal origin: Secondary | ICD-10-CM | POA: Diagnosis not present

## 2017-12-21 DIAGNOSIS — N186 End stage renal disease: Secondary | ICD-10-CM | POA: Diagnosis not present

## 2017-12-24 DIAGNOSIS — N2581 Secondary hyperparathyroidism of renal origin: Secondary | ICD-10-CM | POA: Diagnosis not present

## 2017-12-24 DIAGNOSIS — N186 End stage renal disease: Secondary | ICD-10-CM | POA: Diagnosis not present

## 2017-12-26 ENCOUNTER — Ambulatory Visit (INDEPENDENT_AMBULATORY_CARE_PROVIDER_SITE_OTHER): Payer: BLUE CROSS/BLUE SHIELD | Admitting: *Deleted

## 2017-12-26 DIAGNOSIS — I5022 Chronic systolic (congestive) heart failure: Secondary | ICD-10-CM

## 2017-12-26 DIAGNOSIS — N186 End stage renal disease: Secondary | ICD-10-CM | POA: Diagnosis not present

## 2017-12-26 DIAGNOSIS — N2581 Secondary hyperparathyroidism of renal origin: Secondary | ICD-10-CM | POA: Diagnosis not present

## 2017-12-26 DIAGNOSIS — I428 Other cardiomyopathies: Secondary | ICD-10-CM

## 2017-12-26 NOTE — Progress Notes (Signed)
Remote ICD transmission.   

## 2017-12-27 DIAGNOSIS — E113512 Type 2 diabetes mellitus with proliferative diabetic retinopathy with macular edema, left eye: Secondary | ICD-10-CM | POA: Diagnosis not present

## 2017-12-28 DIAGNOSIS — N2581 Secondary hyperparathyroidism of renal origin: Secondary | ICD-10-CM | POA: Diagnosis not present

## 2017-12-28 DIAGNOSIS — N186 End stage renal disease: Secondary | ICD-10-CM | POA: Diagnosis not present

## 2017-12-31 DIAGNOSIS — N2581 Secondary hyperparathyroidism of renal origin: Secondary | ICD-10-CM | POA: Diagnosis not present

## 2017-12-31 DIAGNOSIS — N186 End stage renal disease: Secondary | ICD-10-CM | POA: Diagnosis not present

## 2018-01-02 DIAGNOSIS — N186 End stage renal disease: Secondary | ICD-10-CM | POA: Diagnosis not present

## 2018-01-02 DIAGNOSIS — N2581 Secondary hyperparathyroidism of renal origin: Secondary | ICD-10-CM | POA: Diagnosis not present

## 2018-01-04 DIAGNOSIS — N2581 Secondary hyperparathyroidism of renal origin: Secondary | ICD-10-CM | POA: Diagnosis not present

## 2018-01-04 DIAGNOSIS — N186 End stage renal disease: Secondary | ICD-10-CM | POA: Diagnosis not present

## 2018-01-06 DIAGNOSIS — N186 End stage renal disease: Secondary | ICD-10-CM | POA: Diagnosis not present

## 2018-01-06 DIAGNOSIS — R51 Headache: Secondary | ICD-10-CM | POA: Diagnosis not present

## 2018-01-06 DIAGNOSIS — N2581 Secondary hyperparathyroidism of renal origin: Secondary | ICD-10-CM | POA: Diagnosis not present

## 2018-01-08 DIAGNOSIS — R51 Headache: Secondary | ICD-10-CM | POA: Diagnosis not present

## 2018-01-08 DIAGNOSIS — N2581 Secondary hyperparathyroidism of renal origin: Secondary | ICD-10-CM | POA: Diagnosis not present

## 2018-01-08 DIAGNOSIS — N186 End stage renal disease: Secondary | ICD-10-CM | POA: Diagnosis not present

## 2018-01-11 DIAGNOSIS — N186 End stage renal disease: Secondary | ICD-10-CM | POA: Diagnosis not present

## 2018-01-11 DIAGNOSIS — N2581 Secondary hyperparathyroidism of renal origin: Secondary | ICD-10-CM | POA: Diagnosis not present

## 2018-01-11 DIAGNOSIS — R51 Headache: Secondary | ICD-10-CM | POA: Diagnosis not present

## 2018-01-12 DIAGNOSIS — N186 End stage renal disease: Secondary | ICD-10-CM | POA: Diagnosis not present

## 2018-01-12 DIAGNOSIS — Z992 Dependence on renal dialysis: Secondary | ICD-10-CM | POA: Diagnosis not present

## 2018-01-12 DIAGNOSIS — E1129 Type 2 diabetes mellitus with other diabetic kidney complication: Secondary | ICD-10-CM | POA: Diagnosis not present

## 2018-01-14 DIAGNOSIS — N186 End stage renal disease: Secondary | ICD-10-CM | POA: Diagnosis not present

## 2018-01-14 DIAGNOSIS — N2581 Secondary hyperparathyroidism of renal origin: Secondary | ICD-10-CM | POA: Diagnosis not present

## 2018-01-16 DIAGNOSIS — N2581 Secondary hyperparathyroidism of renal origin: Secondary | ICD-10-CM | POA: Diagnosis not present

## 2018-01-16 DIAGNOSIS — N186 End stage renal disease: Secondary | ICD-10-CM | POA: Diagnosis not present

## 2018-01-18 DIAGNOSIS — N186 End stage renal disease: Secondary | ICD-10-CM | POA: Diagnosis not present

## 2018-01-18 DIAGNOSIS — N2581 Secondary hyperparathyroidism of renal origin: Secondary | ICD-10-CM | POA: Diagnosis not present

## 2018-01-21 DIAGNOSIS — N186 End stage renal disease: Secondary | ICD-10-CM | POA: Diagnosis not present

## 2018-01-21 DIAGNOSIS — N2581 Secondary hyperparathyroidism of renal origin: Secondary | ICD-10-CM | POA: Diagnosis not present

## 2018-01-23 DIAGNOSIS — N2581 Secondary hyperparathyroidism of renal origin: Secondary | ICD-10-CM | POA: Diagnosis not present

## 2018-01-23 DIAGNOSIS — N186 End stage renal disease: Secondary | ICD-10-CM | POA: Diagnosis not present

## 2018-01-25 DIAGNOSIS — N186 End stage renal disease: Secondary | ICD-10-CM | POA: Diagnosis not present

## 2018-01-25 DIAGNOSIS — N2581 Secondary hyperparathyroidism of renal origin: Secondary | ICD-10-CM | POA: Diagnosis not present

## 2018-01-28 DIAGNOSIS — N2581 Secondary hyperparathyroidism of renal origin: Secondary | ICD-10-CM | POA: Diagnosis not present

## 2018-01-28 DIAGNOSIS — N186 End stage renal disease: Secondary | ICD-10-CM | POA: Diagnosis not present

## 2018-01-30 DIAGNOSIS — N2581 Secondary hyperparathyroidism of renal origin: Secondary | ICD-10-CM | POA: Diagnosis not present

## 2018-01-30 DIAGNOSIS — N186 End stage renal disease: Secondary | ICD-10-CM | POA: Diagnosis not present

## 2018-02-01 DIAGNOSIS — N2581 Secondary hyperparathyroidism of renal origin: Secondary | ICD-10-CM | POA: Diagnosis not present

## 2018-02-01 DIAGNOSIS — N186 End stage renal disease: Secondary | ICD-10-CM | POA: Diagnosis not present

## 2018-02-03 DIAGNOSIS — N2581 Secondary hyperparathyroidism of renal origin: Secondary | ICD-10-CM | POA: Diagnosis not present

## 2018-02-03 DIAGNOSIS — N186 End stage renal disease: Secondary | ICD-10-CM | POA: Diagnosis not present

## 2018-02-05 DIAGNOSIS — N186 End stage renal disease: Secondary | ICD-10-CM | POA: Diagnosis not present

## 2018-02-05 DIAGNOSIS — N2581 Secondary hyperparathyroidism of renal origin: Secondary | ICD-10-CM | POA: Diagnosis not present

## 2018-02-07 ENCOUNTER — Other Ambulatory Visit: Payer: Self-pay | Admitting: Physician Assistant

## 2018-02-10 DIAGNOSIS — N2581 Secondary hyperparathyroidism of renal origin: Secondary | ICD-10-CM | POA: Diagnosis not present

## 2018-02-10 DIAGNOSIS — N186 End stage renal disease: Secondary | ICD-10-CM | POA: Diagnosis not present

## 2018-02-12 DIAGNOSIS — N186 End stage renal disease: Secondary | ICD-10-CM | POA: Diagnosis not present

## 2018-02-12 DIAGNOSIS — Z992 Dependence on renal dialysis: Secondary | ICD-10-CM | POA: Diagnosis not present

## 2018-02-12 DIAGNOSIS — N2581 Secondary hyperparathyroidism of renal origin: Secondary | ICD-10-CM | POA: Diagnosis not present

## 2018-02-12 DIAGNOSIS — E1129 Type 2 diabetes mellitus with other diabetic kidney complication: Secondary | ICD-10-CM | POA: Diagnosis not present

## 2018-02-15 DIAGNOSIS — N2581 Secondary hyperparathyroidism of renal origin: Secondary | ICD-10-CM | POA: Diagnosis not present

## 2018-02-15 DIAGNOSIS — N186 End stage renal disease: Secondary | ICD-10-CM | POA: Diagnosis not present

## 2018-02-18 DIAGNOSIS — N186 End stage renal disease: Secondary | ICD-10-CM | POA: Diagnosis not present

## 2018-02-18 DIAGNOSIS — N2581 Secondary hyperparathyroidism of renal origin: Secondary | ICD-10-CM | POA: Diagnosis not present

## 2018-02-20 DIAGNOSIS — N2581 Secondary hyperparathyroidism of renal origin: Secondary | ICD-10-CM | POA: Diagnosis not present

## 2018-02-20 DIAGNOSIS — N186 End stage renal disease: Secondary | ICD-10-CM | POA: Diagnosis not present

## 2018-02-22 DIAGNOSIS — N2581 Secondary hyperparathyroidism of renal origin: Secondary | ICD-10-CM | POA: Diagnosis not present

## 2018-02-22 DIAGNOSIS — N186 End stage renal disease: Secondary | ICD-10-CM | POA: Diagnosis not present

## 2018-02-24 LAB — CUP PACEART REMOTE DEVICE CHECK
Implantable Lead Implant Date: 20190509
Implantable Lead Location: 753862
Implantable Lead Model: 3401
Implantable Lead Serial Number: 123518
MDC IDC PG IMPLANT DT: 20190509
MDC IDC PG SERIAL: 243235
MDC IDC SESS DTM: 20200112154212

## 2018-02-25 DIAGNOSIS — N186 End stage renal disease: Secondary | ICD-10-CM | POA: Diagnosis not present

## 2018-02-25 DIAGNOSIS — N2581 Secondary hyperparathyroidism of renal origin: Secondary | ICD-10-CM | POA: Diagnosis not present

## 2018-02-27 DIAGNOSIS — N2581 Secondary hyperparathyroidism of renal origin: Secondary | ICD-10-CM | POA: Diagnosis not present

## 2018-02-27 DIAGNOSIS — N186 End stage renal disease: Secondary | ICD-10-CM | POA: Diagnosis not present

## 2018-03-01 DIAGNOSIS — N2581 Secondary hyperparathyroidism of renal origin: Secondary | ICD-10-CM | POA: Diagnosis not present

## 2018-03-01 DIAGNOSIS — N186 End stage renal disease: Secondary | ICD-10-CM | POA: Diagnosis not present

## 2018-03-04 ENCOUNTER — Ambulatory Visit: Payer: BLUE CROSS/BLUE SHIELD | Admitting: Cardiology

## 2018-03-04 ENCOUNTER — Encounter: Payer: Self-pay | Admitting: Cardiology

## 2018-03-04 VITALS — BP 162/78 | HR 94 | Ht 72.0 in | Wt 258.4 lb

## 2018-03-04 DIAGNOSIS — I5022 Chronic systolic (congestive) heart failure: Secondary | ICD-10-CM

## 2018-03-04 DIAGNOSIS — I110 Hypertensive heart disease with heart failure: Secondary | ICD-10-CM

## 2018-03-04 DIAGNOSIS — I2510 Atherosclerotic heart disease of native coronary artery without angina pectoris: Secondary | ICD-10-CM

## 2018-03-04 DIAGNOSIS — I5043 Acute on chronic combined systolic (congestive) and diastolic (congestive) heart failure: Secondary | ICD-10-CM

## 2018-03-04 DIAGNOSIS — N186 End stage renal disease: Secondary | ICD-10-CM | POA: Diagnosis not present

## 2018-03-04 DIAGNOSIS — Z95810 Presence of automatic (implantable) cardiac defibrillator: Secondary | ICD-10-CM

## 2018-03-04 DIAGNOSIS — N2581 Secondary hyperparathyroidism of renal origin: Secondary | ICD-10-CM | POA: Diagnosis not present

## 2018-03-04 DIAGNOSIS — E785 Hyperlipidemia, unspecified: Secondary | ICD-10-CM

## 2018-03-04 MED ORDER — IVABRADINE HCL 5 MG PO TABS: 5.0000 mg | ORAL_TABLET | Freq: Two times a day (BID) | ORAL | 2 refills | Status: AC

## 2018-03-04 MED ORDER — CARVEDILOL 6.25 MG PO TABS: 6.2500 mg | ORAL_TABLET | Freq: Two times a day (BID) | ORAL | 2 refills | Status: AC

## 2018-03-04 MED ORDER — ISOSORBIDE MONONITRATE ER 30 MG PO TB24: 30.0000 mg | ORAL_TABLET | Freq: Every day | ORAL | 2 refills | Status: AC

## 2018-03-04 MED ORDER — AMLODIPINE BESYLATE 5 MG PO TABS: 5.0000 mg | ORAL_TABLET | Freq: Every day | ORAL | 2 refills | Status: AC

## 2018-03-04 MED ORDER — CLONIDINE HCL 0.2 MG PO TABS: 0.2000 mg | ORAL_TABLET | Freq: Every day | ORAL | 3 refills | Status: AC

## 2018-03-04 MED ORDER — FUROSEMIDE 40 MG PO TABS: 40.0000 mg | ORAL_TABLET | Freq: Every day | ORAL | 3 refills | Status: AC

## 2018-03-04 MED ORDER — ROSUVASTATIN CALCIUM 20 MG PO TABS: 20.0000 mg | ORAL_TABLET | Freq: Every day | ORAL | 2 refills | Status: AC

## 2018-03-04 NOTE — Progress Notes (Signed)
Cardiology Office Note    Date:  03/04/2018  ID:  Austin Kramer, DOB 01-05-1961, MRN [id number] PCP:  Denita Lung, MD  Cardiologist:  Dr. Meda Coffee   Chief Complaint: f/u cardiomyopathy  History of Present Illness:  Austin Kramer is a 58 y.o. male with history of chronic systolic CHF/non-ischemic cardiomyopathy (mild nonobstructive CAD by cath 08/2016), DM type 2 (since his 74s), HTN, HLD, ESRD (placed on dialysis 07/2016), prostate CA s/p prostatectomy, GERD, peripheral neuropathy, pulmonary HTN, In 02/02/16 echo showed moderate LVH, EF 40-45%, RVH, mild LAE/RAE, mild MR, PASP 25mmHg, elevated CVP.  Right and left heart cath 09/01/16 showed 25% prox Cx, 25% prox RCA, EF 25-35%, moderate pulm HTN, mod elevated LVEDP. Recommendation was for intensification of CHF therapy as well as additional fluid removal at dialysis.   In 2019 echocardiogram showed LVEF 30 to 35% and he underwent placement of subcutaneous ICD because he is a dialysis patient.  He had one episode of falls ICD shock in August 2019 when his ICD was sensing T waves, this was reprogrammed and he has not had any ICD shocks since then.  03/04/2018 -today he states that he has been doing well, no more ICD shocks, no fever chills, minimal lower extremity edema that improved with compression socks and leg elevation.  His blood pressure has been elevated in the 160 range, on days of dialysis he takes his medications after dialysis so that his blood pressure does not lower during dialysis.  He denies any chest pain no orthopnea proximal nocturnal dyspnea, he tolerates his medications well.  Past Medical History:  Diagnosis Date  . Anemia   . CAD (coronary artery disease) 09/12/2016   R/L heart cath 09/01/16:  nl LAD, pLCx 25, pRCA 25, EF 25-35; Mean RA 8, PASP 60, mean PA 43, LVEDP 29  . Chronic systolic CHF (congestive heart failure) ([place])    Echo 12/17: EF 40-45 // L HC 7/18: EF 25-35, LVEDP 29  . Chronic systolic heart failure  ([place]) 06/21/2017  . Coronary artery disease involving native coronary artery of native heart without angina pectoris 09/12/2016   R/L heart cath 09/01/16:  nl LAD, pLCx 25, pRCA 25, EF 25-35; Mean RA 8, PASP 60, mean PA 43, LVEDP 29  . Diabetes mellitus   . Diabetic retinopathy associated with type 2 diabetes mellitus ([place]) 12/17/2012  . Dyspnea 07/23/2016  . ED (erectile dysfunction)   . ESRD on dialysis ([place])   . ESRD on hemodialysis ([place])    a. started HD 07/2016  . GERD (gastroesophageal reflux disease)    occasional  . Hyperlipidemia   . Hyperlipidemia LDL goal <70 03/27/2011  . Hypertension    [name]  . Hypertension associated with diabetes ([place]) 03/27/2011  . Hypertensive heart disease with chronic systolic congestive heart failure ([place]) 09/12/2016  . NICM (nonischemic cardiomyopathy) ([place])    a. Echo 12/17-mod LVH, EF 40-45, diff HK, diast+syst flattening, RVH, mild LAE/RAE, mild MR, PASP 45mmHg //  Nuclear stress test 01/2016: EF 42, no ischemia. // R/L Ht Cath 7/18: min cor plaque; elev LVEDP, elev PASP >> Echo 08/2017 with improvement in PASP (16mmHg>>52mmHg) and no change in EF at 30-35%  . Obesity   . Peripheral neuropathy    neuropathy, "tingling in feet"  . 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 PPD positive, treated   . Prostate cancer ([place])   . Pulmonary hypertension ([place])    severe PAH by 01/2016 echo  . Pulmonary hypertension (  [place])    a. PASP 94 by echo 01/2016. b. After initiation of dialysis, cath 08/2016 showed moderate pulm HTN.  Marland Kitchen Renal failure 06/10/2013  . Renal insufficiency 01/11/2012  . Type 2 diabetes mellitus with stage 5 chronic kidney disease not on chronic dialysis, with long-term current use of insulin ([place]) 03/27/2011  . Ureteritis 06/10/2013    Past Surgical History:  Procedure Laterality Date  . AMPUTATION  01/26/2012   Procedure: AMPUTATION RAY;  Surgeon: Newt Minion, MD;  Location: [facility];  Service: Orthopedics;  Laterality: Right;  Right foot 2nd ray  amputation  . AV FISTULA PLACEMENT Left 10/09/2014   Procedure: CREATION OF LEFT RADIOCEPHALIC ARTERIOVENOUS (AV) FISTULA ;  Surgeon: Serafina Mitchell, MD;  Location: [facility];  Service: Vascular;  Laterality: Left;  . COLONOSCOPY    . COLONOSCOPY WITH PROPOFOL N/A 11/05/2017   Procedure: COLONOSCOPY WITH PROPOFOL;  Surgeon: Ladene Artist, MD;  Location: [facility];  Service: Endoscopy;  Laterality: N/A;  . FRACTURE SURGERY     shoulder surgery  . KNEE CARTILAGE SURGERY    . PARS PLANA VITRECTOMY Left 12/17/2012   Procedure: LEFT PARS PLANA VITRECTOMY WITH 25 GAUGE/ENDO LASER;  Surgeon: Hurman Horn, MD;  Location: [facility];  Service: Ophthalmology;  Laterality: Left;  . PARS PLANA VITRECTOMY Right 02/05/2016   Procedure: PARS PLANA VITRECTOMY WITH 25 GAUGE;  Surgeon: Jalene Mullet, MD;  Location: [facility];  Service: Ophthalmology;  Laterality: Right;  with block  . PHOTOCOAGULATION WITH LASER Left 12/17/2012   Procedure: PHOTOCOAGULATION WITH LASER;  Surgeon: Hurman Horn, MD;  Location: [facility];  Service: Ophthalmology;  Laterality: Left;  . POLYPECTOMY  11/05/2017   Procedure: POLYPECTOMY;  Surgeon: Ladene Artist, MD;  Location: [facility];  Service: Endoscopy;;  . PROSTATECTOMY  2011  . RIGHT/LEFT HEART CATH AND CORONARY ANGIOGRAPHY N/A 09/01/2016   Procedure: Right/Left Heart Cath and Coronary Angiography;  Surgeon: Martinique, Peter M, MD;  Location: [facility];  Service: Cardiovascular;  Laterality: N/A;  . SUBQ ICD IMPLANT N/A 06/21/2017   Procedure: SUBQ ICD IMPLANT;  Surgeon: Evans Lance, MD;  Location: [facility];  Service: Cardiovascular;  Laterality: N/A;  . VASECTOMY      Current Medications: Current Meds  Medication Sig  . acetaminophen (TYLENOL) 500 MG tablet Take 1,000 mg by mouth daily as needed for moderate pain or headache.   Marland Kitchen amLODipine (NORVASC) 5 MG tablet Take 1 tablet (5 mg total) by mouth daily.  Marland Kitchen aspirin 81 MG tablet Take 81 mg by mouth daily.  .  calcitRIOL (ROCALTROL) 0.25 MCG capsule Take 0.25 mcg by mouth daily.  . calcium acetate (PHOSLO) 667 MG capsule Take 2 capsules (1,334 mg total) by mouth 3 (three) times daily with meals. (Patient taking differently: Take 2,001 mg by mouth 3 (three) times daily with meals. )  . carvedilol (COREG) 6.25 MG tablet Take 1 tablet (6.25 mg total) by mouth 2 (two) times daily.  . Cholecalciferol (VITAMIN D PO) Take 1 tablet by mouth daily.  . cloNIDine (CATAPRES) 0.2 MG tablet Take 1 tablet (0.2 mg total) by mouth at bedtime.  . fish oil-omega-3 fatty acids 1000 MG capsule Take 1 g by mouth daily.  . furosemide (LASIX) 40 MG tablet Take 1 tablet (40 mg total) by mouth daily.  . hydrALAZINE (APRESOLINE) 25 MG tablet TAKE 1 TABLET BY MOUTH THREE TIMES DAILY (Patient taking differently: Take 25 mg by mouth 3 (three) times daily. )  .  insulin detemir (LEVEMIR) 100 UNIT/ML injection Inject 0.1 mLs (10 Units total) into the skin 2 (two) times daily.  . insulin lispro (HUMALOG) 100 UNIT/ML injection Inject 7 Units into the skin 2 (two) times daily with a meal.   . ivabradine (CORLANOR) 5 MG TABS tablet Take 1 tablet (5 mg total) by mouth 2 (two) times daily with a meal.  . Misc Natural Products (TART CHERRY ADVANCED PO) Take 1 tablet by mouth daily.   Marland Kitchen MITIGARE 0.6 MG CAPS Take 0.6 mg by mouth daily.  . Multiple Minerals (CALCIUM/MAGNESIUM/ZINC PO) Take 1 tablet by mouth daily.   . Multiple Vitamin (MULTIVITAMIN WITH MINERALS) TABS Take 1 tablet by mouth daily.  . rosuvastatin (CRESTOR) 20 MG tablet Take 1 tablet (20 mg total) by mouth daily.  . vitamin B-12 (CYANOCOBALAMIN) 1000 MCG tablet Take 1,000 mcg by mouth daily.  Marland Kitchen VITAMIN E PO Take 1 capsule by mouth daily.  . [DISCONTINUED] amLODipine (NORVASC) 5 MG tablet Take 5 mg by mouth daily.  . [DISCONTINUED] carvedilol (COREG) 6.25 MG tablet Take 1 tablet (6.25 mg total) by mouth 2 (two) times daily. (Patient taking differently: Take 6.25 mg by mouth every  Monday, Wednesday, and Friday. )  . [DISCONTINUED] cloNIDine (CATAPRES) 0.2 MG tablet Take 0.2 mg by mouth at bedtime.  . [DISCONTINUED] furosemide (LASIX) 40 MG tablet Take 1 tablet (40 mg total) by mouth daily.  . [DISCONTINUED] isosorbide mononitrate (IMDUR) 30 MG 24 hr tablet TAKE 1/2 (ONE-HALF) TABLET BY MOUTH ONCE DAILY  . [DISCONTINUED] ivabradine (CORLANOR) 5 MG TABS tablet Take 1 tablet (5 mg total) by mouth 2 (two) times daily with a meal.  . [DISCONTINUED] rosuvastatin (CRESTOR) 20 MG tablet TAKE 1 TABLET BY MOUTH ONCE DAILY     Allergies:   Patient has no known allergies.   Social History   Socioeconomic History  . Marital status: Married    Spouse name: Not on file  . Number of children: 4  . Years of education: Not on file  . Highest education level: Not on file  Occupational History  . Occupation: Office manager  . Financial resource strain: Not on file  . Food insecurity:    Worry: Not on file    Inability: Not on file  . Transportation needs:    Medical: Not on file    Non-medical: Not on file  Tobacco Use  . Smoking status: Never Smoker  . Smokeless tobacco: Never Used  Substance and Sexual Activity  . Alcohol use: No  . Drug use: No  . Sexual activity: Yes  Lifestyle  . Physical activity:    Days per week: Not on file    Minutes per session: Not on file  . Stress: Not on file  Relationships  . Social connections:    Talks on phone: Not on file    Gets together: Not on file    Attends religious service: Not on file    Active member of club or organization: Not on file    Attends meetings of clubs or organizations: Not on file    Relationship status: Not on file  Other Topics Concern  . Not on file  Social History Narrative  . Not on file     Family History:  Family History  Problem Relation Age of Onset  . Diabetes Mother   . Diabetes Father   . Colon cancer Neg Hx   . Rectal cancer Neg Hx   . Esophageal cancer Neg Hx  ROS:   Please see the history of present illness.  All other systems are reviewed and otherwise negative.    PHYSICAL EXAM:   VS:  BP (!) 162/78   Pulse 94   Ht 6' (1.829 m)   Wt 258 lb 6.4 oz (117.2 kg)   SpO2 97%   BMI 35.05 kg/m   BMI: Body mass index is 35.05 kg/m. GEN: Well nourished, well developed obese AAM, in no acute distress  HEENT: normocephalic, atraumatic Neck: no JVD, carotid bruits, or masses Cardiac: RRR; no murmurs, rubs, or gallops, no edema  Respiratory:  clear to auscultation bilaterally, normal work of breathing GI: soft, nontender, nondistended, + BS MS: no deformity or atrophy  Skin: warm and dry, no rash Neuro:  Alert and Oriented x 3, Strength and sensation are intact, follows commands Psych: euthymic mood, full affect  Wt Readings from Last 3 Encounters:  03/04/18 258 lb 6.4 oz (117.2 kg)  11/05/17 245 lb (111.1 kg)  10/02/17 247 lb (112 kg)      Studies/Labs Reviewed:    EKG:   EKG was not ordered today  Recent Labs: 04/19/2017: ALT 15; Magnesium 2.0; TSH 0.421 06/12/2017: BUN 40; Creatinine, Ser 7.10; Platelets 247 11/05/2017: Hemoglobin 12.6; Potassium 4.6; Sodium 136   Lipid Panel    Component Value Date/Time   CHOL 171 11/25/2015 1616   TRIG 107 11/25/2015 1616   HDL 37 (L) 11/25/2015 1616   CHOLHDL 4.6 11/25/2015 1616   VLDL 21 11/25/2015 1616   LDLCALC 113 11/25/2015 1616    Additional studies/ records that were reviewed today include: Summarized above.   ASSESSMENT & PLAN:   1. Acute on chronic systolic CHF/NICM, LVEF 30 to 35%-  1. Continue to same medical management with hydralazine and Imdur, unable to use Entresto given his underlying stage V kidney disease on hemodialysis, continue carvedilol  2. He is euvolemic, volume controlled by hemodialysis, and Lasix 40 mg daily, he is also educated on necessity of low-sodium diet. 3. ICD sub Q -installed by Dr. Lovena Le, he had one inappropriate ICD shock, ICD was reprogrammed  and now functioning well 2. Mild CAD - no recent angina. Continue present regimen with aspirin, carvedilol and Crestor. He is tolerating it well. 3. Essential HTN -I will increase Imdur to 30 mg daily. 4. Pulmonary HTN -most recent echocardiogram from July 2019 showed improvement of RVSP from 94 to 52 mmHg. 5. 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  Follow-up in 3 months.  Medication Adjustments/Labs and Tests Ordered: Current medicines are reviewed at length with the patient today.  Concerns regarding medicines are outlined above. Medication changes, Labs and Tests ordered today are summarized above and listed in the Patient Instructions accessible in Encounters.   Signed, Ena Dawley, MD  03/04/2018 5:01 PM    [facility] [place], [place], [place]  [address] Phone: [phone]; Fax: [phone]

## 2018-03-04 NOTE — Patient Instructions (Addendum)
Medication Instructions:   INCREASE YOUR IMDUR (ISOSORBIDE MONONITRATE) TO 30 MG ONCE DAILY  If you need a refill on your cardiac medications before your next appointment, please call your pharmacy.      Follow-Up:  3 MONTHS WITH DR NELSON--FOLLOW-UP WITH DR Meda Coffee ON Thursday 06/06/2018 AT 3:20 PM.

## 2018-03-06 DIAGNOSIS — N2581 Secondary hyperparathyroidism of renal origin: Secondary | ICD-10-CM | POA: Diagnosis not present

## 2018-03-06 DIAGNOSIS — N186 End stage renal disease: Secondary | ICD-10-CM | POA: Diagnosis not present

## 2018-03-07 DIAGNOSIS — E113512 Type 2 diabetes mellitus with proliferative diabetic retinopathy with macular edema, left eye: Secondary | ICD-10-CM | POA: Diagnosis not present

## 2018-03-08 DIAGNOSIS — N2581 Secondary hyperparathyroidism of renal origin: Secondary | ICD-10-CM | POA: Diagnosis not present

## 2018-03-08 DIAGNOSIS — N186 End stage renal disease: Secondary | ICD-10-CM | POA: Diagnosis not present

## 2018-03-11 DIAGNOSIS — R51 Headache: Secondary | ICD-10-CM | POA: Diagnosis not present

## 2018-03-11 DIAGNOSIS — N2581 Secondary hyperparathyroidism of renal origin: Secondary | ICD-10-CM | POA: Diagnosis not present

## 2018-03-11 DIAGNOSIS — R52 Pain, unspecified: Secondary | ICD-10-CM | POA: Diagnosis not present

## 2018-03-11 DIAGNOSIS — N186 End stage renal disease: Secondary | ICD-10-CM | POA: Diagnosis not present

## 2018-03-13 DIAGNOSIS — N186 End stage renal disease: Secondary | ICD-10-CM | POA: Diagnosis not present

## 2018-03-13 DIAGNOSIS — R52 Pain, unspecified: Secondary | ICD-10-CM | POA: Diagnosis not present

## 2018-03-13 DIAGNOSIS — N2581 Secondary hyperparathyroidism of renal origin: Secondary | ICD-10-CM | POA: Diagnosis not present

## 2018-03-13 DIAGNOSIS — R51 Headache: Secondary | ICD-10-CM | POA: Diagnosis not present

## 2018-03-14 DIAGNOSIS — N186 End stage renal disease: Secondary | ICD-10-CM | POA: Diagnosis not present

## 2018-03-14 DIAGNOSIS — I871 Compression of vein: Secondary | ICD-10-CM | POA: Diagnosis not present

## 2018-03-14 DIAGNOSIS — T82858A Stenosis of vascular prosthetic devices, implants and grafts, initial encounter: Secondary | ICD-10-CM | POA: Diagnosis not present

## 2018-03-14 DIAGNOSIS — Z992 Dependence on renal dialysis: Secondary | ICD-10-CM | POA: Diagnosis not present

## 2018-03-15 ENCOUNTER — Other Ambulatory Visit: Payer: Self-pay | Admitting: Family Medicine

## 2018-03-15 ENCOUNTER — Other Ambulatory Visit: Payer: Self-pay

## 2018-03-15 ENCOUNTER — Emergency Department (HOSPITAL_COMMUNITY)
Admission: EM | Admit: 2018-03-15 | Discharge: 2018-03-15 | Disposition: A | Discharge status: Home / Self Care | Payer: BLUE CROSS/BLUE SHIELD | Attending: Emergency Medicine | Admitting: Emergency Medicine

## 2018-03-15 ENCOUNTER — Emergency Department (HOSPITAL_COMMUNITY): Payer: BLUE CROSS/BLUE SHIELD

## 2018-03-15 ENCOUNTER — Encounter (HOSPITAL_COMMUNITY): Payer: Self-pay | Admitting: Emergency Medicine

## 2018-03-15 DIAGNOSIS — R52 Pain, unspecified: Secondary | ICD-10-CM | POA: Diagnosis not present

## 2018-03-15 DIAGNOSIS — E11319 Type 2 diabetes mellitus with unspecified diabetic retinopathy without macular edema: Secondary | ICD-10-CM | POA: Insufficient documentation

## 2018-03-15 DIAGNOSIS — N186 End stage renal disease: Secondary | ICD-10-CM | POA: Diagnosis not present

## 2018-03-15 DIAGNOSIS — I132 Hypertensive heart and chronic kidney disease with heart failure and with stage 5 chronic kidney disease, or end stage renal disease: Secondary | ICD-10-CM | POA: Diagnosis not present

## 2018-03-15 DIAGNOSIS — M25511 Pain in right shoulder: Secondary | ICD-10-CM | POA: Diagnosis not present

## 2018-03-15 DIAGNOSIS — Z7982 Long term (current) use of aspirin: Secondary | ICD-10-CM | POA: Insufficient documentation

## 2018-03-15 DIAGNOSIS — I5022 Chronic systolic (congestive) heart failure: Secondary | ICD-10-CM | POA: Diagnosis not present

## 2018-03-15 DIAGNOSIS — I259 Chronic ischemic heart disease, unspecified: Secondary | ICD-10-CM | POA: Insufficient documentation

## 2018-03-15 DIAGNOSIS — E1322 Other specified diabetes mellitus with diabetic chronic kidney disease: Secondary | ICD-10-CM | POA: Diagnosis not present

## 2018-03-15 DIAGNOSIS — M79601 Pain in right arm: Secondary | ICD-10-CM | POA: Diagnosis not present

## 2018-03-15 DIAGNOSIS — Z79899 Other long term (current) drug therapy: Secondary | ICD-10-CM | POA: Diagnosis not present

## 2018-03-15 DIAGNOSIS — Z992 Dependence on renal dialysis: Secondary | ICD-10-CM | POA: Diagnosis not present

## 2018-03-15 DIAGNOSIS — E1129 Type 2 diabetes mellitus with other diabetic kidney complication: Secondary | ICD-10-CM | POA: Diagnosis not present

## 2018-03-15 DIAGNOSIS — Z794 Long term (current) use of insulin: Secondary | ICD-10-CM | POA: Diagnosis not present

## 2018-03-15 DIAGNOSIS — R51 Headache: Secondary | ICD-10-CM | POA: Diagnosis not present

## 2018-03-15 DIAGNOSIS — N2581 Secondary hyperparathyroidism of renal origin: Secondary | ICD-10-CM | POA: Diagnosis not present

## 2018-03-15 MED ORDER — HYDROCODONE-ACETAMINOPHEN 5-325 MG PO TABS: 2.0000 | ORAL_TABLET | ORAL | 0 refills | Status: AC | PRN

## 2018-03-15 MED ORDER — HYDROCODONE-ACETAMINOPHEN 5-325 MG PO TABS
1.0000 | ORAL_TABLET | Freq: Once | ORAL | Status: AC
  Administered 2018-03-15: 1 via ORAL
  Filled 2018-03-15: qty 1

## 2018-03-15 NOTE — Discharge Instructions (Signed)
Return for any problem.  Follow-up with your regular care provider as instructed.  Please follow-up with orthopedics as instructed.

## 2018-03-15 NOTE — ED Provider Notes (Signed)
[facility] DEPT Provider Note   CSN: [id number] Arrival date & time: 03/15/18  1017     History   Chief Complaint Chief Complaint  Patient presents with  . Arm Pain    HPI Austin Kramer is a 58 y.o. male.  58 year old male with prior medical history as detailed below presents for evaluation of right shoulder pain.  Patient reports that he does heavy lifting on a regular basis at work.  Patient reports that over the last day he is noticed increased pain to the right shoulder.  This pain became worse over the course of this morning.  Patient denies specific inciting injury.  He denies fall.  He denies associated fever.  He did not take anything today for his pain.  His pain is worse with movement of the right shoulder.  The history is provided by the patient and medical records.  Shoulder Pain  Location:  Shoulder Shoulder location:  R shoulder Injury: no   Pain details:    Quality:  Aching   Radiates to:  Does not radiate   Severity:  Moderate   Onset quality:  Gradual   Duration:  1 day   Timing:  Constant   Progression:  Worsening Handedness:  Right-handed Dislocation: no   Foreign body present:  No foreign bodies Prior injury to area:  Yes Relieved by:  Nothing Worsened by:  Nothing Associated symptoms: no fever, no neck pain, no numbness and no tingling     Past Medical History:  Diagnosis Date  . Anemia   . CAD (coronary artery disease) 09/12/2016   R/L heart cath 09/01/16:  nl LAD, pLCx 25, pRCA 25, EF 25-35; Mean RA 8, PASP 60, mean PA 43, LVEDP 29  . Chronic systolic CHF (congestive heart failure) ([place])    Echo 12/17: EF 40-45 // L HC 7/18: EF 25-35, LVEDP 29  . Chronic systolic heart failure ([place]) 06/21/2017  . Coronary artery disease involving native coronary artery of native heart without angina pectoris 09/12/2016   R/L heart cath 09/01/16:  nl LAD, pLCx 25, pRCA 25, EF 25-35; Mean RA 8, PASP 60, mean PA 43, LVEDP 29  .  Diabetes mellitus   . Diabetic retinopathy associated with type 2 diabetes mellitus ([place]) 12/17/2012  . Dyspnea 07/23/2016  . ED (erectile dysfunction)   . ESRD on dialysis ([place])   . ESRD on hemodialysis ([place])    a. started HD 07/2016  . GERD (gastroesophageal reflux disease)    occasional  . Hyperlipidemia   . Hyperlipidemia LDL goal <70 03/27/2011  . Hypertension    [name]  . Hypertension associated with diabetes ([place]) 03/27/2011  . Hypertensive heart disease with chronic systolic congestive heart failure ([place]) 09/12/2016  . NICM (nonischemic cardiomyopathy) ([place])    a. Echo 12/17-mod LVH, EF 40-45, diff HK, diast+syst flattening, RVH, mild LAE/RAE, mild MR, PASP 67mmHg //  Nuclear stress test 01/2016: EF 42, no ischemia. // R/L Ht Cath 7/18: min cor plaque; elev LVEDP, elev PASP >> Echo 08/2017 with improvement in PASP (39mmHg>>52mmHg) and no change in EF at 30-35%  . Obesity   . Peripheral neuropathy    neuropathy, "tingling in feet"  . 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 PPD positive, treated   . Prostate cancer ([place])   . Pulmonary hypertension ([place])    severe PAH by 01/2016 echo  . Pulmonary hypertension ([place])    a. PASP 94 by echo 01/2016. b. After initiation of dialysis,  cath 08/2016 showed moderate pulm HTN.  Marland Kitchen Renal failure 06/10/2013  . Renal insufficiency 01/11/2012  . Type 2 diabetes mellitus with stage 5 chronic kidney disease not on chronic dialysis, with long-term current use of insulin ([place]) 03/27/2011  . Ureteritis 06/10/2013    Patient Active Problem List   Diagnosis Date Noted  . Screening for colorectal cancer   . Benign neoplasm of descending colon   . Chronic systolic heart failure ([place]) 06/21/2017  . Coronary artery disease involving native coronary artery of native heart without angina pectoris 09/12/2016  . NICM (nonischemic cardiomyopathy) ([place]) 09/12/2016  . Hypertensive heart disease with chronic systolic congestive heart failure ([place]) 09/12/2016  .  Chronic systolic CHF (congestive heart failure) ([place]) 09/01/2016  . ESRD on dialysis ([place])   . Anemia   . Dyspnea 07/23/2016  . Elevated LFTs   . Right hand pain 04/07/2016  . Swelling of right hand 04/07/2016  . Ureteritis 06/10/2013  . Renal failure 06/10/2013  . Diabetic retinopathy associated with type 2 diabetes mellitus ([place]) 12/17/2012  . 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 Hyponatremia 01/11/2012  . Hypokalemia 01/11/2012  . Renal insufficiency 01/11/2012  . Cellulitis 01/10/2012  . Leukocytosis 01/10/2012  . Type 2 diabetes mellitus with stage 5 chronic kidney disease not on chronic dialysis, with long-term current use of insulin ([place]) 03/27/2011  . Hypertension associated with diabetes ([place]) 03/27/2011  . Hyperlipidemia LDL goal <70 03/27/2011  . Obesity (BMI 30-39.9) 03/27/2011    Past Surgical History:  Procedure Laterality Date  . AMPUTATION  01/26/2012   Procedure: AMPUTATION RAY;  Surgeon: Newt Minion, MD;  Location: [facility];  Service: Orthopedics;  Laterality: Right;  Right foot 2nd ray amputation  . AV FISTULA PLACEMENT Left 10/09/2014   Procedure: CREATION OF LEFT RADIOCEPHALIC ARTERIOVENOUS (AV) FISTULA ;  Surgeon: Serafina Mitchell, MD;  Location: [facility];  Service: Vascular;  Laterality: Left;  . COLONOSCOPY    . COLONOSCOPY WITH PROPOFOL N/A 11/05/2017   Procedure: COLONOSCOPY WITH PROPOFOL;  Surgeon: Ladene Artist, MD;  [facility];  Service: Endoscopy;  Laterality: N/A;  . FRACTURE SURGERY     shoulder surgery  . KNEE CARTILAGE SURGERY    . PARS PLANA VITRECTOMY Left 12/17/2012   Procedure: LEFT PARS PLANA VITRECTOMY WITH 25 GAUGE/ENDO LASER;  Surgeon: Hurman Horn, MD;  Location: [facility];  Service: Ophthalmology;  Laterality: Left;  . PARS PLANA VITRECTOMY Right 02/05/2016   Procedure: PARS PLANA VITRECTOMY WITH 25 GAUGE;  Surgeon: Jalene Mullet, MD;  Location: [facility];  Service: Ophthalmology;  Laterality: Right;  with block  . PHOTOCOAGULATION WITH LASER  Left 12/17/2012   Procedure: PHOTOCOAGULATION WITH LASER;  Surgeon: Hurman Horn, MD;  Location: [facility];  Service: Ophthalmology;  Laterality: Left;  . POLYPECTOMY  11/05/2017   Procedure: POLYPECTOMY;  Surgeon: Ladene Artist, MD;  [facility];  Service: Endoscopy;;  . PROSTATECTOMY  2011  . RIGHT/LEFT HEART CATH AND CORONARY ANGIOGRAPHY N/A 09/01/2016   Procedure: Right/Left Heart Cath and Coronary Angiography;  Surgeon: Martinique, Aurielle Slingerland M, MD;  Location: [facility];  Service: Cardiovascular;  Laterality: N/A;  . SUBQ ICD IMPLANT N/A 06/21/2017   Procedure: SUBQ ICD IMPLANT;  Surgeon: Evans Lance, MD;  Location: [facility];  Service: Cardiovascular;  Laterality: N/A;  . VASECTOMY          Home Medications    Prior to Admission medications   Medication Sig Start Date End Date  Taking? Authorizing Provider  acetaminophen (TYLENOL) 500 MG tablet Take 1,000 mg by mouth daily as needed for moderate pain or headache.    Yes [provider]  amLODipine (NORVASC) 5 MG tablet Take 1 tablet (5 mg total) by mouth daily. 03/04/18  Yes Dorothy Spark, MD  aspirin 81 MG tablet Take 81 mg by mouth daily.   Yes [provider]  calcitRIOL (ROCALTROL) 0.25 MCG capsule Take 0.25 mcg by mouth daily.   Yes [provider]  calcium acetate (PHOSLO) 667 MG capsule Take 2 capsules (1,334 mg total) by mouth 3 (three) times daily with meals. Patient taking differently: Take 2,001 mg by mouth 3 (three) times daily with meals.  07/26/16  Yes Orson Eva J, DO  carvedilol (COREG) 6.25 MG tablet Take 1 tablet (6.25 mg total) by mouth 2 (two) times daily. 03/04/18  Yes Dorothy Spark, MD  Cholecalciferol (VITAMIN D PO) Take 1 tablet by mouth daily.   Yes [provider]  cloNIDine (CATAPRES) 0.2 MG tablet Take 1 tablet (0.2 mg total) by mouth at bedtime. 03/04/18  Yes Dorothy Spark, MD  fish oil-omega-3 fatty acids 1000 MG capsule Take 1 g by mouth  daily.   Yes [provider]  furosemide (LASIX) 40 MG tablet Take 1 tablet (40 mg total) by mouth daily. 03/04/18  Yes Dorothy Spark, MD  hydrALAZINE (APRESOLINE) 25 MG tablet TAKE 1 TABLET BY MOUTH THREE TIMES DAILY Patient taking differently: Take 25 mg by mouth 3 (three) times daily.  09/17/17  Yes Weaver, Scott T, PA-C  insulin detemir (LEVEMIR) 100 UNIT/ML injection Inject 0.1 mLs (10 Units total) into the skin 2 (two) times daily. 07/26/16  Yes Orson Eva J, DO  insulin lispro (HUMALOG) 100 UNIT/ML injection Inject 7 Units into the skin 2 (two) times daily with a meal.    Yes [provider]  isosorbide mononitrate (IMDUR) 30 MG 24 hr tablet Take 1 tablet (30 mg total) by mouth daily. 03/04/18  Yes Dorothy Spark, MD  ivabradine (CORLANOR) 5 MG TABS tablet Take 1 tablet (5 mg total) by mouth 2 (two) times daily with a meal. 03/04/18  Yes Dorothy Spark, MD  Menthol, Topical Analgesic, (ICY HOT) 7.5 % (Roll) MISC Apply 1 each topically as needed (muscle pain). Icy hot roll on   Yes [provider]  Misc Natural Products (TART CHERRY ADVANCED PO) Take 1 tablet by mouth daily.    Yes [provider]  MITIGARE 0.6 MG CAPS Take 0.6 mg by mouth daily. 10/01/17  Yes [provider]  Multiple Vitamin (MULTIVITAMIN WITH MINERALS) TABS Take 1 tablet by mouth daily.   Yes [provider]  rosuvastatin (CRESTOR) 20 MG tablet Take 1 tablet (20 mg total) by mouth daily. 03/04/18  Yes Dorothy Spark, MD  vitamin B-12 (CYANOCOBALAMIN) 1000 MCG tablet Take 1,000 mcg by mouth daily.   Yes [provider]  VITAMIN E PO Take 1 capsule by mouth daily.   Yes [provider]    Family History Family History  Problem Relation Age of Onset  . Diabetes Mother   . Diabetes Father   . Colon cancer Neg Hx   . Rectal cancer Neg Hx   . Esophageal cancer Neg Hx     Social History Social History   Tobacco Use  . Smoking status:  Never Smoker  . Smokeless tobacco: Never Used  Substance Use Topics  . Alcohol use: No  .  Drug use: No     Allergies   Patient has no known allergies.   Review of Systems Review of Systems  Constitutional: Negative for fever.  Musculoskeletal: Negative for neck pain.  All other systems reviewed and are negative.    Physical Exam Updated Vital Signs BP (!) 199/90   Pulse 90   Temp 98.7 F (37.1 C) (Oral)   Resp (!) 22   Ht 6' (1.829 m)   Wt 111.1 kg   SpO2 98%   BMI 33.23 kg/m   Physical Exam Vitals signs and nursing note reviewed.  Constitutional:      General: He is not in acute distress.    Appearance: He is well-developed.  HENT:     Head: Normocephalic and atraumatic.  Eyes:     Conjunctiva/sclera: Conjunctivae normal.     Pupils: Pupils are equal, round, and reactive to light.  Neck:     Musculoskeletal: Normal range of motion and neck supple.  Cardiovascular:     Rate and Rhythm: Normal rate and regular rhythm.     Heart sounds: Normal heart sounds.  Pulmonary:     Effort: Pulmonary effort is normal. No respiratory distress.     Breath sounds: Normal breath sounds.  Abdominal:     General: There is no distension.     Palpations: Abdomen is soft.     Tenderness: There is no abdominal tenderness.  Musculoskeletal:        General: Tenderness present. No swelling or deformity.     Comments: Mild tenderness to palpation over the lateral and posterior aspect of the right shoulder.  No appreciable erythema or effusion noted to the right shoulder.  Distal right upper extremity neurovascular intact.  No appreciable deformity or dislocation appreciated.  Skin:    General: Skin is warm and dry.  Neurological:     Mental Status: He is alert and oriented to person, place, and time.      ED Treatments / Results  Labs (all labs ordered are listed, but only abnormal results are displayed) Labs Reviewed - No data to display  EKG None  Radiology Dg  Shoulder Right  Result Date: 03/15/2018 CLINICAL DATA:  Per pt, states he is having extreme right arm pain x 1 wk-states no injury- previous shoulder surgery distantly. Unable to raise arm for axillary view.pain post lifting EXAM: RIGHT SHOULDER - 2+ VIEW COMPARISON:  None. FINDINGS: Screw fixation through the superior aspect of the glenoid fossa. There is spurring along the inferior margin of the glenoid fossa as well as the inferior margin of the humeral head. There is calcific tendinitis superior aspect of the RIGHT shoulder joint. IMPRESSION: 1. No acute findings. 2. 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 Electronically Signed   By: Suzy Bouchard M.D.   On: 03/15/2018 11:35    Procedures Procedures (including critical care time)  Medications Ordered in ED Medications  HYDROcodone-acetaminophen (NORCO/VICODIN) 5-325 MG per tablet 1 tablet (1 tablet Oral Given 03/15/18 1046)     Initial Impression / Assessment and Plan / ED Course  I have reviewed the triage vital signs and the nursing notes.  Pertinent labs & imaging results that were available during my care of the patient were reviewed by me and considered in my medical decision making (see chart for details).     MDM  Screen complete  Patient is presenting for evaluation of right shoulder pain.  Patient's described symptoms are consistent with likely strain  and inflammation related to his job where he lifts heavy items daily.  Patient's x-ray does demonstrate osteoarthritis in the right shoulder joint.  No evidence of fracture on Xray.  Patient does feel improved following administration of Norco in the ED.  Importance of close follow-up was stressed.  Strict return precautions given and understood.  Final Clinical Impressions(s) / ED Diagnoses   Final diagnoses:  Acute pain of right shoulder    ED Discharge Orders         Ordered    HYDROcodone-acetaminophen  (NORCO/VICODIN) 5-325 MG tablet  Every 4 hours PRN     03/15/18 1150           Valarie Merino, MD 03/15/18 1151

## 2018-03-15 NOTE — ED Triage Notes (Signed)
Per pt, states he is having extreme right arm pain-states no injury-states he is a dialysis patient-denies chest pain

## 2018-03-15 NOTE — Telephone Encounter (Signed)
Pt was advised he needed an appt since it has been since 2018 since he was seen for. Pt will call back tro schedule something at a later date. I will deny this

## 2018-03-18 ENCOUNTER — Ambulatory Visit (INDEPENDENT_AMBULATORY_CARE_PROVIDER_SITE_OTHER): Payer: BLUE CROSS/BLUE SHIELD | Admitting: Family Medicine

## 2018-03-18 ENCOUNTER — Encounter (INDEPENDENT_AMBULATORY_CARE_PROVIDER_SITE_OTHER): Payer: Self-pay | Admitting: Family Medicine

## 2018-03-18 DIAGNOSIS — M25511 Pain in right shoulder: Secondary | ICD-10-CM | POA: Diagnosis not present

## 2018-03-18 DIAGNOSIS — R52 Pain, unspecified: Secondary | ICD-10-CM | POA: Diagnosis not present

## 2018-03-18 DIAGNOSIS — N186 End stage renal disease: Secondary | ICD-10-CM | POA: Diagnosis not present

## 2018-03-18 DIAGNOSIS — N2581 Secondary hyperparathyroidism of renal origin: Secondary | ICD-10-CM | POA: Diagnosis not present

## 2018-03-18 MED ORDER — METHYLPREDNISOLONE ACETATE 40 MG/ML IJ SUSP: 40.0000 mg | Freq: Once | INTRAMUSCULAR | Status: AC

## 2018-03-18 MED ORDER — OXYCODONE-ACETAMINOPHEN 5-325 MG PO TABS: 1.0000 | ORAL_TABLET | ORAL | 0 refills | Status: AC | PRN

## 2018-03-18 NOTE — Progress Notes (Signed)
Office Visit Note   Patient: Austin Kramer           Date of Birth: September 25, 1960           MRN: [id number] Visit Date: 03/18/2018 Requested by: Denita Lung, MD [place], [place] [address] PCP: Denita Lung, MD  Subjective: Chief Complaint  Patient presents with  . Right Shoulder - Pain    Aching in shoulder - started last week. Not sleeping. In a sling from the ED (03/15/2018).    HPI: He is a 58 year old right-hand-dominant male with right shoulder pain.  Symptoms started about a week ago, no injury.  Gradual worsening pain in his shoulder to the point that he can hardly move it.  He went to the ER and was given pain medicines which have not helped.  He has been in severe pain.  He has a history of multiple dislocations in the past with reconstructive surgery.  He is a former Pharmacist, community.  His job is very physically demanding.              ROS: He has diabetes and is on dialysis.  He has a history of gout and coronary disease.  Objective: Vital Signs: There were no vitals taken for this visit.  Physical Exam:  Right shoulder: Very limited active range of motion because of pain.  Exquisite tenderness in the lateral subacromial space.  No tenderness at the [facility] joint, did not put him through active range of motion.  Imaging: None today but recent x-rays from the hospital show bone-on-bone glenohumeral DJD  Assessment & Plan: 1.  Right shoulder pain with underlying glenohumeral DJD, cannot rule out partial rotator cuff tear or gouty arthropathy. -Discussed options with patient and elected to try a subacromial injection.  If no improvement then possibly further imaging.   Follow-Up Instructions: Return if symptoms worsen or fail to improve.      Procedures: Right shoulder subacromial injection: After sterile prep with Betadine, injected 5 cc 1% lidocaine without epinephrine and 40 mg methylprednisolone from posterior approach into the subacromial  space.   PMFS History: Patient Active Problem List   Diagnosis Date Noted  . Screening for colorectal cancer   . Benign neoplasm of descending colon   . Chronic systolic heart failure ([place]) 06/21/2017  . Coronary artery disease involving native coronary artery of native heart without angina pectoris 09/12/2016  . NICM (nonischemic cardiomyopathy) ([place]) 09/12/2016  . Hypertensive heart disease with chronic systolic congestive heart failure ([place]) 09/12/2016  . Chronic systolic CHF (congestive heart failure) ([place]) 09/01/2016  . ESRD on dialysis ([place])   . Anemia   . Dyspnea 07/23/2016  . Elevated LFTs   . Right hand pain 04/07/2016  . Swelling of right hand 04/07/2016  . Ureteritis 06/10/2013  . Renal failure 06/10/2013  . Diabetic retinopathy associated with type 2 diabetes mellitus ([place]) 12/17/2012  . 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 Hyponatremia 01/11/2012  . Hypokalemia 01/11/2012  . Renal insufficiency 01/11/2012  . Cellulitis 01/10/2012  . Leukocytosis 01/10/2012  . Type 2 diabetes mellitus with stage 5 chronic kidney disease not on chronic dialysis, with long-term current use of insulin ([place]) 03/27/2011  . Hypertension associated with diabetes ([place]) 03/27/2011  . Hyperlipidemia LDL goal <70 03/27/2011  . Obesity (BMI 30-39.9) 03/27/2011   Past Medical History:  Diagnosis Date  . Anemia   . CAD (coronary artery disease) 09/12/2016   R/L heart cath 09/01/16:  nl LAD, pLCx 25, pRCA  25, EF 25-35; Mean RA 8, PASP 60, mean PA 43, LVEDP 29  . Chronic systolic CHF (congestive heart failure) ([place])    Echo 12/17: EF 40-45 // L HC 7/18: EF 25-35, LVEDP 29  . Chronic systolic heart failure ([place]) 06/21/2017  . Coronary artery disease involving native coronary artery of native heart without angina pectoris 09/12/2016   R/L heart cath 09/01/16:  nl LAD, pLCx 25, pRCA 25, EF 25-35; Mean RA 8, PASP 60, mean PA 43, LVEDP 29  . Diabetes mellitus   . Diabetic retinopathy associated with type 2  diabetes mellitus ([place]) 12/17/2012  . Dyspnea 07/23/2016  . ED (erectile dysfunction)   . ESRD on dialysis ([place])   . ESRD on hemodialysis ([place])    a. started HD 07/2016  . GERD (gastroesophageal reflux disease)    occasional  . Hyperlipidemia   . Hyperlipidemia LDL goal <70 03/27/2011  . Hypertension    [name]  . Hypertension associated with diabetes ([place]) 03/27/2011  . Hypertensive heart disease with chronic systolic congestive heart failure ([place]) 09/12/2016  . NICM (nonischemic cardiomyopathy) ([place])    a. Echo 12/17-mod LVH, EF 40-45, diff HK, diast+syst flattening, RVH, mild LAE/RAE, mild MR, PASP 43mmHg //  Nuclear stress test 01/2016: EF 42, no ischemia. // R/L Ht Cath 7/18: min cor plaque; elev LVEDP, elev PASP >> Echo 08/2017 with improvement in PASP (3mmHg>>52mmHg) and no change in EF at 30-35%  . Obesity   . Peripheral neuropathy    neuropathy, "tingling in feet"  . 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 PPD positive, treated   . Prostate cancer ([place])   . Pulmonary hypertension ([place])    severe PAH by 01/2016 echo  . Pulmonary hypertension ([place])    a. PASP 94 by echo 01/2016. b. After initiation of dialysis, cath 08/2016 showed moderate pulm HTN.  Marland Kitchen Renal failure 06/10/2013  . Renal insufficiency 01/11/2012  . 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 Ureteritis 06/10/2013    Family History  Problem Relation Age of Onset  . Diabetes Mother   . Diabetes Father   . Colon cancer Neg Hx   . Rectal cancer Neg Hx   . Esophageal cancer Neg Hx     Past Surgical History:  Procedure Laterality Date  . AMPUTATION  01/26/2012   Procedure: AMPUTATION RAY;  Surgeon: Newt Minion, MD;  Location: [facility];  Service: Orthopedics;  Laterality: Right;  Right foot 2nd ray amputation  . AV FISTULA PLACEMENT Left 10/09/2014   Procedure: CREATION OF LEFT RADIOCEPHALIC ARTERIOVENOUS (AV) FISTULA ;   Surgeon: Serafina Mitchell, MD;  Location: [facility];  Service: Vascular;  Laterality: Left;  . COLONOSCOPY    . COLONOSCOPY WITH PROPOFOL N/A 11/05/2017   Procedure: COLONOSCOPY WITH PROPOFOL;  Surgeon: Ladene Artist, MD;  [facility];  Service: Endoscopy;  Laterality: N/A;  . FRACTURE SURGERY     shoulder surgery  . KNEE CARTILAGE SURGERY    . PARS PLANA VITRECTOMY Left 12/17/2012   Procedure: LEFT PARS PLANA VITRECTOMY WITH 25 GAUGE/ENDO LASER;  Surgeon: Hurman Horn, MD;  Location: [facility];  Service: Ophthalmology;  Laterality: Left;  . PARS PLANA VITRECTOMY Right 02/05/2016   Procedure: PARS PLANA VITRECTOMY WITH 25 GAUGE;  Surgeon: Jalene Mullet, MD;  Location: [facility];  Service: Ophthalmology;  Laterality: Right;  with block  . PHOTOCOAGULATION WITH LASER  Left 12/17/2012   Procedure: PHOTOCOAGULATION WITH LASER;  Surgeon: Hurman Horn, MD;  Location: [facility];  Service: Ophthalmology;  Laterality: Left;  . POLYPECTOMY  11/05/2017   Procedure: POLYPECTOMY;  Surgeon: Ladene Artist, MD;  [facility];  Service: Endoscopy;;  . PROSTATECTOMY  2011  . RIGHT/LEFT HEART CATH AND CORONARY ANGIOGRAPHY N/A 09/01/2016   Procedure: Right/Left Heart Cath and Coronary Angiography;  Surgeon: Martinique, Peter M, MD;  Location: [facility];  Service: Cardiovascular;  Laterality: N/A;  . SUBQ ICD IMPLANT N/A 06/21/2017   Procedure: SUBQ ICD IMPLANT;  Surgeon: Evans Lance, MD;  Location: [facility];  Service: Cardiovascular;  Laterality: N/A;  . VASECTOMY     Social History   Occupational History  . Occupation: Proofreader  Tobacco Use  . Smoking status: Never Smoker  . Smokeless tobacco: Never Used  Substance and Sexual Activity  . Alcohol use: No  . Drug use: No  . Sexual activity: Yes

## 2018-03-18 NOTE — Addendum Note (Signed)
Addended by: Hortencia Pilar on: 03/18/2018 11:27 AM   Modules accepted: Orders

## 2018-03-22 DIAGNOSIS — I499 Cardiac arrhythmia, unspecified: Secondary | ICD-10-CM | POA: Diagnosis not present

## 2018-03-22 DIAGNOSIS — I469 Cardiac arrest, cause unspecified: Secondary | ICD-10-CM | POA: Diagnosis not present

## 2018-03-22 DIAGNOSIS — R404 Transient alteration of awareness: Secondary | ICD-10-CM | POA: Diagnosis not present

## 2018-03-22 DIAGNOSIS — E1165 Type 2 diabetes mellitus with hyperglycemia: Secondary | ICD-10-CM | POA: Diagnosis not present

## 2018-03-25 ENCOUNTER — Telehealth: Payer: Self-pay | Admitting: Cardiology

## 2018-03-25 NOTE — Telephone Encounter (Signed)
New message   Per Cecille Rubin at [facility], need death certificate to be signed. Please call to discuss.

## 2018-03-26 NOTE — Telephone Encounter (Signed)
[facility] will be dropping off death certificate.

## 2018-03-27 NOTE — Telephone Encounter (Signed)
Call came in from [facility] on 2/10, on this pt, in regards to having Dr Meda Coffee sign this pts death certificate.  Call came into our Medical Records dept on 2/10, and call wasn't made back to the [facility] until the following day on 2/11, by Med Records Rep Lorriane Shire. Only notation made in the pts chart was that [facility] will be dropping of this pts death certificate for Dr Meda Coffee to sign.  There was no message or telephone encounter sent to Dr Meda Coffee or myself, indicating that the pt passed and the funeral home will be bringing over his death certificate for her to sign.  Around 5 pm yesterday, noted in Dr Francesca Oman mailbox was the pts death certificate and a message stating "please sign." Showed this to Dr Meda Coffee and there is no notation in the pts chart that he was in the hospital and passed there.  There is only mention that he died in the home, and cause of death was not mentioned on the certificate.  Per Dr Meda Coffee, she cannot sign the pts death certificate for she does not know the cause of death, for he did die in the home.  Dr Meda Coffee and myself both tried calling the funeral home last night, to obtain more information on how we can assist with this, given very minimal information.  The funeral home was closed at that time.   We also tried reaching out to the pts wife to inquire how he passed, and she did not answer the phone.  I personally placed a call to [facility] this morning, and requested for the secretary to have the [employer] call our office back, and ask to speak with myself, to obtain more information on the deceased pt.

## 2018-03-28 ENCOUNTER — Encounter (HOSPITAL_COMMUNITY): Payer: Self-pay | Admitting: *Deleted

## 2018-03-28 ENCOUNTER — Telehealth: Payer: Self-pay | Admitting: Family Medicine

## 2018-03-28 NOTE — Telephone Encounter (Signed)
[facility] came in and dropped off death certificate. Sending back to be signed.

## 2018-04-14 DEATH — deceased

## 2018-06-06 ENCOUNTER — Ambulatory Visit: Payer: BLUE CROSS/BLUE SHIELD | Admitting: Cardiology
